# Patient Record
Sex: Female | Born: 1940 | Race: White | Hispanic: No | Marital: Married | Smoking: Former smoker
Health system: Southern US, Community
[De-identification: ages and names within clinical notes are randomized; demographics above are authoritative.]

## PROBLEM LIST (undated history)

## (undated) DIAGNOSIS — C801 Malignant (primary) neoplasm, unspecified: Secondary | ICD-10-CM

## (undated) DIAGNOSIS — M549 Dorsalgia, unspecified: Secondary | ICD-10-CM

## (undated) DIAGNOSIS — E785 Hyperlipidemia, unspecified: Secondary | ICD-10-CM

## (undated) DIAGNOSIS — M810 Age-related osteoporosis without current pathological fracture: Secondary | ICD-10-CM

## (undated) DIAGNOSIS — I482 Chronic atrial fibrillation, unspecified: Secondary | ICD-10-CM

## (undated) DIAGNOSIS — T7840XA Allergy, unspecified, initial encounter: Secondary | ICD-10-CM

## (undated) DIAGNOSIS — Z923 Personal history of irradiation: Secondary | ICD-10-CM

## (undated) DIAGNOSIS — I340 Nonrheumatic mitral (valve) insufficiency: Secondary | ICD-10-CM

## (undated) DIAGNOSIS — E559 Vitamin D deficiency, unspecified: Secondary | ICD-10-CM

## (undated) DIAGNOSIS — H903 Sensorineural hearing loss, bilateral: Secondary | ICD-10-CM

## (undated) DIAGNOSIS — I10 Essential (primary) hypertension: Secondary | ICD-10-CM

## (undated) DIAGNOSIS — I499 Cardiac arrhythmia, unspecified: Secondary | ICD-10-CM

## (undated) HISTORY — DX: Hyperlipidemia, unspecified: E78.5

## (undated) HISTORY — DX: Age-related osteoporosis without current pathological fracture: M81.0

## (undated) HISTORY — PX: OTHER SURGICAL HISTORY: SHX169

## (undated) HISTORY — DX: Nonrheumatic mitral (valve) insufficiency: I34.0

## (undated) HISTORY — PX: CATARACT EXTRACTION: SUR2

## (undated) HISTORY — PX: TONSILLECTOMY: SUR1361

## (undated) HISTORY — DX: Essential (primary) hypertension: I10

## (undated) HISTORY — DX: Vitamin D deficiency, unspecified: E55.9

## (undated) HISTORY — DX: Allergy, unspecified, initial encounter: T78.40XA

## (undated) HISTORY — DX: Dorsalgia, unspecified: M54.9

## (undated) HISTORY — DX: Chronic atrial fibrillation, unspecified: I48.20

## (undated) HISTORY — DX: Sensorineural hearing loss, bilateral: H90.3

---

## 1974-07-02 HISTORY — PX: TUBAL LIGATION: SHX77

## 1996-07-02 DIAGNOSIS — I499 Cardiac arrhythmia, unspecified: Secondary | ICD-10-CM

## 1996-07-02 HISTORY — DX: Cardiac arrhythmia, unspecified: I49.9

## 2011-07-03 DIAGNOSIS — C801 Malignant (primary) neoplasm, unspecified: Secondary | ICD-10-CM

## 2011-07-03 HISTORY — DX: Malignant (primary) neoplasm, unspecified: C80.1

## 2011-07-03 HISTORY — PX: BREAST BIOPSY: SHX20

## 2011-07-03 HISTORY — PX: MASTECTOMY: SHX3

## 2011-10-01 HISTORY — PX: AUGMENTATION MAMMAPLASTY: SUR837

## 2013-05-15 HISTORY — PX: PROXIMAL INTERPHALANGEAL FUSION (PIP): SHX6043

## 2014-12-28 DIAGNOSIS — Z7901 Long term (current) use of anticoagulants: Secondary | ICD-10-CM | POA: Insufficient documentation

## 2014-12-28 DIAGNOSIS — J309 Allergic rhinitis, unspecified: Secondary | ICD-10-CM | POA: Insufficient documentation

## 2014-12-28 DIAGNOSIS — E559 Vitamin D deficiency, unspecified: Secondary | ICD-10-CM | POA: Insufficient documentation

## 2014-12-28 DIAGNOSIS — E785 Hyperlipidemia, unspecified: Secondary | ICD-10-CM | POA: Insufficient documentation

## 2014-12-28 DIAGNOSIS — M810 Age-related osteoporosis without current pathological fracture: Secondary | ICD-10-CM | POA: Insufficient documentation

## 2014-12-28 DIAGNOSIS — I10 Essential (primary) hypertension: Secondary | ICD-10-CM | POA: Insufficient documentation

## 2014-12-28 DIAGNOSIS — I482 Chronic atrial fibrillation, unspecified: Secondary | ICD-10-CM | POA: Insufficient documentation

## 2015-01-11 DIAGNOSIS — I341 Nonrheumatic mitral (valve) prolapse: Secondary | ICD-10-CM | POA: Insufficient documentation

## 2015-01-11 DIAGNOSIS — I10 Essential (primary) hypertension: Secondary | ICD-10-CM | POA: Insufficient documentation

## 2015-01-26 DIAGNOSIS — I071 Rheumatic tricuspid insufficiency: Secondary | ICD-10-CM | POA: Insufficient documentation

## 2015-07-03 DIAGNOSIS — Z923 Personal history of irradiation: Secondary | ICD-10-CM

## 2015-07-03 HISTORY — PX: BREAST LUMPECTOMY: SHX2

## 2015-07-03 HISTORY — DX: Personal history of irradiation: Z92.3

## 2015-07-13 ENCOUNTER — Other Ambulatory Visit: Payer: Self-pay | Admitting: Internal Medicine

## 2015-07-13 ENCOUNTER — Other Ambulatory Visit: Payer: Self-pay | Admitting: *Deleted

## 2015-07-13 ENCOUNTER — Inpatient Hospital Stay
Admission: RE | Admit: 2015-07-13 | Discharge: 2015-07-13 | Disposition: A | Discharge status: Home / Self Care | Payer: Self-pay | Source: Ambulatory Visit | Attending: *Deleted | Admitting: *Deleted

## 2015-07-13 DIAGNOSIS — Z1231 Encounter for screening mammogram for malignant neoplasm of breast: Secondary | ICD-10-CM

## 2015-07-13 DIAGNOSIS — Z9289 Personal history of other medical treatment: Secondary | ICD-10-CM

## 2015-08-03 ENCOUNTER — Other Ambulatory Visit: Payer: Self-pay | Admitting: Internal Medicine

## 2015-08-03 ENCOUNTER — Ambulatory Visit
Admission: RE | Admit: 2015-08-03 | Discharge: 2015-08-03 | Disposition: A | Discharge status: Home / Self Care | Payer: Medicare PPO | Source: Ambulatory Visit | Attending: Internal Medicine | Admitting: Internal Medicine

## 2015-08-03 DIAGNOSIS — Z1231 Encounter for screening mammogram for malignant neoplasm of breast: Secondary | ICD-10-CM | POA: Diagnosis present

## 2015-08-24 ENCOUNTER — Other Ambulatory Visit: Payer: Self-pay | Admitting: Internal Medicine

## 2015-08-24 DIAGNOSIS — R928 Other abnormal and inconclusive findings on diagnostic imaging of breast: Secondary | ICD-10-CM

## 2015-08-30 ENCOUNTER — Ambulatory Visit
Admission: RE | Admit: 2015-08-30 | Discharge: 2015-08-30 | Disposition: A | Discharge status: Home / Self Care | Payer: Medicare PPO | Source: Ambulatory Visit | Attending: Internal Medicine | Admitting: Internal Medicine

## 2015-08-30 DIAGNOSIS — R928 Other abnormal and inconclusive findings on diagnostic imaging of breast: Secondary | ICD-10-CM

## 2015-08-30 DIAGNOSIS — N63 Unspecified lump in breast: Secondary | ICD-10-CM | POA: Insufficient documentation

## 2015-08-30 DIAGNOSIS — R921 Mammographic calcification found on diagnostic imaging of breast: Secondary | ICD-10-CM | POA: Insufficient documentation

## 2015-08-31 HISTORY — PX: BREAST BIOPSY: SHX20

## 2015-09-01 ENCOUNTER — Telehealth: Payer: Self-pay

## 2015-09-01 ENCOUNTER — Other Ambulatory Visit: Payer: Self-pay | Admitting: Surgery

## 2015-09-01 DIAGNOSIS — N63 Unspecified lump in unspecified breast: Secondary | ICD-10-CM

## 2015-09-01 NOTE — Telephone Encounter (Signed)
Call made to [facility] at this time to find out if a Breast Biopsy will be ordered and if patient has been told of abnormal mammogram so that I may contact patient to schedule biopsy and appointment.  Awaiting a call back from Dr. Rhona Leavens nurse at this time.

## 2015-09-01 NOTE — Telephone Encounter (Addendum)
Spoke with Dr. Rhona Leavens nurse, Bridgette at this time. She states that she spoke with the patient yesterday and patient was informed that she would be coming to see surgeon.  Spoke with Dr. Dahlia Byes who will be seeing this patient next week. He would like to have a Biopsy done of this area prior to seeing patient in office. Orders placed.  Call made to [facility] to get Biopsy scheduled. Spoke with [employer]. She has scheduled biopsy to be done at 1330 tomorrow at [facility].  Called patient whom informs me that she is currently taking Warfarin which I was unaware of. Called Denise back, she is going to speak with e radiologist to see if this can be done or if clearance will have to be obtained from Dr. Nehemiah Massed to remove patient from Warfarin and then complete biopsy.

## 2015-09-02 ENCOUNTER — Ambulatory Visit
Admission: RE | Admit: 2015-09-02 | Discharge: 2015-09-02 | Disposition: A | Discharge status: Home / Self Care | Payer: Medicare PPO | Source: Ambulatory Visit | Attending: Surgery | Admitting: Surgery

## 2015-09-02 ENCOUNTER — Other Ambulatory Visit: Payer: Self-pay | Admitting: Surgery

## 2015-09-02 DIAGNOSIS — N63 Unspecified lump in unspecified breast: Secondary | ICD-10-CM

## 2015-09-02 DIAGNOSIS — D0591 Unspecified type of carcinoma in situ of right breast: Secondary | ICD-10-CM | POA: Insufficient documentation

## 2015-09-02 DIAGNOSIS — R928 Other abnormal and inconclusive findings on diagnostic imaging of breast: Secondary | ICD-10-CM | POA: Diagnosis present

## 2015-09-02 NOTE — Telephone Encounter (Signed)
Received a phone call back from [facility]) and she has spoken with Dr. Glennon Mac and Dr. Josephine Igo (Radiologists). They feel like patient is ok to have biopsy on Coumadin given size and location of mass. Patient scheduled to have biopsy at 1330 today as originally planned.  Called patient, she is fine with having this done on coumadin after she knows that we have gotten the ok from the radiologists. She will follow-up in the office with Dr. Dahlia Byes next week to discuss pathology and plan for surgery.

## 2015-09-05 ENCOUNTER — Telehealth: Payer: Self-pay

## 2015-09-05 LAB — SURGICAL PATHOLOGY

## 2015-09-05 NOTE — Telephone Encounter (Signed)
Received call from Dr. Reuel Derby from Pathology. 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 HER2/ERPR are currently ordered and pending.  Went over these results via phone with Dr. Azalee Course. We will follow-up with patient in office as scheduled on Wednesday at 2pm. Will await Marker results in the meantime.

## 2015-09-07 ENCOUNTER — Encounter: Payer: Self-pay | Admitting: Surgery

## 2015-09-07 ENCOUNTER — Ambulatory Visit (INDEPENDENT_AMBULATORY_CARE_PROVIDER_SITE_OTHER): Payer: Medicare PPO | Admitting: Surgery

## 2015-09-07 VITALS — BP 118/69 | HR 92 | Temp 97.9°F | Ht 63.0 in | Wt 178.0 lb

## 2015-09-07 DIAGNOSIS — D0511 Intraductal carcinoma in situ of right breast: Secondary | ICD-10-CM

## 2015-09-07 NOTE — Patient Instructions (Addendum)
Our next step is to speak with Dr. Nehemiah Massed regarding your anticoagulant and cardiac clearance.   We will be in touch after talking to him and then we will arrange your surgery on 09/27/15 with Dr. Azalee Course.  Please see the (blue)pre-care form that you have been given today.

## 2015-09-08 ENCOUNTER — Encounter: Payer: Self-pay | Admitting: Surgery

## 2015-09-08 DIAGNOSIS — D0511 Intraductal carcinoma in situ of right breast: Secondary | ICD-10-CM | POA: Insufficient documentation

## 2015-09-08 DIAGNOSIS — D0512 Intraductal carcinoma in situ of left breast: Secondary | ICD-10-CM | POA: Insufficient documentation

## 2015-09-08 NOTE — Progress Notes (Signed)
Subjective:     Patient ID: Sierra Green, female   DOB: Feb 11, 1941, 75 y.o.   MRN: [id number]  HPI  75 year old female who comes in today with a new finding of right breast DCIS and on routine mammogram screening. 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 Patient has not noticed any skin changes nipple dimpling breast masses or any breast pain. Patient does not have any family history of breast cancer or other kinds of cancers. Patient has not had any other biopsies other than the one in 2013. Patient did have 2 children started at the age of 82. Patient did breast-feed. Patient was on hormone replacement therapy for one year however stopped. She did not use any birth control due to history of a blood clot in her right eye.  Past Medical History  Diagnosis Date  . Allergy     Seasonal  . Back pain   . Chronic atrial fibrillation (HCC)     s/p 3 ablations, multiple cardioversions  . Hyperlipidemia   . Hypertension   . Osteoporosis   . Mitral valve regurgitation   . Sensorineural hearing loss (SNHL) of both ears     worst on right  . Vitamin D deficiency    Past Surgical History  Procedure Laterality Date  . Mastectomy Left 07/2011    DCIS- [place]  . Augmentation mammaplasty Bilateral 10/2011  . Tonsillectomy  as child  . Tubal ligation Bilateral 1976    Laparoscopic  . Proximal interphalangeal fusion (pip) Right 05/15/2013    2nd, 3rd, and 5th digits  . Cataract extraction Bilateral Right- 05/2014; Left-06/2014   Family History  Problem Relation Age of Onset  . Breast cancer Neg Hx    Social History   Social History  . Marital Status: Unknown    Spouse Name: N/A  . Number of Children: N/A  . Years of Education: N/A   Social History Main Topics  . Smoking status: Former Smoker    Quit date: 07/02/1966  . Smokeless tobacco:  Never Used  . Alcohol Use: Yes     Comment: Glass of wine each night  . Drug Use: No  . Sexual Activity: Not Asked   Other Topics Concern  . None   Social History Narrative    Current outpatient prescriptions:  .  Cholecalciferol (VITAMIN D3) 5000 units TABS, Take 1 tablet by mouth daily. For 5 days per week, Disp: , Rfl:  .  diltiazem (TAZTIA XT) 120 MG 24 hr capsule, Take 1 tablet by mouth 2 (two) times daily., Disp: , Rfl:  .  fexofenadine (ALLEGRA) 180 MG tablet, Take 1 tablet by mouth daily as needed., Disp: , Rfl:  .  Red Yeast Rice Extract 600 MG CAPS, Take 1 tablet by mouth daily., Disp: , Rfl:  .  vitamin C (ASCORBIC ACID) 500 MG tablet, Take 1 tablet by mouth every other day., Disp: , Rfl:  .  warfarin (COUMADIN) 5 MG tablet, Take by mouth daily. 7.5mg  on Monday and Friday; 5mg  all other days, Disp: , Rfl:  Allergies  Allergen Reactions  . Oxycodone Anaphylaxis  . Zoledronic Acid Other (See Comments)    Iritis  . Other Other (See Comments)    Flu like symptoms  . Alendronate Other (See Comments)    Flu like symptoms  . Honey Bee Venom Swelling    Marked  local swelling       Review of Systems  Constitutional: Negative for fever, activity change, appetite change and unexpected weight change.  HENT: Negative for congestion and sore throat.   Respiratory: Negative for apnea and wheezing.   Cardiovascular: Negative for chest pain, palpitations and leg swelling.  Gastrointestinal: Negative for nausea, abdominal pain, diarrhea and abdominal distention.  Genitourinary: Negative for hematuria and difficulty urinating.  Musculoskeletal: Negative for back pain and joint swelling.  Skin: Negative for color change, pallor, rash and wound.  Neurological: Negative for dizziness and weakness.  Hematological: Negative for adenopathy. Does not bruise/bleed easily.  Psychiatric/Behavioral: Negative for agitation. The patient is not nervous/anxious.   All other systems reviewed  and are negative.      Filed Vitals:   09/07/15 1407  BP: 118/69  Pulse: 92  Temp: 97.9 F (36.6 C)    Objective:   Physical Exam  Constitutional: She is oriented to person, place, and time. She appears well-developed and well-nourished.  HENT:  Head: Normocephalic and atraumatic.  Right Ear: External ear normal.  Nose: Nose normal.  Mouth/Throat: Oropharynx is clear and moist. No oropharyngeal exudate.  Eyes: Conjunctivae and EOM are normal. Pupils are equal, round, and reactive to light. No scleral icterus.  Neck: Normal range of motion. Neck supple. No tracheal deviation present.  Cardiovascular: Normal rate and normal heart sounds.   No murmur heard. Irregular rhythm  Pulmonary/Chest: Effort normal and breath sounds normal. No respiratory distress. She has no wheezes. She has no rales.  Abdominal: Soft. Bowel sounds are normal. She exhibits no distension. There is no tenderness.  Musculoskeletal: Normal range of motion. She exhibits no edema or tenderness.  Neurological: She is alert and oriented to person, place, and time. No cranial nerve deficit.  Skin: Skin is warm and dry. No rash noted. No erythema. No pallor.  Psychiatric: She has a normal mood and affect. Her behavior is normal. Judgment and thought content normal.  Vitals reviewed. Breast Exam: Right breast with well-healed surgical scars, some bruising from biopsy site, no masses palpable no skin retraction no nipple discharge, no cervical or axillary lymphadenopathy.  Left breast with well-healed surgical scars some capsular contraction causing slight elevation of this rest compared to the other. No skin changes nipple dimpling or skin retraction. No masses palpable. No cervical or axillary lymphadenopathy.     Assessment:     75 year old female with right breast DCIS    Plan:     I discussed the available options with the patient and husband. I explained that DCIS is no diagnosis of cancer however could  potentially turn into that. I also discussed that in 30% of DCIS cases there could be a invasive cancer that just didn't show in the initial biopsy. The risk of recurrence is similar between mastectomy and lumpectomy with potentially radiation. I also discussed that given the small size of the DCIS would recommend a needle localization lumpectomy with likely radiation to follow. Explained to the patient that after her surgical treatment she may need to take an estrogen blocking medication but that would be determined by the tumor markers that were still being run.   I discussed risks of bleeding, infection, damage to surrounding tissues, having positive margins, needing further resection, damage to nerves causing arm numbness or difficulty raising arm, causing lymphoedema in the arm; as well as anesthesia risks of MI, stroke, prolonged ventilation, pulmonary embolism, thrombosis and even death.I also discussed with the patient and family that if  this did come back as positive for a cancer that we would need to do a second operation to get a sample of the lymph nodes. Patient was given the opportunity to ask questions and have them answered. They would like to proceed with Right breast needle localized lumpectomy.

## 2015-09-09 ENCOUNTER — Other Ambulatory Visit: Payer: Self-pay | Admitting: Surgery

## 2015-09-09 ENCOUNTER — Other Ambulatory Visit: Payer: Self-pay

## 2015-09-09 ENCOUNTER — Telehealth: Payer: Self-pay

## 2015-09-09 DIAGNOSIS — D0511 Intraductal carcinoma in situ of right breast: Secondary | ICD-10-CM

## 2015-09-09 NOTE — Telephone Encounter (Signed)
Spoke with Dr. Azalee Course at this time regarding this patient. She states that she has spoken with Dr. Reuel Derby (pathologist) and now feels like a Sentinel Node Biopsy needs to be done during scheduled lumpectomy.  Call made to patient. Explained situation above. Patient is completely ok with Dr. Sentinel Node Biopsy at the time of surgery.  Orders for consent changed. Order for Needle Localization has been placed.

## 2015-09-09 NOTE — Telephone Encounter (Signed)
error 

## 2015-09-12 ENCOUNTER — Other Ambulatory Visit: Payer: Self-pay | Admitting: Surgery

## 2015-09-12 ENCOUNTER — Telehealth: Payer: Self-pay | Admitting: Surgery

## 2015-09-12 ENCOUNTER — Encounter: Payer: Self-pay | Admitting: Surgery

## 2015-09-12 DIAGNOSIS — D0511 Intraductal carcinoma in situ of right breast: Secondary | ICD-10-CM

## 2015-09-12 NOTE — Telephone Encounter (Signed)
Clearance to be taken off of Coumadin is currently in Process with Dr. Alveria Apley office.

## 2015-09-12 NOTE — Telephone Encounter (Signed)
Pt advised of pre op date/time and sx date. Sx: 09/27/15 with Dr Carole Binning breast lumpectomy. Pre op: 09/19/15 @ 1:00pm.  Patient made aware to arrive at Radiology at the [facility] at 8:00am the day of surgery for there Sentinel Node Injection. Radiology [facility] take [facility] Needle Loc and she will then be sent to Same Day Surgery.   Patient is on warfarin (COUMADIN) 5 MG tablet . She will need clearance from Dr Nehemiah Massed to be taken off this. Please contact patient with this information.

## 2015-09-15 ENCOUNTER — Telehealth: Payer: Self-pay | Admitting: Surgery

## 2015-09-15 NOTE — Telephone Encounter (Signed)
Patient was called at this time and understands everything below regarding her Coumadin. Will call with any further questions or concerns.

## 2015-09-15 NOTE — Telephone Encounter (Signed)
Clearance has been obtained by Dr. Nehemiah Massed. Notes in the assessment and plan in Care Everywhere from today's office visit indicate that we may proceed with surgery and patient will be taken off of Coumadin 4 days prior to surgery.  Surgery is scheduled for 09/27/15. Patient will need to hold Coumadin from 3/24-3/27.

## 2015-09-15 NOTE — Telephone Encounter (Signed)
Clearance has been received per Dr. Alveria Apley nurse. However, patient needed an appointment to be cleared. Patient is being seen today at 11:15am and this will be addressed at appointment and sent back via fax.

## 2015-09-15 NOTE — Telephone Encounter (Signed)
I scanned to clinical a Health Help form for EXBI of the Breast that was submitted on 3/15. It should be in her chart within 24 hours.

## 2015-09-19 ENCOUNTER — Encounter
Admission: RE | Admit: 2015-09-19 | Discharge: 2015-09-19 | Disposition: A | Discharge status: Home / Self Care | Payer: Medicare PPO | Source: Ambulatory Visit | Attending: Surgery | Admitting: Surgery

## 2015-09-19 ENCOUNTER — Other Ambulatory Visit: Payer: Self-pay

## 2015-09-19 DIAGNOSIS — Z01812 Encounter for preprocedural laboratory examination: Secondary | ICD-10-CM | POA: Diagnosis not present

## 2015-09-19 DIAGNOSIS — D0511 Intraductal carcinoma in situ of right breast: Secondary | ICD-10-CM

## 2015-09-19 HISTORY — DX: Cardiac arrhythmia, unspecified: I49.9

## 2015-09-19 HISTORY — DX: Malignant (primary) neoplasm, unspecified: C80.1

## 2015-09-19 LAB — CBC WITH DIFFERENTIAL/PLATELET
BASOS PCT: 1 %
Basophils Absolute: 0 10*3/uL (ref 0–0.1)
EOS ABS: 0.2 10*3/uL (ref 0–0.7)
EOS PCT: 3 %
HCT: 39.7 % (ref 35.0–47.0)
Hemoglobin: 13.4 g/dL (ref 12.0–16.0)
LYMPHS ABS: 1.6 10*3/uL (ref 1.0–3.6)
Lymphocytes Relative: 22 %
MCH: 30.9 pg (ref 26.0–34.0)
MCHC: 33.7 g/dL (ref 32.0–36.0)
MCV: 91.8 fL (ref 80.0–100.0)
MONOS PCT: 10 %
Monocytes Absolute: 0.7 10*3/uL (ref 0.2–0.9)
NEUTROS PCT: 66 %
Neutro Abs: 4.9 10*3/uL (ref 1.4–6.5)
PLATELETS: 213 10*3/uL (ref 150–440)
RBC: 4.33 MIL/uL (ref 3.80–5.20)
RDW: 14 % (ref 11.5–14.5)
WBC: 7.5 10*3/uL (ref 3.6–11.0)

## 2015-09-19 LAB — COMPREHENSIVE METABOLIC PANEL
ALBUMIN: 4.4 g/dL (ref 3.5–5.0)
ALT: 16 U/L (ref 14–54)
ANION GAP: 6 (ref 5–15)
AST: 25 U/L (ref 15–41)
Alkaline Phosphatase: 67 U/L (ref 38–126)
BUN: 18 mg/dL (ref 6–20)
CHLORIDE: 104 mmol/L (ref 101–111)
CO2: 26 mmol/L (ref 22–32)
Calcium: 9.3 mg/dL (ref 8.9–10.3)
Creatinine, Ser: 0.63 mg/dL (ref 0.44–1.00)
GFR calc non Af Amer: >60 mL/min (ref >60)
GLUCOSE: 98 mg/dL (ref 65–99)
POTASSIUM: 4.4 mmol/L (ref 3.5–5.1)
SODIUM: 136 mmol/L (ref 135–145)
Total Bilirubin: 0.9 mg/dL (ref 0.3–1.2)
Total Protein: 7.3 g/dL (ref 6.5–8.1)

## 2015-09-19 LAB — APTT: aPTT: 33 seconds (ref 24–36)

## 2015-09-19 LAB — PROTIME-INR
INR: 2.48
Prothrombin Time: 26.5 seconds — ABNORMAL HIGH (ref 11.4–15.0)

## 2015-09-19 NOTE — Telephone Encounter (Signed)
error 

## 2015-09-19 NOTE — Pre-Procedure Instructions (Addendum)
Flossie Dibble, MD - 09/15/2015 11:45 AM EDT  Plan  -Proceed to surgery and/or invasive procedure without restriction to pre or post operative and/or procedural care. The patient is at lowest risk possible for cardiovascular complications with surgical intervention and/or invasive procedure. Currently has no evidence active and/or significant angina and/or congestive heart failure. The patient may discontinue Coumadin 4 days prior to procedure and restart at a safe period thereafter -diltiazem (Cardizem) for heart rate control of atrial fibrillation and/or maintenance of normal sinus rhythm watching closely for any other significant side effects of medications and/or other rhythm disturbances. The patient does understand current treatment goals  -Hypertension medication management listed above has been reviewed and discussed with the patient. We will continue current medical regimen at this time for reduction of risk of cardiovascular disease. The patient will report any new or future change of blood pressure for need in potential treatment changes. -Continue to monitor mitral valve prolapse very closely for any new onset of symptoms including palpitations and irregular heartbeat atrial fibrillation and preventricular contractions and shortness of breath. The patient is currently not required to use antibiotics with teeth cleanings or other intervention.  Orders Placed This Encounter  Procedures  . ECG 12-lead   Return in about 6 months (around 03/17/2016).  Flossie Dibble, MD

## 2015-09-19 NOTE — Pre-Procedure Instructions (Signed)
Echocardiogram stress test7/27/2016  [facility]  Echocardiogram stress test7/27/2016  [facility]  Result Narrative                                                                                   Version 2                       [place] [place] [facility]                       [id number]           [place], [place],  [id number]               Acct #: [id number]                                                                  Date: 01/26/2015 01:06 PM                    ECHOCARDIOGRAM REPORT                         Adult Female Age: 75 yrs           STUDY:Stress Echo                      TAPE:           MD1:            ECHO:Yes   DOPPLER:Yes                FILE:           Height: 71 in           COLOR:Yes  CONTRAST:No      MACHINE:Accuson            Weight: 137 lb       RV BIOPSY:No         3D:No   SOUND QLTY:Moderate          MEDIUM:None                                             BSA: 1.6 m2 ___________________________________________________________________________________________                REASON:Assess, LV function           Indication:I48.2 (ICD-10-CM) - Chronic a-fib ___________________________________________________________________________________________  STRESS ECHOCARDIOGRAPHY  Protocol:Treadmill ([place]ruce)             Drugs:None Target Heart Rate:124 bpm        Maximum Predicted Heart Rate: 146 bpm  +-------------------+-------------------------+-------------------------+------------+        Stage            Duration (mm:ss)         Heart Rate (bpm)         BP      +-------------------+-------------------------+-------------------------+------------+         rest                 18:18                      90              128/76    +-------------------+-------------------------+-------------------------+------------+        stages                  7:42                     148                /       +-------------------+-------------------------+-------------------------+------------+         post                  6:57                      86              124/75    +-------------------+-------------------------+-------------------------+------------+    Stress Duration:7:42 mm:ss *   Max Stress H.R.:148 bpm *        Target Heart Rate Achieved: Yes Maximum workload of 10.10 METS was achieved during exercise  ___________________________________________________________________________________________  WALL SEGMENT CHANGES                        Rest          Stress Anterior Septum Basal:Normal        Hyperkinetic                   [id number]        Hyperkinetic                Apical:Normal        Hyperkinetic    Anterior Wall Basal:Normal        Hyperkinetic                   [id number]        Hyperkinetic                Apical:Normal        Hyperkinetic     Lateral Wall Basal:Normal        Hyperkinetic                   [id number]        Hyperkinetic                Apical:Normal        Hyperkinetic   Posterior Wall Basal:Normal        Hyperkinetic                   [id number]        Hyperkinetic    Inferior Wall Basal:Normal  Hyperkinetic                   [id number]        Hyperkinetic                Apical:Normal        Hyperkinetic  Inferior Septum Basal:Normal        Hyperkinetic                   [id number]        Hyperkinetic             Resting EF:>55% (Est.)   Stress EF: >55% (Est.)  ___________________________________________________________________________________________  ADDITIONAL FINDINGS    Chest Discomfort:None         Arrhythmia:None Termination Reason:Fatigue    Adverse Effects:None      Complications:None  ___________________________________________________________________________________________ STRESS ECG RESULTS                                               ECG  Results:Normal   ___________________________________________________________________________________________ ECHOCARDIOGRAPHIC DESCRIPTIONS  LEFT VENTRICLE         Size:Normal  Contraction:Normal    LV Masses:No Masses          [id number] LVH Dias.FxClass:Normal  RIGHT VENTRICLE         Size:Normal               Free Wall:Normal  Contraction:Normal               RV Masses:No mass  PERICARDIUM        Fluid:No effusion   _______________________________________________________________________________________ DOPPLER ECHO and OTHER SPECIAL PROCEDURES    Aortic:MILD AR                    No AS           187.0 cm/sec peak vel      14.0 mmHg peak grad     Mitral:MODERATE MR                No MS           MV Inflow E Vel=nm*        MV Annulus E'Vel=nm*           E/E'Ratio=nm*  Tricuspid:MILD TR                    No TS           238.3 cm/sec peak TR vel   32.8 mmHg peak RV pressure  Pulmonary:MILD PR                    No PS           89.2 cm/sec peak vel       3.2 mmHg peak grad    ___________________________________________________________________________________________ ECHOCARDIOGRAPHIC MEASUREMENTS 2D DIMENSIONS AORTA             Values      Normal Range      MAIN PA          Values      Normal Range           Annulus:  nm*       [2.1 - 2.5]                PA Main:  nm*       [  1.5 - 2.1]         Aorta Sin:  nm*       [2.7 - 3.3]       RIGHT VENTRICLE       ST Junction:  nm*       [2.3 - 2.9]                RV Base:  nm*       [ < 4.2]         Asc.Aorta:  nm*       [2.3 - 3.1]                 RV Mid:  nm*       [ < 3.5]  LEFT VENTRICLE                                        RV Length:  nm*       [ < 8.6]             LVIDd:  3.9 cm    [3.9 - 5.3]       INFERIOR VENA CAVA             LVIDs:  2.6 cm                              Max. IVC:  nm*       [ <= 2.1]                FS:  33.3 %    [> 25]                    Min. IVC:  nm*               SWT:  1.2 cm    [0.5 - 0.9]                    ------------------               PWT:  0.91 cm   [0.5 - 0.9]                   nm* - not measured  LEFT ATRIUM           LA Diam:  4.9 cm    [2.7 - 3.8]       LA A4C Area:  nm*       [ < 20]         LA Volume:  nm*       [22 - 52]  ___________________________________________________________________________________________  INTERPRETATION Normal Stress Echocardiogram   ___________________________________________________________________________________________ Electronically signed by: MD Serafina Royals on 02/17/2015 08:36 AM             Performed By: Maurilio Lovely, [place]       Ordering Physician: Nehemiah Massed, MD Darnell Level

## 2015-09-19 NOTE — Pre-Procedure Instructions (Signed)
Noted Anesthesia consult regarding A-fib, cardiac consult obtained low risk from Dr. Nehemiah Massed.  Contacted Dr. Andree Elk regarding anesthesia consult, ok to proceed with out anesthesia needing to see pt as long as we have cardiac clearance.

## 2015-09-19 NOTE — Patient Instructions (Signed)
  Your procedure is scheduled on: Tuesday September 27, 2015. Report to Registration desk at 8:00am   Remember: Instructions that are not followed completely may result in serious medical risk, up to and including death, or upon the discretion of your surgeon and anesthesiologist your surgery may need to be rescheduled.    _x___ 1. Do not eat food or drink liquids after midnight. No gum chewing or hard candies.     _x___ 2. No Alcohol for 24 hours before or after surgery.   ____ 3. Bring all medications with you on the day of surgery if instructed.    __x__ 4. Notify your doctor if there is any change in your medical condition     (cold, fever, infections).     Do not wear jewelry, make-up, hairpins, clips or nail polish.  Do not wear lotions, powders, or perfumes. You may wear deodorant.  Do not shave 48 hours prior to surgery. Men may shave face and neck.  Do not bring valuables to the hospital.    [facility] is not responsible for any belongings or valuables.               Contacts, dentures or bridgework may not be worn into surgery.  Leave your suitcase in the car. After surgery it may be brought to your room.  For patients admitted to the hospital, discharge time is determined by your treatment team.   Patients discharged the day of surgery will not be allowed to drive home.    Please read over the following fact sheets that you were given:   [facility] Preparing for Surgery  ____ Take these medicines the morning of surgery with A SIP OF WATER:    1. diltiazem (TAZTIA XT)    ____ Fleet Enema (as directed)   _x___ Use CHG Soap as directed  ____ Use inhalers on the day of surgery  ____ Stop metformin 2 days prior to surgery    ____ Take 1/2 of usual insulin dose the night before surgery and none on the morning of surgery.   __x__ Stop  Coumadin on Friday September 23, 2015 per Dr. Alveria Apley instructions.  __x__ Stop Anti-inflammatories on does not apply.  Tylenol is ok  to take for pain.   __x__ Stop supplements vitamins and red yeast rice until after surgery.    ____ Bring C-Pap to the hospital.

## 2015-09-22 ENCOUNTER — Encounter: Payer: Self-pay | Admitting: *Deleted

## 2015-09-22 NOTE — Progress Notes (Signed)
  Oncology Nurse Navigator Documentation  Navigator Location: [facility] (09/22/15 1400) Navigator Encounter Type: Introductory phone call (09/22/15 1400)   Abnormal Finding Date: 08/30/15 (09/22/15 1400) Confirmed Diagnosis Date: 09/05/15 (09/22/15 1400) Surgery Date: 09/27/15 (09/22/15 1400) Treatment Initiated Date: 09/27/15 (09/22/15 1400)     Barriers/Navigation Needs: Education (09/22/15 1400) Education: Coping with Diagnosis/ Prognosis;Understanding Cancer/ Treatment Options (09/22/15 1400)              Acuity: Level 2 (09/22/15 1400)         Time Spent with Patient: 30 (09/22/15 1400)   Talked to patient today and introduced to navigation services.  Will take educational literature to patient the day of her surgery per her request.  She is scheduled for surgery on 08/30/15.  She is to call if she has any questions or needs.

## 2015-09-27 ENCOUNTER — Encounter
Admission: RE | Disposition: A | Discharge status: Home / Self Care | Payer: Self-pay | Source: Ambulatory Visit | Attending: Surgery

## 2015-09-27 ENCOUNTER — Ambulatory Visit: Payer: Medicare PPO | Admitting: Certified Registered Nurse Anesthetist

## 2015-09-27 ENCOUNTER — Ambulatory Visit
Admission: RE | Admit: 2015-09-27 | Discharge: 2015-09-27 | Disposition: A | Discharge status: Home / Self Care | Payer: Medicare PPO | Source: Ambulatory Visit | Attending: Surgery | Admitting: Surgery

## 2015-09-27 ENCOUNTER — Encounter: Payer: Self-pay | Admitting: *Deleted

## 2015-09-27 DIAGNOSIS — D0581 Other specified type of carcinoma in situ of right breast: Secondary | ICD-10-CM | POA: Diagnosis not present

## 2015-09-27 DIAGNOSIS — N6091 Unspecified benign mammary dysplasia of right breast: Secondary | ICD-10-CM | POA: Insufficient documentation

## 2015-09-27 DIAGNOSIS — D0511 Intraductal carcinoma in situ of right breast: Secondary | ICD-10-CM | POA: Diagnosis not present

## 2015-09-27 DIAGNOSIS — H905 Unspecified sensorineural hearing loss: Secondary | ICD-10-CM | POA: Diagnosis not present

## 2015-09-27 DIAGNOSIS — I482 Chronic atrial fibrillation: Secondary | ICD-10-CM | POA: Diagnosis not present

## 2015-09-27 DIAGNOSIS — I10 Essential (primary) hypertension: Secondary | ICD-10-CM | POA: Diagnosis not present

## 2015-09-27 DIAGNOSIS — Z91030 Bee allergy status: Secondary | ICD-10-CM | POA: Diagnosis not present

## 2015-09-27 DIAGNOSIS — Z888 Allergy status to other drugs, medicaments and biological substances status: Secondary | ICD-10-CM | POA: Insufficient documentation

## 2015-09-27 DIAGNOSIS — Z9842 Cataract extraction status, left eye: Secondary | ICD-10-CM | POA: Insufficient documentation

## 2015-09-27 DIAGNOSIS — I340 Nonrheumatic mitral (valve) insufficiency: Secondary | ICD-10-CM | POA: Diagnosis not present

## 2015-09-27 DIAGNOSIS — Z79899 Other long term (current) drug therapy: Secondary | ICD-10-CM | POA: Insufficient documentation

## 2015-09-27 DIAGNOSIS — E559 Vitamin D deficiency, unspecified: Secondary | ICD-10-CM | POA: Diagnosis not present

## 2015-09-27 DIAGNOSIS — Z885 Allergy status to narcotic agent status: Secondary | ICD-10-CM | POA: Diagnosis not present

## 2015-09-27 DIAGNOSIS — E785 Hyperlipidemia, unspecified: Secondary | ICD-10-CM | POA: Diagnosis not present

## 2015-09-27 DIAGNOSIS — Z87891 Personal history of nicotine dependence: Secondary | ICD-10-CM | POA: Insufficient documentation

## 2015-09-27 DIAGNOSIS — Z9841 Cataract extraction status, right eye: Secondary | ICD-10-CM | POA: Insufficient documentation

## 2015-09-27 DIAGNOSIS — Z7901 Long term (current) use of anticoagulants: Secondary | ICD-10-CM | POA: Diagnosis not present

## 2015-09-27 DIAGNOSIS — N63 Unspecified lump in unspecified breast: Secondary | ICD-10-CM

## 2015-09-27 DIAGNOSIS — M810 Age-related osteoporosis without current pathological fracture: Secondary | ICD-10-CM | POA: Insufficient documentation

## 2015-09-27 DIAGNOSIS — Z981 Arthrodesis status: Secondary | ICD-10-CM | POA: Insufficient documentation

## 2015-09-27 DIAGNOSIS — M549 Dorsalgia, unspecified: Secondary | ICD-10-CM | POA: Insufficient documentation

## 2015-09-27 HISTORY — PX: BREAST LUMPECTOMY WITH NEEDLE LOCALIZATION: SHX5759

## 2015-09-27 HISTORY — PX: SENTINEL NODE BIOPSY: SHX6608

## 2015-09-27 LAB — PROTIME-INR
INR: 1.17
PROTHROMBIN TIME: 15.1 s — AB (ref 11.4–15.0)

## 2015-09-27 SURGERY — BREAST LUMPECTOMY WITH NEEDLE LOCALIZATION
Anesthesia: General | Laterality: Right | Wound class: Clean

## 2015-09-27 MED ORDER — FENTANYL CITRATE (PF) 100 MCG/2ML IJ SOLN: 25.0000 ug | INTRAMUSCULAR | Status: DC | PRN

## 2015-09-27 MED ORDER — CHLORHEXIDINE GLUCONATE 4 % EX LIQD: 1.0000 "application " | Freq: Once | CUTANEOUS | Status: DC

## 2015-09-27 MED ORDER — DEXTROSE 5 % IV SOLN
2.0000 g | INTRAVENOUS | Status: AC
  Administered 2015-09-27: 2 g via INTRAVENOUS
  Filled 2015-09-27: qty 20

## 2015-09-27 MED ORDER — FAMOTIDINE 20 MG PO TABS
20.0000 mg | ORAL_TABLET | Freq: Once | ORAL | Status: AC
  Administered 2015-09-27: 20 mg via ORAL

## 2015-09-27 MED ORDER — LACTATED RINGERS IV SOLN
INTRAVENOUS | Status: DC
  Administered 2015-09-27: 14:00:00 via INTRAVENOUS

## 2015-09-27 MED ORDER — BUPIVACAINE-EPINEPHRINE (PF) 0.5% -1:200000 IJ SOLN
INTRAMUSCULAR | Status: DC | PRN
  Administered 2015-09-27: 20 mL via PERINEURAL

## 2015-09-27 MED ORDER — DEXAMETHASONE SODIUM PHOSPHATE 10 MG/ML IJ SOLN
INTRAMUSCULAR | Status: DC | PRN
  Administered 2015-09-27: 10 mg via INTRAVENOUS

## 2015-09-27 MED ORDER — EPHEDRINE SULFATE 50 MG/ML IJ SOLN
INTRAMUSCULAR | Status: DC | PRN
  Administered 2015-09-27 (×2): 5 mg via INTRAVENOUS

## 2015-09-27 MED ORDER — ACETAMINOPHEN 10 MG/ML IV SOLN
INTRAVENOUS | Status: DC | PRN
  Administered 2015-09-27: 1000 mg via INTRAVENOUS

## 2015-09-27 MED ORDER — METHYLENE BLUE 0.5 % INJ SOLN
INTRAVENOUS | Status: AC
Start: 2015-09-27 — End: 2015-09-27
  Filled 2015-09-27: qty 10

## 2015-09-27 MED ORDER — PROPOFOL 10 MG/ML IV BOLUS
INTRAVENOUS | Status: DC | PRN
  Administered 2015-09-27: 150 mg via INTRAVENOUS

## 2015-09-27 MED ORDER — ACETAMINOPHEN 10 MG/ML IV SOLN
INTRAVENOUS | Status: AC
  Filled 2015-09-27: qty 100

## 2015-09-27 MED ORDER — METHYLENE BLUE 0.5 % INJ SOLN
INTRAVENOUS | Status: DC | PRN
  Administered 2015-09-27: 5 mL via INTRADERMAL

## 2015-09-27 MED ORDER — BUPIVACAINE-EPINEPHRINE (PF) 0.5% -1:200000 IJ SOLN
INTRAMUSCULAR | Status: AC
Start: 2015-09-27 — End: 2015-09-27
  Filled 2015-09-27: qty 30

## 2015-09-27 MED ORDER — PHENYLEPHRINE HCL 10 MG/ML IJ SOLN
INTRAMUSCULAR | Status: DC | PRN
  Administered 2015-09-27 (×11): 100 ug via INTRAVENOUS

## 2015-09-27 MED ORDER — FENTANYL CITRATE (PF) 100 MCG/2ML IJ SOLN
INTRAMUSCULAR | Status: DC | PRN
Start: 2015-09-27 — End: 2015-09-27
  Administered 2015-09-27 (×4): 50 ug via INTRAVENOUS

## 2015-09-27 MED ORDER — TRAMADOL HCL 50 MG PO TABS: 50.0000 mg | ORAL_TABLET | Freq: Four times a day (QID) | ORAL | Status: DC | PRN

## 2015-09-27 MED ORDER — ONDANSETRON HCL 4 MG/2ML IJ SOLN
INTRAMUSCULAR | Status: DC | PRN
  Administered 2015-09-27: 4 mg via INTRAVENOUS

## 2015-09-27 MED ORDER — SODIUM CHLORIDE 0.9 % IJ SOLN
INTRAMUSCULAR | Status: AC
  Filled 2015-09-27: qty 50

## 2015-09-27 MED ORDER — MIDAZOLAM HCL 2 MG/2ML IJ SOLN
INTRAMUSCULAR | Status: DC | PRN
  Administered 2015-09-27 (×2): 1 mg via INTRAVENOUS

## 2015-09-27 MED ORDER — FAMOTIDINE 20 MG PO TABS
ORAL_TABLET | ORAL | Status: AC
  Administered 2015-09-27: 20 mg via ORAL
  Filled 2015-09-27: qty 1

## 2015-09-27 MED ORDER — TECHNETIUM TC 99M SULFUR COLLOID
1.0250 | Freq: Once | INTRAVENOUS | Status: AC | PRN
  Administered 2015-09-27: 1.025 via INTRAVENOUS

## 2015-09-27 MED ORDER — ONDANSETRON HCL 4 MG/2ML IJ SOLN: 4.0000 mg | Freq: Once | INTRAMUSCULAR | Status: DC | PRN

## 2015-09-27 MED ORDER — LACTATED RINGERS IV SOLN
INTRAVENOUS | Status: DC
  Administered 2015-09-27: 10:00:00 via INTRAVENOUS

## 2015-09-27 MED ORDER — LIDOCAINE HCL (CARDIAC) 20 MG/ML IV SOLN
INTRAVENOUS | Status: DC | PRN
  Administered 2015-09-27: 80 mg via INTRAVENOUS

## 2015-09-27 SURGICAL SUPPLY — 39 items
BLADE SURG 15 STRL LF DISP TIS (BLADE) ×1 IMPLANT
BLADE SURG 15 STRL SS (BLADE) ×2
CANISTER SUCT 1200ML W/VALVE (MISCELLANEOUS) ×3 IMPLANT
CHLORAPREP W/TINT 26ML (MISCELLANEOUS) ×3 IMPLANT
CNTNR SPEC 2.5X3XGRAD LEK (MISCELLANEOUS) ×1
CONT SPEC 4OZ STER OR WHT (MISCELLANEOUS) ×2
CONTAINER SPEC 2.5X3XGRAD LEK (MISCELLANEOUS) ×1 IMPLANT
DEVICE DUBIN SPECIMEN MAMMOGRA (MISCELLANEOUS) ×3 IMPLANT
DRAPE LAPAROTOMY TRNSV 106X77 (MISCELLANEOUS) ×3 IMPLANT
DRAPE SHEET LG 3/4 BI-LAMINATE (DRAPES) IMPLANT
ELECT CAUTERY BLADE 6.4 (BLADE) ×3 IMPLANT
ELECT REM PT RETURN 9FT ADLT (ELECTROSURGICAL) ×3
ELECTRODE REM PT RTRN 9FT ADLT (ELECTROSURGICAL) ×1 IMPLANT
GAUZE FLUFF 18X24 1PLY STRL (GAUZE/BANDAGES/DRESSINGS) ×3 IMPLANT
GLOVE BIO SURGEON STRL SZ 6.5 (GLOVE) ×2 IMPLANT
GLOVE BIO SURGEONS STRL SZ 6.5 (GLOVE) ×1
GLOVE EXAM NITRILE PF MED BLUE (GLOVE) ×3 IMPLANT
GLOVE PI ORTHOPRO 6.5 (GLOVE) ×2
GLOVE PI ORTHOPRO STRL 6.5 (GLOVE) ×1 IMPLANT
GOWN STRL REUS W/ TWL LRG LVL3 (GOWN DISPOSABLE) ×2 IMPLANT
GOWN STRL REUS W/TWL LRG LVL3 (GOWN DISPOSABLE) ×4
LIQUID BAND (GAUZE/BANDAGES/DRESSINGS) ×3 IMPLANT
MARGIN MAP 10MM (MISCELLANEOUS) IMPLANT
NDL SAFETY ECLIPSE 18X1.5 (NEEDLE) ×1 IMPLANT
NEEDLE HYPO 18GX1.5 SHARP (NEEDLE) ×2
NEEDLE HYPO 25X1 1.5 SAFETY (NEEDLE) ×6 IMPLANT
PACK BASIN MINOR ARMC (MISCELLANEOUS) ×3 IMPLANT
SLEVE PROBE SENORX GAMMA FIND (MISCELLANEOUS) ×3 IMPLANT
STOCKINETTE IMPERVIOUS 9X36 MD (GAUZE/BANDAGES/DRESSINGS) IMPLANT
SURGI-BRA LG (MISCELLANEOUS) ×3 IMPLANT
SUT ETHILON 3-0 FS-10 30 BLK (SUTURE)
SUT MNCRL 3-0 UNDYED SH (SUTURE) ×1 IMPLANT
SUT MNCRL 4-0 (SUTURE) ×2
SUT MNCRL 4-0 27XMFL (SUTURE) ×1
SUT MONOCRYL 3-0 UNDYED (SUTURE) ×2
SUT SILK 2 0 SH (SUTURE) ×3 IMPLANT
SUTURE EHLN 3-0 FS-10 30 BLK (SUTURE) IMPLANT
SUTURE MNCRL 4-0 27XMF (SUTURE) ×1 IMPLANT
SYRINGE 10CC LL (SYRINGE) ×6 IMPLANT

## 2015-09-27 NOTE — Anesthesia Postprocedure Evaluation (Signed)
Anesthesia Post Note  Patient: Sierra Green  Procedure(s) Performed: Procedure(s) (LRB): BREAST LUMPECTOMY WITH NEEDLE LOCALIZATION (Right) SENTINEL NODE BIOPSY (Right)  Patient location during evaluation: PACU Anesthesia Type: General Level of consciousness: awake and alert Pain management: pain level controlled Vital Signs Assessment: post-procedure vital signs reviewed and stable Respiratory status: spontaneous breathing and respiratory function stable Cardiovascular status: stable Anesthetic complications: no    Last Vitals:  Filed Vitals:   09/27/15 0933 09/27/15 1356  BP: 147/80 86/48  Pulse: 93 75  Temp: 36.7 C 36.3 C  Resp: 16 18    Last Pain:  Filed Vitals:   09/27/15 1401  PainSc: Asleep                 [name] K

## 2015-09-27 NOTE — Op Note (Signed)
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  Pre-operative Diagnosis: right breast DCIS  Post-operative Diagnosis: same  Surgeon: Hubbard Robinson   Assistants: none  Anesthesia: General endotracheal anesthesia  ASA Class: 2  Procedure Details  The patient was seen in the Holding Room. The risks, benefits, complications, treatment options, and expected outcomes were discussed with the patient. The possibilities of reaction to medication, pulmonary aspiration, bleeding, infection, the need for additional procedures, failure to diagnose a condition, and creating a complication requiring transfusion or operation were discussed with the patient. The patient concurred with the proposed plan, giving informed consent.  The site of surgery properly noted/marked. The patient was taken to the operating room, identified as Sierra Green and the procedure verified as Right Breast Lumpectomy and Sentinal Node Biopsy. A Time Out was held and the above information confirmed.  After induction of anesthesia, the right arm, breast, and chest were prepped and draped in standard fashion.  Using a hand-held gamma probe, axillary sentinel nodes were identified transcutaneously.  An oblique incision was created below the axillary hairline.  Dissection was carried through the clavipectoral fascia.  One axillary sentinel nodes were removed that was 54 on gamma probe and blue from dye and submitted to pathology.  The axillary incision was closed with a 4-0 Vicryl subcuticular closure in layers.    The lumpectomy was performed by creating an oblique incision over the upper outer quadrant of the breastaround the previously placed localization guidewire.  Dissection was carried down to the pectoral fascia.  Orientation sutures were placed .  Specimen radiography confirmed inclusion  of the mammographic lesion.  Hemostasis was achieved with cautery. The specimen was also sent to pathology and evaluated there, Dr. Reuel Derby called to confirm that the area of DCIS was in the specimen but had a close margin to the superior and superficial edge.  An additional superior lateral superficial edge of tissue was sent to pathology and was evaluated to show no further DCIS in this component. Additional margins from the medial lateral and inferior were also sent to pathology in formalin for evaluation. The wound was irrigated and closed with a 4-0 Vicryl subcuticular closure in layers.     Sterile dressings glue was applied. Gauze fluff dressing and surgical bra were applied to the patient as well.  At the end of the operation, all sponge, instrument, and needle counts were correct.  Findings: Right breast mass with needle localization, needle and clip in specimen per radiology, close margin on the superior superficial according to pathology additional margin taken there that was negative.  Estimated Blood Loss:  less than 50 mL         Drains: none         Total IV Fluids: 1052ml         Specimens: Right breast lumpectomy, superior/superficial margin, lateral, medial and inferior margins, and sentinel right lymph node         Implants: none         Complications:  None; patient tolerated the procedure well.         Disposition: PACU - hemodynamically stable.         Condition: stable

## 2015-09-27 NOTE — Progress Notes (Signed)
Dressing to right breast clean and dry on discharge

## 2015-09-27 NOTE — Anesthesia Preprocedure Evaluation (Addendum)
Anesthesia Evaluation  Patient identified by MRN, date of birth, ID band Patient awake    Reviewed: Allergy & Precautions, NPO status , Patient's Chart, lab work & pertinent test results  History of Anesthesia Complications Negative for: history of anesthetic complications  Airway Mallampati: II       Dental  (+) Partial Lower, Upper Dentures   Pulmonary neg pulmonary ROS, former smoker,           Cardiovascular hypertension, Pt. on medications + dysrhythmias Atrial Fibrillation      Neuro/Psych negative neurological ROS     GI/Hepatic negative GI ROS, Neg liver ROS,   Endo/Other  negative endocrine ROS  Renal/GU negative Renal ROS     Musculoskeletal   Abdominal   Peds  Hematology negative hematology ROS (+)   Anesthesia Other Findings   Reproductive/Obstetrics                            Anesthesia Physical Anesthesia Plan  ASA: II  Anesthesia Plan: General   Post-op Pain Management:    Induction: Intravenous  Airway Management Planned: LMA  Additional Equipment:   Intra-op Plan:   Post-operative Plan:   Informed Consent: I have reviewed the patients History and Physical, chart, labs and discussed the procedure including the risks, benefits and alternatives for the proposed anesthesia with the patient or authorized representative who has indicated his/her understanding and acceptance.     Plan Discussed with:   Anesthesia Plan Comments:         Anesthesia Quick Evaluation

## 2015-09-27 NOTE — Progress Notes (Signed)
Dr Ronelle Nigh aware of b/p 78/54 with orders for 500 ml fluid bolus started.

## 2015-09-27 NOTE — H&P (View-Only) (Signed)
Subjective:     Patient ID: Sierra Green, female   DOB: [birth year], 75 y.o.   MRN: [id number]  HPI  75 year old female who comes in today with a new finding of right breast DCIS and on routine mammogram screening. 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 Patient has not noticed any skin changes nipple dimpling breast masses or any breast pain. Patient does not have any family history of breast cancer or other kinds of cancers. Patient has not had any other biopsies other than the one in 2013. Patient did have 2 children started at the age of 75. Patient did breast-feed. Patient was on hormone replacement therapy for one year however stopped. She did not use any birth control due to history of a blood clot in her right eye.  Past Medical History  Diagnosis Date  . Allergy     Seasonal  . Back pain   . Chronic atrial fibrillation (HCC)     s/p 3 ablations, multiple cardioversions  . Hyperlipidemia   . Hypertension   . Osteoporosis   . Mitral valve regurgitation   . Sensorineural hearing loss (SNHL) of both ears     worst on right  . Vitamin D deficiency    Past Surgical History  Procedure Laterality Date  . Mastectomy Left 07/2011    DCIS- [place], [place]  . Augmentation mammaplasty Bilateral 10/2011  . Tonsillectomy  as child  . Tubal ligation Bilateral 1976    Laparoscopic  . Proximal interphalangeal fusion (pip) Right 05/15/2013    2nd, 3rd, and 5th digits  . Cataract extraction Bilateral Right- 05/2014; Left-06/2014   Family History  Problem Relation Age of Onset  . Breast cancer Neg Hx    Social History   Social History  . Marital Status: Unknown    Spouse Name: N/A  . Number of Children: N/A  . Years of Education: N/A   Social History Main Topics  . Smoking status: Former Smoker    Quit date: 07/02/1966  . Smokeless tobacco:  Never Used  . Alcohol Use: Yes     Comment: Glass of wine each night  . Drug Use: No  . Sexual Activity: Not Asked   Other Topics Concern  . None   Social History Narrative    Current outpatient prescriptions:  .  Cholecalciferol (VITAMIN D3) 5000 units TABS, Take 1 tablet by mouth daily. For 5 days per week, Disp: , Rfl:  .  diltiazem (TAZTIA XT) 120 MG 24 hr capsule, Take 1 tablet by mouth 2 (two) times daily., Disp: , Rfl:  .  fexofenadine (ALLEGRA) 180 MG tablet, Take 1 tablet by mouth daily as needed., Disp: , Rfl:  .  Red Yeast Rice Extract 600 MG CAPS, Take 1 tablet by mouth daily., Disp: , Rfl:  .  vitamin C (ASCORBIC ACID) 500 MG tablet, Take 1 tablet by mouth every other day., Disp: , Rfl:  .  warfarin (COUMADIN) 5 MG tablet, Take by mouth daily. 7.5mg  on Monday and Friday; 5mg  all other days, Disp: , Rfl:  Allergies  Allergen Reactions  . Oxycodone Anaphylaxis  . Zoledronic Acid Other (See Comments)    Iritis  . Other Other (See Comments)    Flu like symptoms  . Alendronate Other (See Comments)    Flu like symptoms  . Honey Bee Venom Swelling    Marked  local swelling       Review of Systems  Constitutional: Negative for fever, activity change, appetite change and unexpected weight change.  HENT: Negative for congestion and sore throat.   Respiratory: Negative for apnea and wheezing.   Cardiovascular: Negative for chest pain, palpitations and leg swelling.  Gastrointestinal: Negative for nausea, abdominal pain, diarrhea and abdominal distention.  Genitourinary: Negative for hematuria and difficulty urinating.  Musculoskeletal: Negative for back pain and joint swelling.  Skin: Negative for color change, pallor, rash and wound.  Neurological: Negative for dizziness and weakness.  Hematological: Negative for adenopathy. Does not bruise/bleed easily.  Psychiatric/Behavioral: Negative for agitation. The patient is not nervous/anxious.   All other systems reviewed  and are negative.      Filed Vitals:   09/07/15 1407  BP: 118/69  Pulse: 92  Temp: 97.9 F (36.6 C)    Objective:   Physical Exam  Constitutional: She is oriented to person, place, and time. She appears well-developed and well-nourished.  HENT:  Head: Normocephalic and atraumatic.  Right Ear: External ear normal.  Nose: Nose normal.  Mouth/Throat: Oropharynx is clear and moist. No oropharyngeal exudate.  Eyes: Conjunctivae and EOM are normal. Pupils are equal, round, and reactive to light. No scleral icterus.  Neck: Normal range of motion. Neck supple. No tracheal deviation present.  Cardiovascular: Normal rate and normal heart sounds.   No murmur heard. Irregular rhythm  Pulmonary/Chest: Effort normal and breath sounds normal. No respiratory distress. She has no wheezes. She has no rales.  Abdominal: Soft. Bowel sounds are normal. She exhibits no distension. There is no tenderness.  Musculoskeletal: Normal range of motion. She exhibits no edema or tenderness.  Neurological: She is alert and oriented to person, place, and time. No cranial nerve deficit.  Skin: Skin is warm and dry. No rash noted. No erythema. No pallor.  Psychiatric: She has a normal mood and affect. Her behavior is normal. Judgment and thought content normal.  Vitals reviewed. Breast Exam: Right breast with well-healed surgical scars, some bruising from biopsy site, no masses palpable no skin retraction no nipple discharge, no cervical or axillary lymphadenopathy.  Left breast with well-healed surgical scars some capsular contraction causing slight elevation of this rest compared to the other. No skin changes nipple dimpling or skin retraction. No masses palpable. No cervical or axillary lymphadenopathy.     Assessment:     75 year old female with right breast DCIS    Plan:     I discussed the available options with the patient and husband. I explained that DCIS is no diagnosis of cancer however could  potentially turn into that. I also discussed that in 30% of DCIS cases there could be a invasive cancer that just didn't show in the initial biopsy. The risk of recurrence is similar between mastectomy and lumpectomy with potentially radiation. I also discussed that given the small size of the DCIS would recommend a needle localization lumpectomy with likely radiation to follow. Explained to the patient that after her surgical treatment she may need to take an estrogen blocking medication but that would be determined by the tumor markers that were still being run.   I discussed risks of bleeding, infection, damage to surrounding tissues, having positive margins, needing further resection, damage to nerves causing arm numbness or difficulty raising arm, causing lymphoedema in the arm; as well as anesthesia risks of MI, stroke, prolonged ventilation, pulmonary embolism, thrombosis and even death.I also discussed with the patient and family that if  this did come back as positive for a cancer that we would need to do a second operation to get a sample of the lymph nodes. Patient was given the opportunity to ask questions and have them answered. They would like to proceed with Right breast needle localized lumpectomy.

## 2015-09-27 NOTE — Discharge Instructions (Signed)
AMBULATORY SURGERY  °DISCHARGE INSTRUCTIONS ° ° °1) The drugs that you were given will stay in your system until tomorrow so for the next 24 hours you should not: ° °A) Drive an automobile °B) Make any legal decisions °C) Drink any alcoholic beverage ° ° °2) You may resume regular meals tomorrow.  Today it is better to start with liquids and gradually work up to solid foods. ° °You may eat anything you prefer, but it is better to start with liquids, then soup and crackers, and gradually work up to solid foods. ° ° °3) Please notify your doctor immediately if you have any unusual bleeding, trouble breathing, redness and pain at the surgery site, drainage, fever, or pain not relieved by medication. ° ° ° °4) Additional Instructions: ° ° ° ° ° ° ° °Please contact your physician with any problems or Same Day Surgery at [phone], Monday through Friday 6 am to 4 pm, or [place] at [place] Main number at [phone].AMBULATORY SURGERY  °DISCHARGE INSTRUCTIONS ° ° °5) The drugs that you were given will stay in your system until tomorrow so for the next 24 hours you should not: ° °D) Drive an automobile °E) Make any legal decisions °F) Drink any alcoholic beverage ° ° °6) You may resume regular meals tomorrow.  Today it is better to start with liquids and gradually work up to solid foods. ° °You may eat anything you prefer, but it is better to start with liquids, then soup and crackers, and gradually work up to solid foods. ° ° °7) Please notify your doctor immediately if you have any unusual bleeding, trouble breathing, redness and pain at the surgery site, drainage, fever, or pain not relieved by medication. ° ° ° °8) Additional Instructions: ° ° ° ° ° ° ° °Please contact your physician with any problems or Same Day Surgery at [phone], Monday through Friday 6 am to 4 pm, or [place] at [place] Main number at [phone].AMBULATORY SURGERY  °DISCHARGE INSTRUCTIONS ° ° °9) The drugs that you were given  will stay in your system until tomorrow so for the next 24 hours you should not: ° °G) Drive an automobile °H) Make any legal decisions °I) Drink any alcoholic beverage ° ° °10) You may resume regular meals tomorrow.  Today it is better to start with liquids and gradually work up to solid foods. ° °You may eat anything you prefer, but it is better to start with liquids, then soup and crackers, and gradually work up to solid foods. ° ° °11) Please notify your doctor immediately if you have any unusual bleeding, trouble breathing, redness and pain at the surgery site, drainage, fever, or pain not relieved by medication. ° ° ° °12) Additional Instructions: ° ° ° ° ° ° ° °Please contact your physician with any problems or Same Day Surgery at [phone], Monday through Friday 6 am to 4 pm, or [place] at  Main number at [phone]. °

## 2015-09-27 NOTE — Anesthesia Procedure Notes (Signed)
Procedure Name: LMA Insertion Date/Time: 09/27/2015 10:12 AM Performed by: Johnna Acosta Pre-anesthesia Checklist: Patient identified, Emergency Drugs available, Suction available, Patient being monitored and Timeout performed Patient Re-evaluated:Patient Re-evaluated prior to inductionOxygen Delivery Method: Circle system utilized Preoxygenation: Pre-oxygenation with 100% oxygen Intubation Type: IV induction LMA: LMA inserted LMA Size: 3.5 Tube type: Oral Number of attempts: 1 Placement Confirmation: breath sounds checked- equal and bilateral and positive ETCO2 Tube secured with: Tape Dental Injury: Teeth and Oropharynx as per pre-operative assessment

## 2015-09-27 NOTE — Transfer of Care (Signed)
Immediate Anesthesia Transfer of Care Note  Patient: Sierra Green  Procedure(s) Performed: Procedure(s): BREAST LUMPECTOMY WITH NEEDLE LOCALIZATION (Right) SENTINEL NODE BIOPSY (Right)  Patient Location: PACU  Anesthesia Type:General  Level of Consciousness: sedated  Airway & Oxygen Therapy: Patient Spontanous Breathing and Patient connected to face mask oxygen  Post-op Assessment: Report given to RN and Post -op Vital signs reviewed and stable  Post vital signs: Reviewed and stable  Last Vitals:  Filed Vitals:   09/27/15 0933  BP: 147/80  Pulse: 93  Temp: 36.7 C  Resp: 16    Complications: No apparent anesthesia complications

## 2015-09-27 NOTE — Interval H&P Note (Signed)
History and Physical Interval Note:  09/27/2015 2:25 PM  Sierra Green  has presented today for surgery, with the diagnosis of Right breast DCIS  The various methods of treatment have been discussed with the patient and family. After consideration of risks, benefits and other options for treatment, the patient has consented to  Procedure(s): BREAST LUMPECTOMY WITH NEEDLE LOCALIZATION (Right) SENTINEL NODE BIOPSY (Right) as a surgical intervention .  The patient's history has been reviewed, patient examined, no change in status, stable for surgery.  I have reviewed the patient's chart and labs.  Questions were answered to the patient's satisfaction.     [name] L Caira Poche

## 2015-10-04 ENCOUNTER — Encounter: Payer: Self-pay | Admitting: *Deleted

## 2015-10-04 NOTE — Progress Notes (Signed)
  Oncology Nurse Navigator Documentation  Navigator Location: [facility] (10/04/15 1100) Navigator Encounter Type: Telephone (10/04/15 1100) Telephone: Outgoing Call (10/04/15 1100)             Barriers/Navigation Needs: No Questions;Education (10/04/15 1100) Education: Newly Diagnosed Cancer Education (10/04/15 1100) Interventions: Education Method (10/04/15 1100)     Education Method: Written (10/04/15 1100)      Acuity: Level 2 (10/04/15 1100)         Time Spent with Patient: 30 (10/04/15 1100)   Called patient today to follow-up after her surgery.  States she has had "rough time with anethesia".  Denied nausea and vomiting, but states she has been in a "fog".  She is going to discuss this with Dr. Azalee Course tomorrow.  Left patient breast cancer educational literature, "My Breast Cancer Treatment Handbook" by Josephine Igo, RN, with Amber at Dr. Geoffry Paradise office to give to patient tomorrow.  She is to call if she has any questions or needs.

## 2015-10-05 ENCOUNTER — Encounter: Payer: Self-pay | Admitting: Surgery

## 2015-10-05 ENCOUNTER — Ambulatory Visit (INDEPENDENT_AMBULATORY_CARE_PROVIDER_SITE_OTHER): Payer: Medicare PPO | Admitting: Surgery

## 2015-10-05 VITALS — BP 114/71 | HR 82 | Temp 98.3°F | Wt 140.0 lb

## 2015-10-05 DIAGNOSIS — C50911 Malignant neoplasm of unspecified site of right female breast: Secondary | ICD-10-CM

## 2015-10-05 DIAGNOSIS — D0511 Intraductal carcinoma in situ of right breast: Secondary | ICD-10-CM

## 2015-10-05 DIAGNOSIS — D0581 Other specified type of carcinoma in situ of right breast: Secondary | ICD-10-CM

## 2015-10-05 NOTE — Progress Notes (Deleted)
Subjective:     Patient ID: Sierra Green, female   DOB: Feb 07, 1941, 75 y.o.   MRN: [id number]  HPI  75 year old female with right breast DCIS. Patient had a right breast needle localization lumpectomy and sentinel lymph node biopsy performed on   Review of Systems     Objective:   Physical Exam     Assessment:     ***    Plan:     ***

## 2015-10-05 NOTE — Patient Instructions (Signed)
You will need to see Dr. Grayland Ormond and Dr. Donella Stade at the [facility]. Dr. Donella Stade is the Radiation Oncologist and your appointment with him is in the [facility] on 10/11/15 at [place].  Dr. Grayland Ormond is the Oncologist and your appointment with him is 10/07/15 at 0830 in the [facility] office.  We will follow-up in case conference with all involved in your care and we will let you know what needs to be done after this.

## 2015-10-05 NOTE — Progress Notes (Signed)
75 year old female with right breast DCIS. Patient had a right breast needle localization lumpectomy and sentinel lymph node biopsy performed on 3/28.  His states that she has been doing well since the surgery. Patient states that she's only had minimal pain and taken 1 dose of Tylenol for this. Patient states that her incision sites are clean and doing well without any drainage. Patient states that she did not do very well with anesthesia and felt like she was in a fog for 5 days afterwards. Patient states that otherwise she had some significant fatigue but is feeling better at this time. Patient also states that she had some depression and some labile mood right after surgery as well.  Filed Vitals:   10/05/15 1426  BP: 114/71  Pulse: 82  Temp: 98.3 F (36.8 C)   PE:  Gen: NAD Right breast: incisions c/d/i, minimal bruising, no erythema or drainage  A/P: Healing well after needle localization lumpectomy. I discussed with the patient her pathology results which include the DCIS with high-grade necrosis with good margins in that area as well as one lymph node that was negative for any malignancy. I did discuss with her that there appeared to be a second focus in a medial margin that was taken that had an intermediate grade DCIS and on the pathology report comments of a focal margin where there is a focus of DCIS however does not give a size of the area but does state that there is focal margin involvement. I am going to discuss this with our breast pathologist. I have also discussed this with Dr. Donella Stade our radiation oncologist and will have her follow-up with him to determine if radiation is needed due to the high-grade of the DCIS as well as if radiation can prevent [place] from needing to do a second operation to remove move the small focus. Will also get her onto the tumor board for next Monday to see if we can discuss her multidisciplinary rounds.

## 2015-10-06 LAB — SURGICAL PATHOLOGY

## 2015-10-07 ENCOUNTER — Inpatient Hospital Stay: Payer: Medicare PPO | Attending: Oncology | Admitting: Oncology

## 2015-10-07 VITALS — BP 100/67 | HR 116 | Temp 96.5°F | Resp 16 | Ht 64.57 in | Wt 139.6 lb

## 2015-10-07 DIAGNOSIS — I10 Essential (primary) hypertension: Secondary | ICD-10-CM | POA: Diagnosis not present

## 2015-10-07 DIAGNOSIS — Z79899 Other long term (current) drug therapy: Secondary | ICD-10-CM

## 2015-10-07 DIAGNOSIS — Z79810 Long term (current) use of selective estrogen receptor modulators (SERMs): Secondary | ICD-10-CM

## 2015-10-07 DIAGNOSIS — H905 Unspecified sensorineural hearing loss: Secondary | ICD-10-CM | POA: Insufficient documentation

## 2015-10-07 DIAGNOSIS — I482 Chronic atrial fibrillation: Secondary | ICD-10-CM

## 2015-10-07 DIAGNOSIS — Z170 Estrogen receptor positive status [ER+]: Secondary | ICD-10-CM | POA: Diagnosis not present

## 2015-10-07 DIAGNOSIS — Z86000 Personal history of in-situ neoplasm of breast: Secondary | ICD-10-CM | POA: Diagnosis not present

## 2015-10-07 DIAGNOSIS — E785 Hyperlipidemia, unspecified: Secondary | ICD-10-CM

## 2015-10-07 DIAGNOSIS — Z87891 Personal history of nicotine dependence: Secondary | ICD-10-CM

## 2015-10-07 DIAGNOSIS — D0511 Intraductal carcinoma in situ of right breast: Secondary | ICD-10-CM | POA: Diagnosis not present

## 2015-10-07 DIAGNOSIS — E559 Vitamin D deficiency, unspecified: Secondary | ICD-10-CM

## 2015-10-07 DIAGNOSIS — Z7901 Long term (current) use of anticoagulants: Secondary | ICD-10-CM

## 2015-10-07 DIAGNOSIS — Z9012 Acquired absence of left breast and nipple: Secondary | ICD-10-CM

## 2015-10-07 MED ORDER — TAMOXIFEN CITRATE 20 MG PO TABS: 20.0000 mg | ORAL_TABLET | Freq: Every day | ORAL | Status: DC

## 2015-10-07 NOTE — Progress Notes (Signed)
Patient has a new diagnosis of DCIS and had a lumpectomy last week.  She also had DCIS 4 years ago and had a mastectomy while living in [place].

## 2015-10-11 ENCOUNTER — Encounter: Payer: Self-pay | Admitting: Radiation Oncology

## 2015-10-11 ENCOUNTER — Ambulatory Visit
Admission: RE | Admit: 2015-10-11 | Discharge: 2015-10-11 | Disposition: A | Discharge status: Home / Self Care | Payer: Medicare PPO | Source: Ambulatory Visit | Attending: Radiation Oncology | Admitting: Radiation Oncology

## 2015-10-11 VITALS — BP 128/88 | HR 64 | Temp 98.0°F | Resp 21 | Wt 140.2 lb

## 2015-10-11 DIAGNOSIS — Z171 Estrogen receptor negative status [ER-]: Secondary | ICD-10-CM | POA: Insufficient documentation

## 2015-10-11 DIAGNOSIS — M818 Other osteoporosis without current pathological fracture: Secondary | ICD-10-CM | POA: Insufficient documentation

## 2015-10-11 DIAGNOSIS — E559 Vitamin D deficiency, unspecified: Secondary | ICD-10-CM | POA: Insufficient documentation

## 2015-10-11 DIAGNOSIS — Z87891 Personal history of nicotine dependence: Secondary | ICD-10-CM | POA: Insufficient documentation

## 2015-10-11 DIAGNOSIS — I10 Essential (primary) hypertension: Secondary | ICD-10-CM | POA: Insufficient documentation

## 2015-10-11 DIAGNOSIS — I482 Chronic atrial fibrillation: Secondary | ICD-10-CM | POA: Insufficient documentation

## 2015-10-11 DIAGNOSIS — D0511 Intraductal carcinoma in situ of right breast: Secondary | ICD-10-CM

## 2015-10-11 DIAGNOSIS — E785 Hyperlipidemia, unspecified: Secondary | ICD-10-CM | POA: Insufficient documentation

## 2015-10-11 DIAGNOSIS — M549 Dorsalgia, unspecified: Secondary | ICD-10-CM | POA: Insufficient documentation

## 2015-10-11 DIAGNOSIS — Z510 Encounter for antineoplastic radiation therapy: Secondary | ICD-10-CM | POA: Insufficient documentation

## 2015-10-11 DIAGNOSIS — Z79899 Other long term (current) drug therapy: Secondary | ICD-10-CM | POA: Insufficient documentation

## 2015-10-11 DIAGNOSIS — I340 Nonrheumatic mitral (valve) insufficiency: Secondary | ICD-10-CM | POA: Insufficient documentation

## 2015-10-11 NOTE — Consult Note (Signed)
Except an outstanding is perfect of Radiation Oncology NEW PATIENT EVALUATION  Name: Sierra Green  MRN: [id number]  Date:   10/11/2015     DOB: 10/22/1940   This 75 y.o. 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 Breast Cancer    Initial consult    DIAGNOSIS: The encounter diagnosis was DCIS (ductal carcinoma in situ) of breast, right.   PREVIOUS INVESTIGATIONS:  Mammograms and ultrasound reviewed Pathology report reviewed Clinical notes reviewed Case presented at breast cancer conference  HPI: Patient is a pleasant 75 year old female now 4 years out from a left modified radical mastectomy with bilateral breast reconstruction for multifocal ductal carcinoma in situ. She was found on routine screening mammogram to have a mass in the right breast. Targeted ultrasound confirmed a 1.8 x 1.7 mass at 1233 cm from the nipple highly suggestive of malignancy. Needle localization confirmed ductal carcinoma in situ. Patient has bilateral breast implants. She underwent a wide local excision showing high-grade ductal carcinoma in situ with comedonecrosis. There seen to be a separate focal mass of DCIS which was also removed with focal margin involvement. ER/PR status was negative. We have presented her case at our weekly tumor rest conference and recommendation for either further surgery for positive margin or radiation therapy with scar boost was proposed. Based on the ER/PR negative nature of her disease tamoxifen would be of questionable value. Patient has some social issues including her caring for her sister-in-law and [place] and financial concerns which were all discussed at great length today. Patient also had a sentinel lymph node  which was negative.  PLANNED TREATMENT REGIMEN: Hypofractionated course of whole breast radiation with scar boost  PAST MEDICAL HISTORY:  has a past medical history of Allergy; Back pain; Chronic atrial fibrillation ([place]); Hyperlipidemia; Hypertension; Osteoporosis; Mitral valve regurgitation; Sensorineural hearing loss (SNHL) of both ears; Vitamin D deficiency; Dysrhythmia (1998); and Cancer ([place]) (2013).    PAST SURGICAL HISTORY:  Past Surgical History  Procedure Laterality Date  . Mastectomy Left 07/2011    DCIS- [place], [place]  . Augmentation mammaplasty Bilateral 10/2011  . Tonsillectomy  as child  . Tubal ligation Bilateral 1976    Laparoscopic  . Proximal interphalangeal fusion (pip) Right 05/15/2013    2nd, 3rd, and 5th digits  . Cataract extraction Bilateral Right- 05/2014; Left-06/2014  . Heart ablation      1 at [facility] and 2 at [facility]  . Breast lumpectomy with needle localization Right 09/27/2015    Procedure: BREAST LUMPECTOMY WITH NEEDLE LOCALIZATION;  Surgeon: Hubbard Robinson, MD;  Location: [facility] ORS;  Service: General;  Laterality: Right;  . Sentinel node biopsy Right 09/27/2015    Procedure: SENTINEL NODE BIOPSY;  Surgeon: Hubbard Robinson, MD;  Location: [facility] ORS;  Service: General;  Laterality: Right;    FAMILY HISTORY: family history is negative for Breast cancer.  SOCIAL HISTORY:  reports that she quit smoking about 49 years ago. She has never used smokeless tobacco. She reports that she drinks about 2.4 - 3.0 oz of alcohol per week. She reports that she does not use illicit drugs.  ALLERGIES: Oxycodone; Zoledronic acid; Other; Alendronate; and Honey bee venom  MEDICATIONS:  Current Outpatient Prescriptions  Medication Sig Dispense Refill  .  Cholecalciferol (VITAMIN D3) 5000 units TABS Take 1 tablet by mouth daily. For 5 days per week    . diltiazem (TAZTIA XT) 120 MG 24 hr capsule Take 1 tablet by mouth 2 (two) times daily.    .  fexofenadine (ALLEGRA) 180 MG tablet Take 1 tablet by mouth daily as needed.    . Red Yeast Rice Extract 600 MG CAPS Take 1 tablet by mouth daily.    . tamoxifen (NOLVADEX) 20 MG tablet Take 1 tablet (20 mg total) by mouth daily. 90 tablet 3  . vitamin C (ASCORBIC ACID) 500 MG tablet Take 1 tablet by mouth every other day.    . warfarin (COUMADIN) 5 MG tablet Take by mouth daily. 7.5mg  on Monday and Friday; 5mg  all other days     No current facility-administered medications for this encounter.    ECOG PERFORMANCE STATUS:  0 - Asymptomatic  REVIEW OF SYSTEMS:  Patient denies any weight loss, fatigue, weakness, fever, chills or night sweats. Patient denies any loss of vision, blurred vision. Patient denies any ringing  of the ears or hearing loss. No irregular heartbeat. Patient denies heart murmur or history of fainting. Patient denies any chest pain or pain radiating to her upper extremities. Patient denies any shortness of breath, difficulty breathing at night, cough or hemoptysis. Patient denies any swelling in the lower legs. Patient denies any nausea vomiting, vomiting of blood, or coffee ground material in the vomitus. Patient denies any stomach pain. Patient states has had normal bowel movements no significant constipation or diarrhea. Patient denies any dysuria, hematuria or significant nocturia. Patient denies any problems walking, swelling in the joints or loss of balance. Patient denies any skin changes, loss of hair or loss of weight. Patient denies any excessive worrying or anxiety or significant depression. Patient denies any problems with insomnia. Patient denies excessive thirst, polyuria, polydipsia. Patient denies any swollen glands, patient denies easy bruising or easy bleeding. Patient denies any recent infections, allergies or URI. Patient "s visual fields have not changed significantly in recent time.    PHYSICAL EXAM: BP 128/88 mmHg  Pulse 64  Temp(Src) 98 F (36.7 C)  Resp  21  Wt 140 lb 3.4 oz (63.6 kg) Patient is status post bilateral breast reconstruction with implants present. She's wide local excision scar of the right breast which is healing well. No dominant mass or nodularity is noted in either breast in 2 positions examined. No axillary or supraclavicular adenopathy is appreciated. Well-developed well-nourished patient in NAD. HEENT reveals PERLA, EOMI, discs not visualized.  Oral cavity is clear. No oral mucosal lesions are identified. Neck is clear without evidence of cervical or supraclavicular adenopathy. Lungs are clear to A&P. Cardiac examination is essentially unremarkable with regular rate and rhythm without murmur rub or thrill. Abdomen is benign with no organomegaly or masses noted. Motor sensory and DTR levels are equal and symmetric in the upper and lower extremities. Cranial nerves II through XII are grossly intact. Proprioception is intact. No peripheral adenopathy or edema is identified. No motor or sensory levels are noted. Crude visual fields are within normal range.  LABORATORY DATA: Pathology reports reviewed    RADIOLOGY RESULTS: Mammograms and ultrasound reviewed specimen mammogram reviewed   IMPRESSION: High-grade ductal carcinoma in situ with positive margin in woman with bilateral breast implants ER/PR negative in 75 year old female  PLAN: At this time I had a long discussion with the patient about our recommendations and recommendations of the breast cancer conference. I am concerned  her tumor is ER/PR negative with positive margin which makes her high risk for recurrent disease in that breast. Would recommend hypofractionated radiation therapy over 4 weeks plus a boost of 1600 cGy using electron beam to her scar bed. Risks and benefits of treatment including contraction around her implants, fatigue, alteration of blood counts skin reaction inclusion of superficial lung all were discussed in detail with the patient. She seems to comprehend  my treatment plan well. I first set up and ordered CT simulation for early next week. Her husband will join [place] next week for again discussion with all questions answered prior to signing consent.  I would like to take this opportunity for allowing me to participate in the care of your patient.Armstead Peaks., MD

## 2015-10-11 NOTE — Progress Notes (Signed)
Both written and verbal information given to the patient.

## 2015-10-16 NOTE — Progress Notes (Signed)
[place]  Telephone:(336) [phone] Fax:([phone]) [phone]  ID: Sierra Green OB: 1941/03/04  MR#: [id number]  AOZ#:[id number]  Patient Care Team: Ezequiel Kayser, MD as PCP - General (Internal Medicine)  CHIEF COMPLAINT:  Chief Complaint  Patient presents with  . New Evaluation    reast cancer    INTERVAL HISTORY: Patient is a 75 year old female who recently underwent a right breast lumpectomy was found to have DCIS without invasive component. Patient reports she had a full mastectomy 4 years ago while living in [place] and was also diagnosed with DCIS at that time, but she did not take adjuvant hormonal treatment. She currently feels well and is asymptomatic. She has no neurologic complaints. She denies any recent fevers or illnesses. She has good appetite and denies weight loss. She denies any pain. She has no chest pain or shortness of breath. She denies any nausea, vomiting, constipation, or diarrhea. She has no urinary complaints. Patient feels at her baseline and offers no specific complaints today.   REVIEW OF SYSTEMS:   Review of Systems  Constitutional: Negative.  Negative for fever, weight loss and malaise/fatigue.  Respiratory: Negative.  Negative for shortness of breath.   Cardiovascular: Negative.  Negative for chest pain.  Gastrointestinal: Negative.   Genitourinary: Negative.   Musculoskeletal: Negative.   Neurological: Negative.  Negative for weakness.  Psychiatric/Behavioral: Negative.     As per HPI. Otherwise, a complete review of systems is negatve.  PAST MEDICAL HISTORY: Past Medical History  Diagnosis Date  . Allergy     Seasonal  . Back pain   . Chronic atrial fibrillation ([facility])     s/p 3 ablations, multiple cardioversions  . Hyperlipidemia   . Hypertension   . Osteoporosis   . Mitral valve regurgitation   . Sensorineural hearing loss (SNHL) of both ears     worst on right  . Vitamin D deficiency   . Dysrhythmia 1998    A-fib   . Cancer [facility]) 2013    left breast    PAST SURGICAL HISTORY: Past Surgical History  Procedure Laterality Date  . Mastectomy Left 07/2011    DCIS- [place], [place]  . Augmentation mammaplasty Bilateral 10/2011  . Tonsillectomy  as child  . Tubal ligation Bilateral 1976    Laparoscopic  . Proximal interphalangeal fusion (pip) Right 05/15/2013    2nd, 3rd, and 5th digits  . Cataract extraction Bilateral Right- 05/2014; Left-06/2014  . Heart ablation      1 at [facility] and 2 at [facility]  . Breast lumpectomy with needle localization Right 09/27/2015    Procedure: BREAST LUMPECTOMY WITH NEEDLE LOCALIZATION;  Surgeon: Hubbard Robinson, MD;  Location: [facility] ORS;  Service: General;  Laterality: Right;  . Sentinel node biopsy Right 09/27/2015    Procedure: SENTINEL NODE BIOPSY;  Surgeon: Hubbard Robinson, MD;  Location: [facility] ORS;  Service: General;  Laterality: Right;    FAMILY HISTORY Family History  Problem Relation Age of Onset  . Breast cancer Neg Hx        ADVANCED DIRECTIVES:    HEALTH MAINTENANCE: Social History  Substance Use Topics  . Smoking status: Former Smoker    Quit date: 07/02/1966  . Smokeless tobacco: Never Used  . Alcohol Use: 2.4 - 3.0 oz/week    4-5 Glasses of wine per week     Colonoscopy:  PAP:  Bone density:  Lipid panel:  Allergies  Allergen Reactions  . Oxycodone Anaphylaxis  . Zoledronic Acid Other (  See Comments)    Iritis  . Other Other (See Comments)    boniva Flu like symptoms  . Alendronate Other (See Comments)    Flu like symptoms  . Honey Bee Venom Swelling    Marked local swelling    Current Outpatient Prescriptions  Medication Sig Dispense Refill  . Cholecalciferol (VITAMIN D3) 5000 units TABS Take 1 tablet by mouth daily. For 5 days per week    . diltiazem (TAZTIA XT) 120 MG 24 hr capsule Take 1 tablet by mouth 2 (two) times daily.    . fexofenadine (ALLEGRA) 180 MG tablet Take 1 tablet by mouth daily as  needed.    . Red Yeast Rice Extract 600 MG CAPS Take 1 tablet by mouth daily.    . vitamin C (ASCORBIC ACID) 500 MG tablet Take 1 tablet by mouth every other day.    . warfarin (COUMADIN) 5 MG tablet Take by mouth daily. 7.63m on Monday and Friday; 526mall other days    . tamoxifen (NOLVADEX) 20 MG tablet Take 1 tablet (20 mg total) by mouth daily. 90 tablet 3   No current facility-administered medications for this visit.    OBJECTIVE: Filed Vitals:   10/07/15 0843  BP: 100/67  Pulse: 116  Temp: 96.5 F (35.8 C)  Resp: 16     Body mass index is 23.54 kg/(m^2).    ECOG FS:0 - Asymptomatic  General: Well-developed, well-nourished, no acute distress. Eyes: Pink conjunctiva, anicteric sclera. HEENT: Normocephalic, moist mucous membranes, clear oropharnyx. Lungs: Clear to auscultation bilaterally. Heart: Regular rate and rhythm. No rubs, murmurs, or gallops. Abdomen: Soft, nontender, nondistended. No organomegaly noted, normoactive bowel sounds. Musculoskeletal: No edema, cyanosis, or clubbing. Neuro: Alert, answering all questions appropriately. Cranial nerves grossly intact. Skin: No rashes or petechiae noted. Psych: Normal affect. Lymphatics: No cervical, calvicular, axillary or inguinal LAD.   LAB RESULTS:  Lab Results  Component Value Date   NA 136 09/19/2015   K 4.4 09/19/2015   CL 104 09/19/2015   CO2 26 09/19/2015   GLUCOSE 98 09/19/2015   BUN 18 09/19/2015   CREATININE 0.63 09/19/2015   CALCIUM 9.3 09/19/2015   PROT 7.3 09/19/2015   ALBUMIN 4.4 09/19/2015   AST 25 09/19/2015   ALT 16 09/19/2015   ALKPHOS 67 09/19/2015   BILITOT 0.9 09/19/2015   GFRNONAA >60 09/19/2015   GFRAA >60 09/19/2015    Lab Results  Component Value Date   WBC 7.5 09/19/2015   NEUTROABS 4.9 09/19/2015   HGB 13.4 09/19/2015   HCT 39.7 09/19/2015   MCV 91.8 09/19/2015   PLT 213 09/19/2015     STUDIES: Nm Sentinel Node Inj-no Rpt (breast)  09/27/2015  CLINICAL DATA: Ductal  Carcinoma of Rt Breast Sulfur colloid was injected intradermally by the nuclear medicine technologist for breast cancer sentinel node localization.   Mm Breast Surgical Specimen  09/27/2015  CLINICAL DATA:  Patient with a right breast DCIS status post lumpectomy today after needle localization. EXAM: SPECIMEN RADIOGRAPH OF THE RIGHT BREAST COMPARISON:  Previous exam(s). FINDINGS: Status post excision of the right breast. The wire tip and biopsy marker clip are present within the specimen. Findings discussed with the surgeon during the procedure. IMPRESSION: Specimen radiograph of the right breast. Electronically Signed   By: StFranki Cabot.D.   On: 09/27/2015 12:55   Mm Digital Diagnostic Unilat R  09/27/2015  CLINICAL DATA:  Patient with right breast DCIS for lumpectomy today after needle localization. EXAM: NEEDLE LOCALIZATION OF  THE RIGHT BREAST WITH ULTRASOUND GUIDANCE COMPARISON:  Previous exams. FINDINGS: Patient presents for needle localization prior to breast conservation surgery. I met with the patient and we discussed the procedure of needle localization including benefits and alternatives. We discussed the high likelihood of a successful procedure. We discussed the risks of the procedure, including infection, bleeding, tissue injury, and further surgery. Informed, written consent was given. The usual time-out protocol was performed immediately prior to the procedure. Using ultrasound guidance, sterile technique, 1% lidocaine and a 7 cm modified Kopans needle, the right breast mass at the 12:30 o'clock axis was localized using a lateral approach. The images were marked for Dr. Azalee Course. After ultrasound-guided placement of the localization wire, 2D CC and ML views of the right breast were obtained to confirm appropriate positioning of the localization wire relative to the biopsy clip. The wire is well positioned relative to the biopsy clip. IMPRESSION: Needle localization of the right breast mass at  the 12:30 o'clock axis. No apparent complications. Electronically Signed   By: Franki Cabot M.D.   On: 09/27/2015 12:58   [place] Rt Plc Breast Loc Dev   1st Lesion  Inc US Guide  09/27/2015  CLINICAL DATA:  Patient with right breast DCIS for lumpectomy today after needle localization. EXAM: NEEDLE LOCALIZATION OF THE RIGHT BREAST WITH ULTRASOUND GUIDANCE COMPARISON:  Previous exams. FINDINGS: Patient presents for needle localization prior to breast conservation surgery. I met with the patient and we discussed the procedure of needle localization including benefits and alternatives. We discussed the high likelihood of a successful procedure. We discussed the risks of the procedure, including infection, bleeding, tissue injury, and further surgery. Informed, written consent was given. The usual time-out protocol was performed immediately prior to the procedure. Using ultrasound guidance, sterile technique, 1% lidocaine and a 7 cm modified Kopans needle, the right breast mass at the 12:30 o'clock axis was localized using a lateral approach. The images were marked for Dr. Azalee Course. After ultrasound-guided placement of the localization wire, 2D CC and ML views of the right breast were obtained to confirm appropriate positioning of the localization wire relative to the biopsy clip. The wire is well positioned relative to the biopsy clip. IMPRESSION: Needle localization of the right breast mass at the 12:30 o'clock axis. No apparent complications. Electronically Signed   By: Franki Cabot M.D.   On: 09/27/2015 12:58    ASSESSMENT: Right breast DCIS.  PLAN:    1. DCIS: Patient was diagnosed with DCIS in her other breast 4 years ago, but did not take adjuvant hormonal therapy. After lengthy discussion with the patient, she has agreed to proceed with tamoxifen for total 5 years completing in March 2022. Although patient only had lumpectomy as well as focally positive margins, she was hesitant proceed with XRT. After  consultation with radiation oncology on October 11, 2015, she agreed to proceed with adjuvant XRT.  Return to clinic in 3 months with repeat laboratory work and further evaluation.  Patient expressed understanding and was in agreement with this plan. She also understands that She can call clinic at any time with any questions, concerns, or complaints.   Approximately 45 minutes was spent in discussion of which greater than 50% was consultation.  Ductal carcinoma in situ (DCIS) of right breast   Staging form: Breast, AJCC 7th Edition     Clinical stage from 10/16/2015: Stage 0 (Tis (DCIS), N0, M0) - Signed by Lloyd Huger, MD on 10/16/2015   Lloyd Huger, MD  10/16/2015 11:16 PM

## 2015-10-17 ENCOUNTER — Ambulatory Visit
Admission: RE | Admit: 2015-10-17 | Discharge: 2015-10-17 | Disposition: A | Discharge status: Home / Self Care | Payer: Medicare PPO | Source: Ambulatory Visit | Attending: Radiation Oncology | Admitting: Radiation Oncology

## 2015-10-17 DIAGNOSIS — Z87891 Personal history of nicotine dependence: Secondary | ICD-10-CM | POA: Diagnosis not present

## 2015-10-17 DIAGNOSIS — E785 Hyperlipidemia, unspecified: Secondary | ICD-10-CM | POA: Diagnosis not present

## 2015-10-17 DIAGNOSIS — M549 Dorsalgia, unspecified: Secondary | ICD-10-CM | POA: Diagnosis not present

## 2015-10-17 DIAGNOSIS — D0511 Intraductal carcinoma in situ of right breast: Secondary | ICD-10-CM | POA: Diagnosis not present

## 2015-10-17 DIAGNOSIS — E559 Vitamin D deficiency, unspecified: Secondary | ICD-10-CM | POA: Diagnosis not present

## 2015-10-17 DIAGNOSIS — I10 Essential (primary) hypertension: Secondary | ICD-10-CM | POA: Diagnosis not present

## 2015-10-17 DIAGNOSIS — I482 Chronic atrial fibrillation: Secondary | ICD-10-CM | POA: Diagnosis not present

## 2015-10-17 DIAGNOSIS — M818 Other osteoporosis without current pathological fracture: Secondary | ICD-10-CM | POA: Diagnosis not present

## 2015-10-17 DIAGNOSIS — Z79899 Other long term (current) drug therapy: Secondary | ICD-10-CM | POA: Diagnosis not present

## 2015-10-17 DIAGNOSIS — I340 Nonrheumatic mitral (valve) insufficiency: Secondary | ICD-10-CM | POA: Diagnosis not present

## 2015-10-17 DIAGNOSIS — Z171 Estrogen receptor negative status [ER-]: Secondary | ICD-10-CM | POA: Diagnosis not present

## 2015-10-17 DIAGNOSIS — Z510 Encounter for antineoplastic radiation therapy: Secondary | ICD-10-CM | POA: Diagnosis present

## 2015-10-18 DIAGNOSIS — Z510 Encounter for antineoplastic radiation therapy: Secondary | ICD-10-CM | POA: Diagnosis not present

## 2015-10-21 ENCOUNTER — Other Ambulatory Visit: Payer: Self-pay | Admitting: *Deleted

## 2015-10-21 DIAGNOSIS — D0511 Intraductal carcinoma in situ of right breast: Secondary | ICD-10-CM

## 2015-10-24 ENCOUNTER — Ambulatory Visit
Admission: RE | Admit: 2015-10-24 | Discharge: 2015-10-24 | Disposition: A | Discharge status: Home / Self Care | Payer: Medicare PPO | Source: Ambulatory Visit | Attending: Radiation Oncology | Admitting: Radiation Oncology

## 2015-10-24 DIAGNOSIS — Z510 Encounter for antineoplastic radiation therapy: Secondary | ICD-10-CM | POA: Diagnosis not present

## 2015-10-25 ENCOUNTER — Ambulatory Visit
Admission: RE | Admit: 2015-10-25 | Discharge: 2015-10-25 | Disposition: A | Discharge status: Home / Self Care | Payer: Medicare PPO | Source: Ambulatory Visit | Attending: Radiation Oncology | Admitting: Radiation Oncology

## 2015-10-25 DIAGNOSIS — Z510 Encounter for antineoplastic radiation therapy: Secondary | ICD-10-CM | POA: Diagnosis not present

## 2015-10-26 ENCOUNTER — Ambulatory Visit
Admission: RE | Admit: 2015-10-26 | Discharge: 2015-10-26 | Disposition: A | Discharge status: Home / Self Care | Payer: Medicare PPO | Source: Ambulatory Visit | Attending: Radiation Oncology | Admitting: Radiation Oncology

## 2015-10-26 DIAGNOSIS — Z510 Encounter for antineoplastic radiation therapy: Secondary | ICD-10-CM | POA: Diagnosis not present

## 2015-10-27 ENCOUNTER — Ambulatory Visit
Admission: RE | Admit: 2015-10-27 | Discharge: 2015-10-27 | Disposition: A | Discharge status: Home / Self Care | Payer: Medicare PPO | Source: Ambulatory Visit | Attending: Radiation Oncology | Admitting: Radiation Oncology

## 2015-10-27 DIAGNOSIS — Z510 Encounter for antineoplastic radiation therapy: Secondary | ICD-10-CM | POA: Diagnosis not present

## 2015-10-28 ENCOUNTER — Ambulatory Visit
Admission: RE | Admit: 2015-10-28 | Discharge: 2015-10-28 | Disposition: A | Discharge status: Home / Self Care | Payer: Medicare PPO | Source: Ambulatory Visit | Attending: Radiation Oncology | Admitting: Radiation Oncology

## 2015-10-28 DIAGNOSIS — Z510 Encounter for antineoplastic radiation therapy: Secondary | ICD-10-CM | POA: Diagnosis not present

## 2015-10-31 ENCOUNTER — Ambulatory Visit
Admission: RE | Admit: 2015-10-31 | Discharge: 2015-10-31 | Disposition: A | Discharge status: Home / Self Care | Payer: Medicare PPO | Source: Ambulatory Visit | Attending: Radiation Oncology | Admitting: Radiation Oncology

## 2015-10-31 DIAGNOSIS — Z510 Encounter for antineoplastic radiation therapy: Secondary | ICD-10-CM | POA: Diagnosis not present

## 2015-11-01 ENCOUNTER — Ambulatory Visit
Admission: RE | Admit: 2015-11-01 | Discharge: 2015-11-01 | Disposition: A | Discharge status: Home / Self Care | Payer: Medicare PPO | Source: Ambulatory Visit | Attending: Radiation Oncology | Admitting: Radiation Oncology

## 2015-11-01 DIAGNOSIS — Z510 Encounter for antineoplastic radiation therapy: Secondary | ICD-10-CM | POA: Diagnosis not present

## 2015-11-02 ENCOUNTER — Ambulatory Visit
Admission: RE | Admit: 2015-11-02 | Discharge: 2015-11-02 | Disposition: A | Discharge status: Home / Self Care | Payer: Medicare PPO | Source: Ambulatory Visit | Attending: Radiation Oncology | Admitting: Radiation Oncology

## 2015-11-02 DIAGNOSIS — Z510 Encounter for antineoplastic radiation therapy: Secondary | ICD-10-CM | POA: Diagnosis not present

## 2015-11-03 ENCOUNTER — Ambulatory Visit
Admission: RE | Admit: 2015-11-03 | Discharge: 2015-11-03 | Disposition: A | Discharge status: Home / Self Care | Payer: Medicare PPO | Source: Ambulatory Visit | Attending: Radiation Oncology | Admitting: Radiation Oncology

## 2015-11-03 DIAGNOSIS — Z510 Encounter for antineoplastic radiation therapy: Secondary | ICD-10-CM | POA: Diagnosis not present

## 2015-11-04 ENCOUNTER — Ambulatory Visit
Admission: RE | Admit: 2015-11-04 | Discharge: 2015-11-04 | Disposition: A | Discharge status: Home / Self Care | Payer: Medicare PPO | Source: Ambulatory Visit | Attending: Radiation Oncology | Admitting: Radiation Oncology

## 2015-11-04 DIAGNOSIS — Z510 Encounter for antineoplastic radiation therapy: Secondary | ICD-10-CM | POA: Diagnosis not present

## 2015-11-07 ENCOUNTER — Inpatient Hospital Stay: Payer: Medicare PPO | Attending: Oncology

## 2015-11-07 ENCOUNTER — Ambulatory Visit
Admission: RE | Admit: 2015-11-07 | Discharge: 2015-11-07 | Disposition: A | Discharge status: Home / Self Care | Payer: Medicare PPO | Source: Ambulatory Visit | Attending: Radiation Oncology | Admitting: Radiation Oncology

## 2015-11-07 DIAGNOSIS — Z79810 Long term (current) use of selective estrogen receptor modulators (SERMs): Secondary | ICD-10-CM | POA: Diagnosis not present

## 2015-11-07 DIAGNOSIS — Z86000 Personal history of in-situ neoplasm of breast: Secondary | ICD-10-CM | POA: Diagnosis not present

## 2015-11-07 DIAGNOSIS — D0511 Intraductal carcinoma in situ of right breast: Secondary | ICD-10-CM | POA: Diagnosis present

## 2015-11-07 DIAGNOSIS — Z170 Estrogen receptor positive status [ER+]: Secondary | ICD-10-CM | POA: Insufficient documentation

## 2015-11-07 DIAGNOSIS — Z9012 Acquired absence of left breast and nipple: Secondary | ICD-10-CM | POA: Insufficient documentation

## 2015-11-07 DIAGNOSIS — Z510 Encounter for antineoplastic radiation therapy: Secondary | ICD-10-CM | POA: Diagnosis not present

## 2015-11-07 LAB — CBC
HCT: 41.1 % (ref 35.0–47.0)
Hemoglobin: 14 g/dL (ref 12.0–16.0)
MCH: 31.2 pg (ref 26.0–34.0)
MCHC: 34.1 g/dL (ref 32.0–36.0)
MCV: 91.7 fL (ref 80.0–100.0)
PLATELETS: 191 10*3/uL (ref 150–440)
RBC: 4.48 MIL/uL (ref 3.80–5.20)
RDW: 13.8 % (ref 11.5–14.5)
WBC: 4.4 10*3/uL (ref 3.6–11.0)

## 2015-11-08 ENCOUNTER — Ambulatory Visit
Admission: RE | Admit: 2015-11-08 | Discharge: 2015-11-08 | Disposition: A | Discharge status: Home / Self Care | Payer: Medicare PPO | Source: Ambulatory Visit | Attending: Radiation Oncology | Admitting: Radiation Oncology

## 2015-11-08 DIAGNOSIS — Z510 Encounter for antineoplastic radiation therapy: Secondary | ICD-10-CM | POA: Diagnosis not present

## 2015-11-09 ENCOUNTER — Ambulatory Visit
Admission: RE | Admit: 2015-11-09 | Discharge: 2015-11-09 | Disposition: A | Discharge status: Home / Self Care | Payer: Medicare PPO | Source: Ambulatory Visit | Attending: Radiation Oncology | Admitting: Radiation Oncology

## 2015-11-09 DIAGNOSIS — Z510 Encounter for antineoplastic radiation therapy: Secondary | ICD-10-CM | POA: Diagnosis not present

## 2015-11-10 ENCOUNTER — Ambulatory Visit
Admission: RE | Admit: 2015-11-10 | Discharge: 2015-11-10 | Disposition: A | Discharge status: Home / Self Care | Payer: Medicare PPO | Source: Ambulatory Visit | Attending: Radiation Oncology | Admitting: Radiation Oncology

## 2015-11-10 DIAGNOSIS — Z510 Encounter for antineoplastic radiation therapy: Secondary | ICD-10-CM | POA: Diagnosis not present

## 2015-11-11 ENCOUNTER — Ambulatory Visit
Admission: RE | Admit: 2015-11-11 | Discharge: 2015-11-11 | Disposition: A | Discharge status: Home / Self Care | Payer: Medicare PPO | Source: Ambulatory Visit | Attending: Radiation Oncology | Admitting: Radiation Oncology

## 2015-11-11 DIAGNOSIS — Z510 Encounter for antineoplastic radiation therapy: Secondary | ICD-10-CM | POA: Diagnosis not present

## 2015-11-14 ENCOUNTER — Ambulatory Visit
Admission: RE | Admit: 2015-11-14 | Discharge: 2015-11-14 | Disposition: A | Discharge status: Home / Self Care | Payer: Medicare PPO | Source: Ambulatory Visit | Attending: Radiation Oncology | Admitting: Radiation Oncology

## 2015-11-14 DIAGNOSIS — Z510 Encounter for antineoplastic radiation therapy: Secondary | ICD-10-CM | POA: Diagnosis not present

## 2015-11-15 ENCOUNTER — Ambulatory Visit
Admission: RE | Admit: 2015-11-15 | Discharge: 2015-11-15 | Disposition: A | Discharge status: Home / Self Care | Payer: Medicare PPO | Source: Ambulatory Visit | Attending: Radiation Oncology | Admitting: Radiation Oncology

## 2015-11-15 DIAGNOSIS — Z510 Encounter for antineoplastic radiation therapy: Secondary | ICD-10-CM | POA: Diagnosis not present

## 2015-11-16 ENCOUNTER — Ambulatory Visit
Admission: RE | Admit: 2015-11-16 | Discharge: 2015-11-16 | Disposition: A | Discharge status: Home / Self Care | Payer: Medicare PPO | Source: Ambulatory Visit | Attending: Radiation Oncology | Admitting: Radiation Oncology

## 2015-11-16 DIAGNOSIS — Z510 Encounter for antineoplastic radiation therapy: Secondary | ICD-10-CM | POA: Diagnosis not present

## 2015-11-17 ENCOUNTER — Ambulatory Visit
Admission: RE | Admit: 2015-11-17 | Discharge: 2015-11-17 | Disposition: A | Discharge status: Home / Self Care | Payer: Medicare PPO | Source: Ambulatory Visit | Attending: Radiation Oncology | Admitting: Radiation Oncology

## 2015-11-17 DIAGNOSIS — Z510 Encounter for antineoplastic radiation therapy: Secondary | ICD-10-CM | POA: Diagnosis not present

## 2015-11-18 ENCOUNTER — Ambulatory Visit
Admission: RE | Admit: 2015-11-18 | Discharge: 2015-11-18 | Disposition: A | Discharge status: Home / Self Care | Payer: Medicare PPO | Source: Ambulatory Visit | Attending: Radiation Oncology | Admitting: Radiation Oncology

## 2015-11-18 DIAGNOSIS — Z510 Encounter for antineoplastic radiation therapy: Secondary | ICD-10-CM | POA: Diagnosis not present

## 2015-11-21 ENCOUNTER — Inpatient Hospital Stay: Payer: Medicare PPO

## 2015-11-21 ENCOUNTER — Ambulatory Visit
Admission: RE | Admit: 2015-11-21 | Discharge: 2015-11-21 | Disposition: A | Discharge status: Home / Self Care | Payer: Medicare PPO | Source: Ambulatory Visit | Attending: Radiation Oncology | Admitting: Radiation Oncology

## 2015-11-21 DIAGNOSIS — D0511 Intraductal carcinoma in situ of right breast: Secondary | ICD-10-CM

## 2015-11-21 DIAGNOSIS — Z510 Encounter for antineoplastic radiation therapy: Secondary | ICD-10-CM | POA: Diagnosis not present

## 2015-11-21 LAB — CBC
HCT: 40.4 % (ref 35.0–47.0)
HEMOGLOBIN: 13.8 g/dL (ref 12.0–16.0)
MCH: 31.4 pg (ref 26.0–34.0)
MCHC: 34.1 g/dL (ref 32.0–36.0)
MCV: 92.1 fL (ref 80.0–100.0)
Platelets: 199 10*3/uL (ref 150–440)
RBC: 4.38 MIL/uL (ref 3.80–5.20)
RDW: 14 % (ref 11.5–14.5)
WBC: 4.5 10*3/uL (ref 3.6–11.0)

## 2015-11-22 ENCOUNTER — Ambulatory Visit
Admission: RE | Admit: 2015-11-22 | Discharge: 2015-11-22 | Disposition: A | Discharge status: Home / Self Care | Payer: Medicare PPO | Source: Ambulatory Visit | Attending: Radiation Oncology | Admitting: Radiation Oncology

## 2015-11-22 DIAGNOSIS — Z510 Encounter for antineoplastic radiation therapy: Secondary | ICD-10-CM | POA: Diagnosis not present

## 2015-11-23 ENCOUNTER — Telehealth: Payer: Self-pay | Admitting: *Deleted

## 2015-11-23 ENCOUNTER — Ambulatory Visit
Admission: RE | Admit: 2015-11-23 | Discharge: 2015-11-23 | Disposition: A | Discharge status: Home / Self Care | Payer: Medicare PPO | Source: Ambulatory Visit | Attending: Radiation Oncology | Admitting: Radiation Oncology

## 2015-11-23 DIAGNOSIS — Z510 Encounter for antineoplastic radiation therapy: Secondary | ICD-10-CM | POA: Diagnosis not present

## 2015-11-23 NOTE — Telephone Encounter (Signed)
Has concerns that she is on warfarin for chronic Afib and she is to start taking Tamoxifen which can interact with warfarin levels. Asking if something else can be ordered which will not interact with warfarain

## 2015-11-23 NOTE — Telephone Encounter (Signed)
She is correct in that tamoxifen can interact with Coumadin raising her INR levels. Her options are to discuss switching off Coumadin with her cardiologist, continue both medications monitoring very closely, or switching to letrozole which also has a mild interaction with Coumadin.

## 2015-11-23 NOTE — Telephone Encounter (Signed)
States she will discuss with her PCP who monitors her INR's and let us know what she decides

## 2015-11-24 ENCOUNTER — Ambulatory Visit
Admission: RE | Admit: 2015-11-24 | Discharge: 2015-11-24 | Disposition: A | Discharge status: Home / Self Care | Payer: Medicare PPO | Source: Ambulatory Visit | Attending: Radiation Oncology | Admitting: Radiation Oncology

## 2015-11-24 DIAGNOSIS — Z510 Encounter for antineoplastic radiation therapy: Secondary | ICD-10-CM | POA: Diagnosis not present

## 2015-11-25 ENCOUNTER — Ambulatory Visit
Admission: RE | Admit: 2015-11-25 | Discharge: 2015-11-25 | Disposition: A | Discharge status: Home / Self Care | Payer: Medicare PPO | Source: Ambulatory Visit | Attending: Radiation Oncology | Admitting: Radiation Oncology

## 2015-11-25 DIAGNOSIS — Z510 Encounter for antineoplastic radiation therapy: Secondary | ICD-10-CM | POA: Diagnosis not present

## 2015-12-21 ENCOUNTER — Encounter: Payer: Self-pay | Admitting: Radiation Oncology

## 2015-12-21 ENCOUNTER — Ambulatory Visit
Admission: RE | Admit: 2015-12-21 | Discharge: 2015-12-21 | Disposition: A | Discharge status: Home / Self Care | Payer: Medicare PPO | Source: Ambulatory Visit | Attending: Radiation Oncology | Admitting: Radiation Oncology

## 2015-12-21 VITALS — BP 114/70 | HR 89 | Temp 95.7°F | Resp 20 | Wt 138.9 lb

## 2015-12-21 DIAGNOSIS — Z79810 Long term (current) use of selective estrogen receptor modulators (SERMs): Secondary | ICD-10-CM | POA: Diagnosis not present

## 2015-12-21 DIAGNOSIS — Z170 Estrogen receptor positive status [ER+]: Secondary | ICD-10-CM | POA: Diagnosis not present

## 2015-12-21 DIAGNOSIS — Z923 Personal history of irradiation: Secondary | ICD-10-CM | POA: Insufficient documentation

## 2015-12-21 DIAGNOSIS — D0511 Intraductal carcinoma in situ of right breast: Secondary | ICD-10-CM

## 2015-12-21 NOTE — Progress Notes (Signed)
Radiation Oncology Follow up Note  Name: Sierra Green   Date:   12/21/2015 MRN:  [id number] DOB: [birth year]    This 75 y.o. female presents to the clinic today for follow-up one month out from whole breast radiation to her right breast for ductal carcinoma in situ.  REFERRING PROVIDER: Ezequiel Kayser, MD  HPI: Patient is a 75 year old female now out 1 month having completed radiation therapy to her right breast for ductal carcinoma in situ ER positive/PR negative. She is seen today in routine follow-up and is doing well.. She specifically denies breast tenderness cough or bone pain. She's been started on tamoxifen although is currently on Coumadin and her INR is being monitored.   COMPLICATIONS OF TREATMENT: none  FOLLOW UP COMPLIANCE: keeps appointments   PHYSICAL EXAM:  BP 114/70 mmHg  Pulse 89  Temp(Src) 95.7 F (35.4 C)  Resp 20  Wt 138 lb 14.2 oz (63 kg) Patient is status post bilateral breast reconstruction. Right breast still some hyperpigmentation the skin. No dominant mass or nodularity is noted in either breast in 2 positions examined. No axillary or supraclavicular adenopathy is appreciated. Well-developed well-nourished patient in NAD. HEENT reveals PERLA, EOMI, discs not visualized.  Oral cavity is clear. No oral mucosal lesions are identified. Neck is clear without evidence of cervical or supraclavicular adenopathy. Lungs are clear to A&P. Cardiac examination is essentially unremarkable with regular rate and rhythm without murmur rub or thrill. Abdomen is benign with no organomegaly or masses noted. Motor sensory and DTR levels are equal and symmetric in the upper and lower extremities. Cranial nerves II through XII are grossly intact. Proprioception is intact. No peripheral adenopathy or edema is identified. No motor or sensory levels are noted. Crude visual fields are within normal range.  RADIOLOGY RESULTS: No current films for review  PLAN: Present time she is doing  well. I'm please were overall progress. She is cautious about Coumadin and her tamoxifen and that is being monitored by medical oncology. I have asked to see her back in 4-5 months for follow-up. She knows to call sooner with any concerns.  I would like to take this opportunity to thank you for allowing me to participate in the care of your patient.Armstead Peaks., MD

## 2015-12-29 DIAGNOSIS — Z853 Personal history of malignant neoplasm of breast: Secondary | ICD-10-CM | POA: Insufficient documentation

## 2015-12-29 DIAGNOSIS — C50911 Malignant neoplasm of unspecified site of right female breast: Secondary | ICD-10-CM | POA: Insufficient documentation

## 2016-01-06 ENCOUNTER — Inpatient Hospital Stay: Payer: Medicare PPO | Attending: Oncology | Admitting: Oncology

## 2016-01-06 VITALS — BP 107/68 | HR 90 | Temp 98.0°F | Resp 18 | Wt 139.2 lb

## 2016-01-06 DIAGNOSIS — Z79899 Other long term (current) drug therapy: Secondary | ICD-10-CM | POA: Insufficient documentation

## 2016-01-06 DIAGNOSIS — Z7901 Long term (current) use of anticoagulants: Secondary | ICD-10-CM | POA: Diagnosis not present

## 2016-01-06 DIAGNOSIS — Z170 Estrogen receptor positive status [ER+]: Secondary | ICD-10-CM

## 2016-01-06 DIAGNOSIS — Z87891 Personal history of nicotine dependence: Secondary | ICD-10-CM | POA: Diagnosis not present

## 2016-01-06 DIAGNOSIS — D0511 Intraductal carcinoma in situ of right breast: Secondary | ICD-10-CM

## 2016-01-06 DIAGNOSIS — E559 Vitamin D deficiency, unspecified: Secondary | ICD-10-CM

## 2016-01-06 DIAGNOSIS — I340 Nonrheumatic mitral (valve) insufficiency: Secondary | ICD-10-CM | POA: Insufficient documentation

## 2016-01-06 DIAGNOSIS — Z853 Personal history of malignant neoplasm of breast: Secondary | ICD-10-CM | POA: Diagnosis not present

## 2016-01-06 DIAGNOSIS — Z9012 Acquired absence of left breast and nipple: Secondary | ICD-10-CM | POA: Insufficient documentation

## 2016-01-06 DIAGNOSIS — M818 Other osteoporosis without current pathological fracture: Secondary | ICD-10-CM | POA: Diagnosis not present

## 2016-01-06 DIAGNOSIS — I10 Essential (primary) hypertension: Secondary | ICD-10-CM | POA: Insufficient documentation

## 2016-01-06 DIAGNOSIS — I482 Chronic atrial fibrillation: Secondary | ICD-10-CM | POA: Diagnosis not present

## 2016-01-06 DIAGNOSIS — Z79810 Long term (current) use of selective estrogen receptor modulators (SERMs): Secondary | ICD-10-CM | POA: Insufficient documentation

## 2016-01-06 DIAGNOSIS — E785 Hyperlipidemia, unspecified: Secondary | ICD-10-CM | POA: Diagnosis not present

## 2016-01-06 NOTE — Progress Notes (Signed)
Offers no complaints. Finished radiation end of may. Started tamoxifen about 3 weeks ago. States is doing well with no side effects from tamoxifen at this time.

## 2016-01-06 NOTE — Progress Notes (Signed)
[place]  Telephone:(336) [phone] Fax:(336) [phone]  ID: ANGELIAH TABOR OB: 02/21/1941  MR#: [id number]  [id number]  Patient Care Team: Ezequiel Kayser, MD as PCP - General (Internal Medicine)  CHIEF COMPLAINT: Right breast DCIS.  INTERVAL HISTORY: Patient returns to clinic today for further evaluation and to assess her toleration of tamoxifen. She has now completed her XRT and tolerated it well without significant side effects. She is tolerating tamoxifen without side effects as well. She currently feels well and is asymptomatic. She has no neurologic complaints. She denies any recent fevers or illnesses. She has good appetite and denies weight loss. She denies any pain. She has no chest pain or shortness of breath. She denies any nausea, vomiting, constipation, or diarrhea. She has no urinary complaints. Patient offers no specific complaints today.   REVIEW OF SYSTEMS:   Review of Systems  Constitutional: Negative.  Negative for fever, weight loss and malaise/fatigue.  Respiratory: Negative.  Negative for shortness of breath.   Cardiovascular: Negative.  Negative for chest pain.  Gastrointestinal: Negative.  Negative for abdominal pain.  Genitourinary: Negative.   Musculoskeletal: Negative.   Neurological: Negative.  Negative for sensory change and weakness.  Endo/Heme/Allergies: Does not bruise/bleed easily.  Psychiatric/Behavioral: Negative.     As per HPI. Otherwise, a complete review of systems is negatve.  PAST MEDICAL HISTORY: Past Medical History  Diagnosis Date  . Allergy     Seasonal  . Back pain   . Chronic atrial fibrillation (HCC)     s/p 3 ablations, multiple cardioversions  . Hyperlipidemia   . Hypertension   . Osteoporosis   . Mitral valve regurgitation   . Sensorineural hearing loss (SNHL) of both ears     worst on right  . Vitamin D deficiency   . Dysrhythmia 1998    A-fib  . Cancer [facility]) 2013    left breast    PAST  SURGICAL HISTORY: Past Surgical History  Procedure Laterality Date  . Mastectomy Left 07/2011    DCIS- [place], [place]  . Augmentation mammaplasty Bilateral 10/2011  . Tonsillectomy  as child  . Tubal ligation Bilateral 1976    Laparoscopic  . Proximal interphalangeal fusion (pip) Right 05/15/2013    2nd, 3rd, and 5th digits  . Cataract extraction Bilateral Right- 05/2014; Left-06/2014  . Heart ablation      1 at [facility] and 2 at [facility]  . Breast lumpectomy with needle localization Right 09/27/2015    Procedure: BREAST LUMPECTOMY WITH NEEDLE LOCALIZATION;  Surgeon: Hubbard Robinson, MD;  Location: [facility] ORS;  Service: General;  Laterality: Right;  . Sentinel node biopsy Right 09/27/2015    Procedure: SENTINEL NODE BIOPSY;  Surgeon: Hubbard Robinson, MD;  Location: [facility] ORS;  Service: General;  Laterality: Right;    FAMILY HISTORY Family History  Problem Relation Age of Onset  . Breast cancer Neg Hx        ADVANCED DIRECTIVES:    HEALTH MAINTENANCE: Social History  Substance Use Topics  . Smoking status: Former Smoker    Quit date: 07/02/1966  . Smokeless tobacco: Never Used  . Alcohol Use: 2.4 - 3.0 oz/week    4-5 Glasses of wine per week     Colonoscopy:  PAP:  Bone density:  Lipid panel:  Allergies  Allergen Reactions  . Oxycodone Anaphylaxis  . Zoledronic Acid Other (See Comments)    Iritis  . Other Other (See Comments)    boniva Flu like symptoms  .  Alendronate Other (See Comments)    Flu like symptoms  . Honey Bee Venom Swelling    Marked local swelling    Current Outpatient Prescriptions  Medication Sig Dispense Refill  . Cholecalciferol (VITAMIN D3) 5000 units TABS Take 1 tablet by mouth daily. For 5 days per week    . diltiazem (TAZTIA XT) 120 MG 24 hr capsule Take 1 tablet by mouth 2 (two) times daily.    . fexofenadine (ALLEGRA) 180 MG tablet Take 1 tablet by mouth daily as needed.    . Red Yeast Rice Extract 600 MG CAPS  Take 1 tablet by mouth daily.    . tamoxifen (NOLVADEX) 20 MG tablet Take 1 tablet (20 mg total) by mouth daily. 90 tablet 3  . vitamin C (ASCORBIC ACID) 500 MG tablet Take 1 tablet by mouth every other day.    . warfarin (COUMADIN) 5 MG tablet Take by mouth daily. 7.5mg  on Monday and Friday; 5mg  all other days     No current facility-administered medications for this visit.    OBJECTIVE: Filed Vitals:   01/06/16 0959  BP: 107/68  Pulse: 90  Temp: 98 F (36.7 C)  Resp: 18     Body mass index is 23.48 kg/(m^2).    ECOG FS:0 - Asymptomatic  General: Well-developed, well-nourished, no acute distress. Eyes: Pink conjunctiva, anicteric sclera. Breasts: Patient requested exam be deferred today. Lungs: Clear to auscultation bilaterally. Heart: Regular rate and rhythm. No rubs, murmurs, or gallops. Abdomen: Soft, nontender, nondistended. No organomegaly noted, normoactive bowel sounds. Musculoskeletal: No edema, cyanosis, or clubbing. Neuro: Alert, answering all questions appropriately. Cranial nerves grossly intact. Skin: No rashes or petechiae noted. Psych: Normal affect.   LAB RESULTS:  Lab Results  Component Value Date   NA 136 09/19/2015   K 4.4 09/19/2015   CL 104 09/19/2015   CO2 26 09/19/2015   GLUCOSE 98 09/19/2015   BUN 18 09/19/2015   CREATININE 0.63 09/19/2015   CALCIUM 9.3 09/19/2015   PROT 7.3 09/19/2015   ALBUMIN 4.4 09/19/2015   AST 25 09/19/2015   ALT 16 09/19/2015   ALKPHOS 67 09/19/2015   BILITOT 0.9 09/19/2015   GFRNONAA >60 09/19/2015   GFRAA >60 09/19/2015    Lab Results  Component Value Date   WBC 4.5 11/21/2015   NEUTROABS 4.9 09/19/2015   HGB 13.8 11/21/2015   HCT 40.4 11/21/2015   MCV 92.1 11/21/2015   PLT 199 11/21/2015     STUDIES: No results found.  ASSESSMENT: Right breast DCIS.  PLAN:    1. Right breast DCIS: Patient was diagnosed with DCIS in her left breast 4 years ago, but did not take adjuvant hormonal therapy. Patient  had a lumpectomy with focally positive margins, but proceeded with XRT which she completed approximately one month ago. Patient expressed concern of the interaction between tamoxifen and Coumadin, but ultimately has decided to discontinue both medications and will have her INR checked more frequently by her primary care physician. Continue tamoxifen for 5 years completing in June 2022. Patient will require a mammogram in approximately September 2017. This can be ordered at her next clinic visit.  No further intervention is needed at this time. Return to clinic in 3 months for further evaluation. 2. Anticoagulation: Continue Coumadin. Patient states that she cannot take Xarelto or Eliquis. She agreed to continue tamoxifen rather than switching to an aromatase inhibitor. Continue INR checks per primary care. 3. Risk of endometrial cancer: Although small while taking tamoxifen, it is  recommended that patient continue with annual or biannual exams at the discretion of her primary care physician.  Patient expressed understanding and was in agreement with this plan. She also understands that She can call clinic at any time with any questions, concerns, or complaints.   Approximately 30 minutes was spent in discussion of which greater than 50% was consultation.  Ductal carcinoma in situ (DCIS) of right breast   Staging form: Breast, AJCC 7th Edition     Clinical stage from 10/16/2015: Stage 0 (Tis (DCIS), N0, M0) - Signed by Lloyd Huger, MD on 10/16/2015   Lloyd Huger, MD   01/06/2016 10:15 AM

## 2016-04-20 ENCOUNTER — Ambulatory Visit: Payer: Medicare PPO | Admitting: Oncology

## 2016-04-25 NOTE — Progress Notes (Signed)
[place]  Telephone:(336) [phone] Fax:(336) [phone]  ID: Sierra Green OB: September 27, 1940  MR#: [id number]  [id number]  Patient Care Team: Ezequiel Kayser, MD as PCP - General (Internal Medicine)  CHIEF COMPLAINT: Right breast DCIS.  INTERVAL HISTORY: Patient returns to clinic today for routine evaluation.  She is tolerating tamoxifen well without significant side effects. She currently feels well and is asymptomatic. She has no neurologic complaints. She denies any recent fevers or illnesses. She has a good appetite and denies weight loss. She denies any pain. She has no chest pain or shortness of breath. She denies any nausea, vomiting, constipation, or diarrhea. She has no urinary complaints. Patient offers no specific complaints today.   REVIEW OF SYSTEMS:   Review of Systems  Constitutional: Negative.  Negative for fever, malaise/fatigue and weight loss.  Respiratory: Negative.  Negative for shortness of breath.   Cardiovascular: Negative.  Negative for chest pain.  Gastrointestinal: Negative.  Negative for abdominal pain.  Genitourinary: Negative.   Musculoskeletal: Negative.   Neurological: Negative.  Negative for sensory change and weakness.  Endo/Heme/Allergies: Does not bruise/bleed easily.  Psychiatric/Behavioral: Negative.    As per HPI. Otherwise, a complete review of systems is negative.   PAST MEDICAL HISTORY: Past Medical History:  Diagnosis Date  . Allergy    Seasonal  . Back pain   . Cancer [facility]) 2013   left breast  . Chronic atrial fibrillation ([place])    s/p 3 ablations, multiple cardioversions  . Dysrhythmia 1998   A-fib  . Hyperlipidemia   . Hypertension   . Mitral valve regurgitation   . Osteoporosis   . Sensorineural hearing loss (SNHL) of both ears    worst on right  . Vitamin D deficiency     PAST SURGICAL HISTORY: Past Surgical History:  Procedure Laterality Date  . AUGMENTATION MAMMAPLASTY Bilateral 10/2011  .  BREAST LUMPECTOMY WITH NEEDLE LOCALIZATION Right 09/27/2015   Procedure: BREAST LUMPECTOMY WITH NEEDLE LOCALIZATION;  Surgeon: Hubbard Robinson, MD;  Location: [facility] ORS;  Service: General;  Laterality: Right;  . CATARACT EXTRACTION Bilateral Right- 05/2014; Left-06/2014  . heart ablation     1 at [facility] and 2 at [facility]  . MASTECTOMY Left 07/2011   DCIS- [place], [place]  . PROXIMAL INTERPHALANGEAL FUSION (PIP) Right 05/15/2013   2nd, 3rd, and 5th digits  . SENTINEL NODE BIOPSY Right 09/27/2015   Procedure: SENTINEL NODE BIOPSY;  Surgeon: Hubbard Robinson, MD;  Location: [facility] ORS;  Service: General;  Laterality: Right;  . TONSILLECTOMY  as child  . TUBAL LIGATION Bilateral 1976   Laparoscopic    FAMILY HISTORY Family History  Problem Relation Age of Onset  . Breast cancer Neg Hx        ADVANCED DIRECTIVES:    HEALTH MAINTENANCE: Social History  Substance Use Topics  . Smoking status: Former Smoker    Quit date: 07/02/1966  . Smokeless tobacco: Never Used  . Alcohol use 2.4 - 3.0 oz/week    4 - 5 Glasses of wine per week     Colonoscopy:  PAP:  Bone density:  Lipid panel:  Allergies  Allergen Reactions  . Oxycodone Anaphylaxis  . Zoledronic Acid Other (See Comments)    Iritis  . Other Other (See Comments)    boniva Flu like symptoms  . Alendronate Other (See Comments)    Flu like symptoms  . Honey Bee Venom Swelling    Marked local swelling    Current Outpatient  Prescriptions  Medication Sig Dispense Refill  . Cholecalciferol (VITAMIN D3) 5000 units TABS Take 1 tablet by mouth daily. For 5 days per week    . diltiazem (TAZTIA XT) 120 MG 24 hr capsule Take 1 tablet by mouth 2 (two) times daily.    . fexofenadine (ALLEGRA) 180 MG tablet Take 1 tablet by mouth daily as needed.    . Red Yeast Rice Extract 600 MG CAPS Take 1 tablet by mouth daily.    . tamoxifen (NOLVADEX) 20 MG tablet Take 1 tablet (20 mg total) by mouth daily. 90 tablet 3  .  vitamin C (ASCORBIC ACID) 500 MG tablet Take 1 tablet by mouth every other day.    . warfarin (COUMADIN) 5 MG tablet Take by mouth daily. 7.5mg  on Monday and Friday; 5mg  all other days     No current facility-administered medications for this visit.     OBJECTIVE: Vitals:   04/27/16 0946  BP: 134/80  Pulse: (!) 105  Temp: 97.7 F (36.5 C)     Body mass index is 23.55 kg/m.    ECOG FS:0 - Asymptomatic  General: Well-developed, well-nourished, no acute distress. Eyes: Pink conjunctiva, anicteric sclera. Breasts: Patient requested exam be deferred today. Lungs: Clear to auscultation bilaterally. Heart: Regular rate and rhythm. No rubs, murmurs, or gallops. Abdomen: Soft, nontender, nondistended. No organomegaly noted, normoactive bowel sounds. Musculoskeletal: No edema, cyanosis, or clubbing. Neuro: Alert, answering all questions appropriately. Cranial nerves grossly intact. Skin: No rashes or petechiae noted. Psych: Normal affect.   LAB RESULTS:  Lab Results  Component Value Date   NA 136 09/19/2015   K 4.4 09/19/2015   CL 104 09/19/2015   CO2 26 09/19/2015   GLUCOSE 98 09/19/2015   BUN 18 09/19/2015   CREATININE 0.63 09/19/2015   CALCIUM 9.3 09/19/2015   PROT 7.3 09/19/2015   ALBUMIN 4.4 09/19/2015   AST 25 09/19/2015   ALT 16 09/19/2015   ALKPHOS 67 09/19/2015   BILITOT 0.9 09/19/2015   GFRNONAA >60 09/19/2015   GFRAA >60 09/19/2015    Lab Results  Component Value Date   WBC 4.5 11/21/2015   NEUTROABS 4.9 09/19/2015   HGB 13.8 11/21/2015   HCT 40.4 11/21/2015   MCV 92.1 11/21/2015   PLT 199 11/21/2015     STUDIES: No results found.  ASSESSMENT: Right breast DCIS.  PLAN:    1. 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 Patient had a lumpectomy with focally positive margins, but proceeded with XRT. Patient expressed concern of the interaction between  tamoxifen and Coumadin, but ultimately has decided to continue both medications and is having her INR checked more frequently by her primary care physician. Continue tamoxifen for 5 years completing in June 2022. Return to clinic in 6 months for further evaluation. 2. Anticoagulation: Continue Coumadin. Patient states that she cannot take Xarelto or Eliquis. She agreed to continue tamoxifen rather than switching to an aromatase inhibitor. Continue INR checks per primary care. 3. Risk of endometrial cancer: Although small while taking tamoxifen, it is recommended that patient continue with annual or biannual exams at the discretion of her primary care physician. Patient states she has not had an exam in over 3 years.  Patient expressed understanding and was in agreement with this plan. She also understands that She can call clinic at any time with any questions, concerns, or complaints.    Ductal carcinoma in  situ (DCIS) of right breast   Staging form: Breast, AJCC 7th Edition     Clinical stage from 10/16/2015: Stage 0 (Tis (DCIS), N0, M0) - Signed by Lloyd Huger, MD on 10/16/2015   Lloyd Huger, MD   04/27/2016 9:19 PM

## 2016-04-27 ENCOUNTER — Inpatient Hospital Stay: Payer: Medicare PPO | Attending: Oncology | Admitting: Oncology

## 2016-04-27 VITALS — BP 134/80 | HR 105 | Temp 97.7°F | Wt 139.6 lb

## 2016-04-27 DIAGNOSIS — I482 Chronic atrial fibrillation: Secondary | ICD-10-CM | POA: Diagnosis not present

## 2016-04-27 DIAGNOSIS — Z7901 Long term (current) use of anticoagulants: Secondary | ICD-10-CM | POA: Insufficient documentation

## 2016-04-27 DIAGNOSIS — Z79899 Other long term (current) drug therapy: Secondary | ICD-10-CM | POA: Insufficient documentation

## 2016-04-27 DIAGNOSIS — M549 Dorsalgia, unspecified: Secondary | ICD-10-CM | POA: Insufficient documentation

## 2016-04-27 DIAGNOSIS — E785 Hyperlipidemia, unspecified: Secondary | ICD-10-CM | POA: Insufficient documentation

## 2016-04-27 DIAGNOSIS — I340 Nonrheumatic mitral (valve) insufficiency: Secondary | ICD-10-CM

## 2016-04-27 DIAGNOSIS — I10 Essential (primary) hypertension: Secondary | ICD-10-CM | POA: Insufficient documentation

## 2016-04-27 DIAGNOSIS — Z87891 Personal history of nicotine dependence: Secondary | ICD-10-CM | POA: Diagnosis not present

## 2016-04-27 DIAGNOSIS — M818 Other osteoporosis without current pathological fracture: Secondary | ICD-10-CM

## 2016-04-27 DIAGNOSIS — Z170 Estrogen receptor positive status [ER+]: Secondary | ICD-10-CM | POA: Diagnosis not present

## 2016-04-27 DIAGNOSIS — D0511 Intraductal carcinoma in situ of right breast: Secondary | ICD-10-CM

## 2016-04-27 DIAGNOSIS — Z79810 Long term (current) use of selective estrogen receptor modulators (SERMs): Secondary | ICD-10-CM | POA: Insufficient documentation

## 2016-04-27 DIAGNOSIS — D559 Anemia due to enzyme disorder, unspecified: Secondary | ICD-10-CM | POA: Diagnosis not present

## 2016-04-27 NOTE — Progress Notes (Signed)
States has mild anxiety and swelling in ankles. Has breast exams performed by Dr. Baruch Gouty.

## 2016-05-16 ENCOUNTER — Encounter: Payer: Self-pay | Admitting: Radiation Oncology

## 2016-05-16 ENCOUNTER — Ambulatory Visit
Admission: RE | Admit: 2016-05-16 | Discharge: 2016-05-16 | Disposition: A | Discharge status: Home / Self Care | Payer: Medicare PPO | Source: Ambulatory Visit | Attending: Radiation Oncology | Admitting: Radiation Oncology

## 2016-05-16 VITALS — BP 119/86 | HR 89 | Temp 97.0°F | Resp 20 | Wt 137.1 lb

## 2016-05-16 DIAGNOSIS — Z79810 Long term (current) use of selective estrogen receptor modulators (SERMs): Secondary | ICD-10-CM | POA: Diagnosis not present

## 2016-05-16 DIAGNOSIS — Z923 Personal history of irradiation: Secondary | ICD-10-CM | POA: Diagnosis not present

## 2016-05-16 DIAGNOSIS — Z170 Estrogen receptor positive status [ER+]: Secondary | ICD-10-CM | POA: Diagnosis not present

## 2016-05-16 DIAGNOSIS — Z7901 Long term (current) use of anticoagulants: Secondary | ICD-10-CM | POA: Diagnosis not present

## 2016-05-16 DIAGNOSIS — D0511 Intraductal carcinoma in situ of right breast: Secondary | ICD-10-CM | POA: Insufficient documentation

## 2016-05-16 NOTE — Progress Notes (Signed)
Radiation Oncology Follow up Note  Name: Sierra Green   Date:   05/16/2016 MRN:  [id number] DOB: [birth year]    This 75 y.o. female presents to the clinic today for six-month follow-up status post whole breast radiation to her right breast for ductal carcinoma in situ.  REFERRING PROVIDER: Ezequiel Kayser, MD  HPI: Patient is a 75 year old female now out 6 months having completed whole breast radiation to her right breast for ductal carcinoma in situ ER/PR positive PR negative. She is seen today in routine follow-up and is doing well. She is currently on tamoxifen as well as Coumadin levels which have been decreased based on her monitoring of her INR. She specifically denies breast tenderness cough or bone pain. She's not yet had a follow-up mammogram.  COMPLICATIONS OF TREATMENT: none  FOLLOW UP COMPLIANCE: keeps appointments   PHYSICAL EXAM:  BP 119/86   Pulse 89   Temp 97 F (36.1 C)   Resp 20   Wt 137 lb 2 oz (62.2 kg)   BMI 23.13 kg/m  Lungs are clear to A&P cardiac examination essentially unremarkable with regular rate and rhythm. No dominant mass or nodularity is noted in either breast in 2 positions examined. Incision is well-healed. No axillary or supraclavicular adenopathy is appreciated. Cosmetic result is excellent. Well-developed well-nourished patient in NAD. HEENT reveals PERLA, EOMI, discs not visualized.  Oral cavity is clear. No oral mucosal lesions are identified. Neck is clear without evidence of cervical or supraclavicular adenopathy. Lungs are clear to A&P. Cardiac examination is essentially unremarkable with regular rate and rhythm without murmur rub or thrill. Abdomen is benign with no organomegaly or masses noted. Motor sensory and DTR levels are equal and symmetric in the upper and lower extremities. Cranial nerves II through XII are grossly intact. Proprioception is intact. No peripheral adenopathy or edema is identified. No motor or sensory levels are noted.  Crude visual fields are within normal range.  RADIOLOGY RESULTS: I've asked patient to have follow-up mammograms ordered in the next several months which I will review.  PLAN: Present time patient is unsure one her insurance will approve her follow-up mammograms although but I've expressed to her the need for these tests to be performed. She will be seeing medical oncology and probably have follow-up mammograms ordered through their department. Otherwise I'm please were overall progress. I've asked to see around 6 months for follow-up. She continues on tamoxifen without side effect. Patient knows to call with any concerns.  I would like to take this opportunity to thank you for allowing me to participate in the care of your patient.Armstead Peaks., MD

## 2016-11-28 ENCOUNTER — Other Ambulatory Visit: Payer: Self-pay | Admitting: *Deleted

## 2016-11-28 ENCOUNTER — Encounter: Payer: Self-pay | Admitting: Radiation Oncology

## 2016-11-28 ENCOUNTER — Ambulatory Visit
Admission: RE | Admit: 2016-11-28 | Discharge: 2016-11-28 | Disposition: A | Discharge status: Home / Self Care | Payer: Medicare HMO | Source: Ambulatory Visit | Attending: Radiation Oncology | Admitting: Radiation Oncology

## 2016-11-28 VITALS — BP 146/86 | HR 86 | Temp 97.4°F | Resp 20 | Wt 135.0 lb

## 2016-11-28 DIAGNOSIS — Z923 Personal history of irradiation: Secondary | ICD-10-CM | POA: Insufficient documentation

## 2016-11-28 DIAGNOSIS — Z79810 Long term (current) use of selective estrogen receptor modulators (SERMs): Secondary | ICD-10-CM | POA: Insufficient documentation

## 2016-11-28 DIAGNOSIS — D0511 Intraductal carcinoma in situ of right breast: Secondary | ICD-10-CM

## 2016-11-28 DIAGNOSIS — Z170 Estrogen receptor positive status [ER+]: Secondary | ICD-10-CM | POA: Diagnosis not present

## 2016-11-28 NOTE — Progress Notes (Signed)
Radiation Oncology Follow up Note  Name: Sierra Green   Date:   11/28/2016 MRN:  [id number] DOB: 12-15-1940    This 76 y.o. female presents to the clinic today for 1 year follow-up status post whole breast radiation to her right breast for ductal carcinoma in situ.  REFERRING PROVIDER: Ezequiel Kayser, MD  HPI: Patient is a 76 year old female now out 1 year having completed whole breast radiation to her right breast for positive PR negative ductal carcinoma in situ. She seen today in routine follow-up is doing well she is currently on tamoxifen and tolerating that well. She specifically denies breast tenderness cough or bone pain. She has not had diagnostic mammograms in a year.  COMPLICATIONS OF TREATMENT: none  FOLLOW UP COMPLIANCE: keeps appointments   PHYSICAL EXAM:  BP (!) 146/86   Pulse 86   Temp 97.4 F (36.3 C)   Resp 20   Wt 135 lb 0.5 oz (61.3 kg)   BMI 22.77 kg/m  Patient is had bilateral breast reconstruction. No evidence of mass or nodularity is noted in either breast. No axillary or supraclavicular adenopathy is appreciated. Well-developed well-nourished patient in NAD. HEENT reveals PERLA, EOMI, discs not visualized.  Oral cavity is clear. No oral mucosal lesions are identified. Neck is clear without evidence of cervical or supraclavicular adenopathy. Lungs are clear to A&P. Cardiac examination is essentially unremarkable with regular rate and rhythm without murmur rub or thrill. Abdomen is benign with no organomegaly or masses noted. Motor sensory and DTR levels are equal and symmetric in the upper and lower extremities. Cranial nerves II through XII are grossly intact. Proprioception is intact. No peripheral adenopathy or edema is identified. No motor or sensory levels are noted. Crude visual fields are within normal range.  RADIOLOGY RESULTS: Diagnostic mammograms of both breasts ordered  PLAN: Present time patient is doing well I've ordered diagnostic mammograms  of both breasts. I've asked to see her back in 1 year for follow-up. She continues to do it do well with no evidence of disease. Patient is to call with any concerns.  I would like to take this opportunity to thank you for allowing me to participate in the care of your patient.Armstead Peaks., MD

## 2016-12-12 ENCOUNTER — Other Ambulatory Visit: Payer: Self-pay | Admitting: *Deleted

## 2016-12-12 MED ORDER — TAMOXIFEN CITRATE 20 MG PO TABS: 20.0000 mg | ORAL_TABLET | Freq: Every day | ORAL | 0 refills | Status: DC

## 2016-12-12 NOTE — Telephone Encounter (Signed)
After reading ON from 04/27/16, she was to return in 6 months, but I do not see that seh was ever scheduled for a fu, I have Colette looking into this for me

## 2016-12-12 NOTE — Telephone Encounter (Signed)
Patient will come after her mammo in July, 1 refill given for #90

## 2016-12-12 NOTE — Telephone Encounter (Signed)
Patient last seen 04/27/16 and has no FU appt scheduled. Please advise

## 2016-12-14 ENCOUNTER — Ambulatory Visit: Payer: Medicare HMO | Admitting: Oncology

## 2017-01-10 ENCOUNTER — Ambulatory Visit
Admission: RE | Admit: 2017-01-10 | Discharge: 2017-01-10 | Disposition: A | Discharge status: Home / Self Care | Payer: Medicare HMO | Source: Ambulatory Visit | Attending: Radiation Oncology | Admitting: Radiation Oncology

## 2017-01-10 DIAGNOSIS — D0511 Intraductal carcinoma in situ of right breast: Secondary | ICD-10-CM | POA: Diagnosis not present

## 2017-01-10 HISTORY — DX: Personal history of irradiation: Z92.3

## 2017-01-11 IMAGING — MG MM DIGITAL DIAGNOSTIC UNILAT*R*
2 series · 2 of 2 positions shown · non-contrast
Comparison: Previous exam(s).

CLINICAL DATA: Evaluate clip placement following ultrasound-guided
right breast biopsy.

EXAM:
DIAGNOSTIC RIGHT MAMMOGRAM POST ULTRASOUND BIOPSY

[R CC]
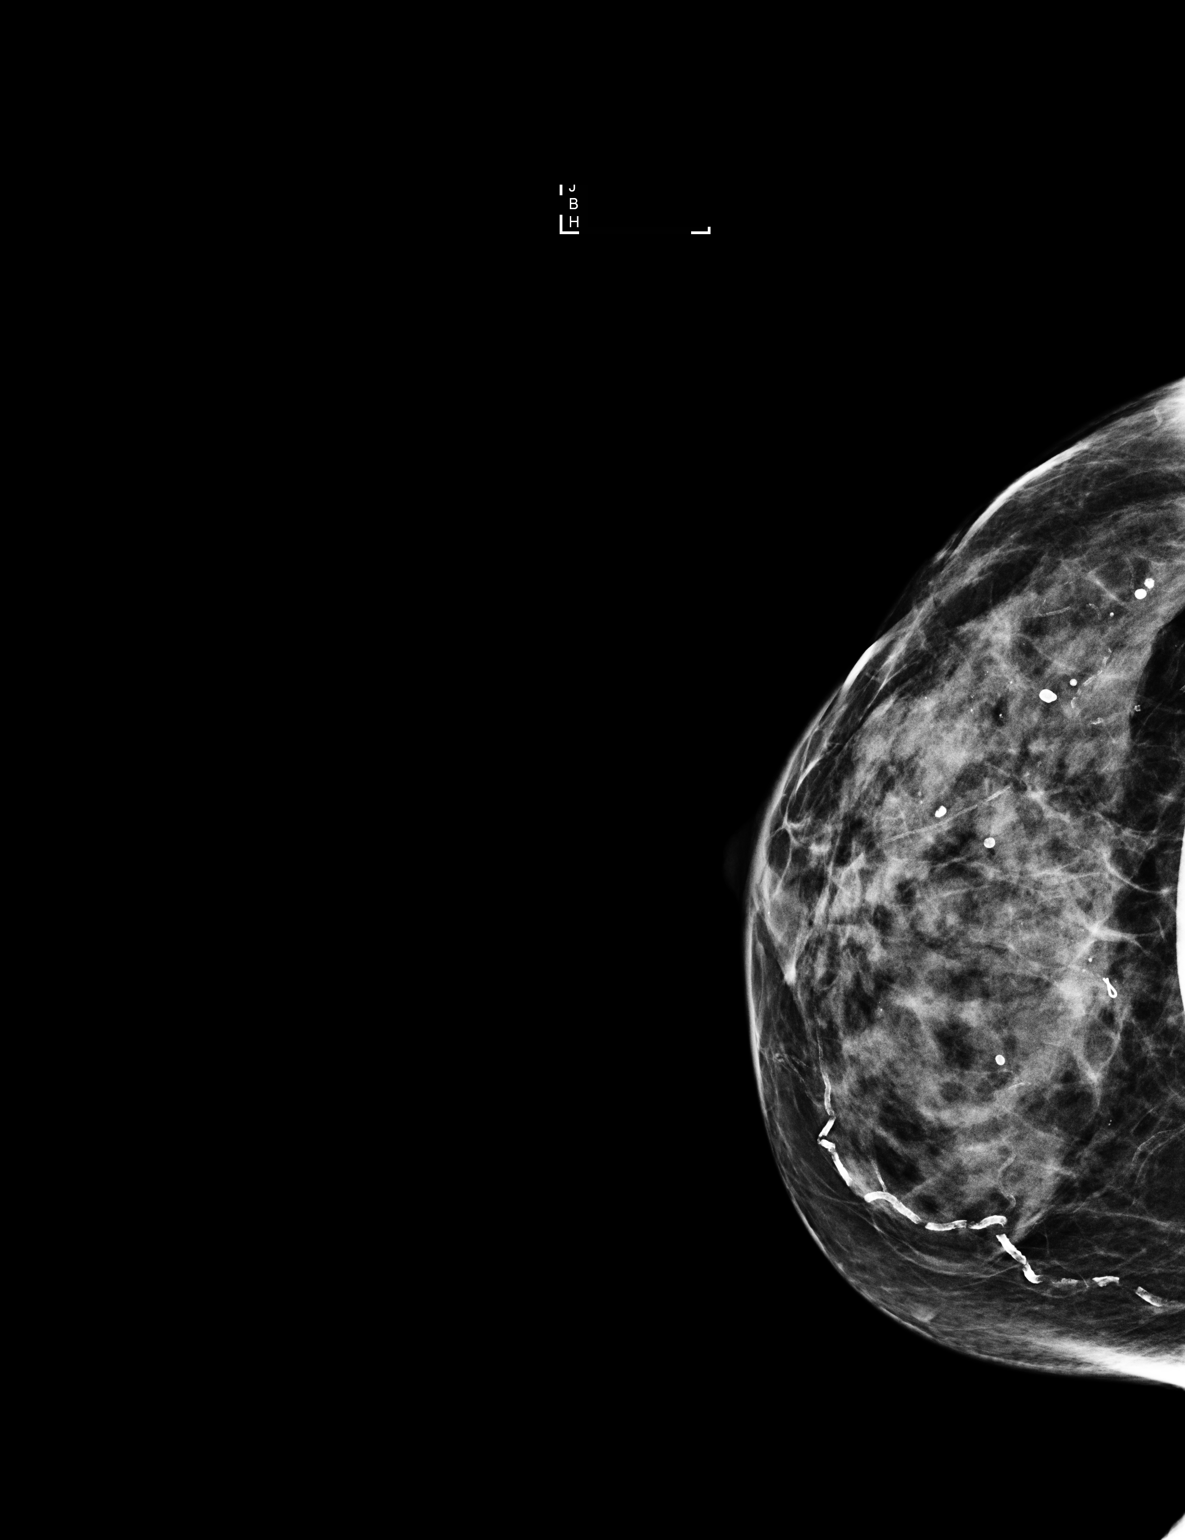

[R ML]
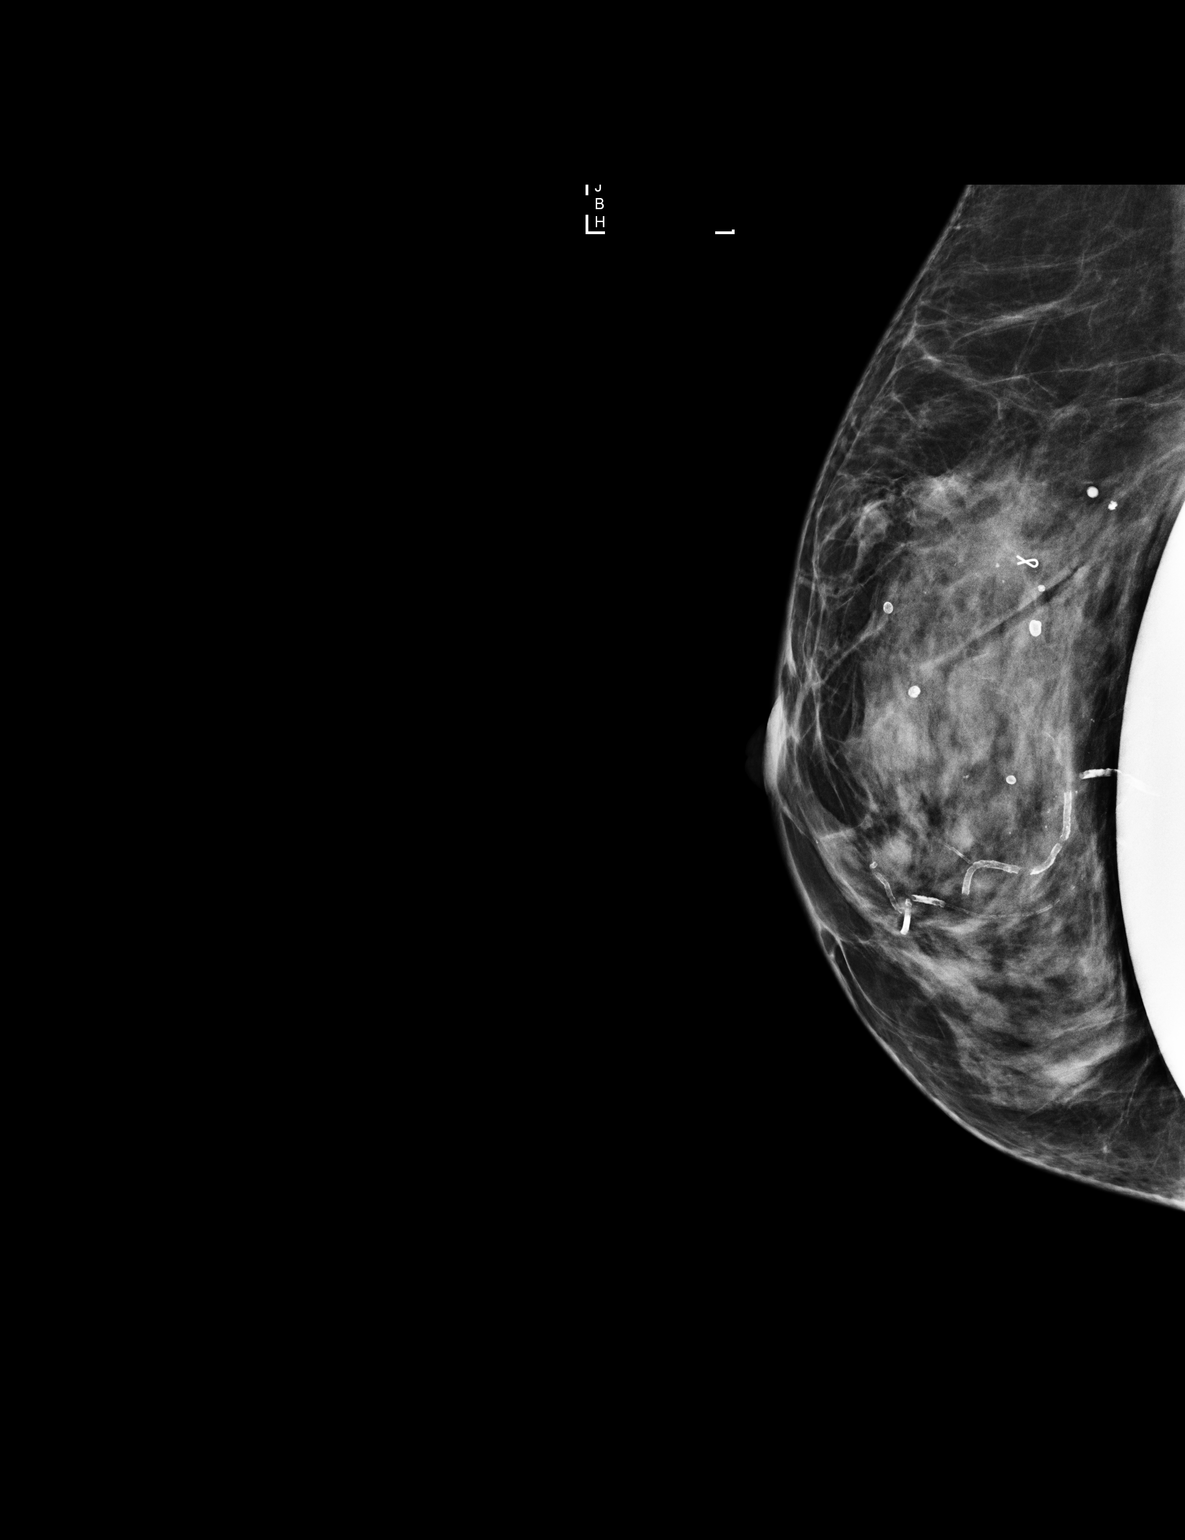

[2 of 2 positions shown; findings below may reference images not displayed]

FINDINGS: Mammographic images were obtained following ultrasound guided biopsy
of 1.8 cm mass at the [DATE] position of the right breast..

The ribbon shaped biopsy clip is in satisfactory position.
IMPRESSION: Satisfactory clip position following ultrasound-guided right breast
biopsy.

Final Assessment: Post Procedure Mammograms for Marker Placement

## 2017-01-22 ENCOUNTER — Other Ambulatory Visit: Payer: Medicare HMO

## 2017-01-24 NOTE — Progress Notes (Signed)
[place]  Telephone:(336) [phone] Fax:(336) [phone]  ID: Sierra Green OB: [birth year]  MR#: [id number]  QMV#:[id number]  Patient Care Team: Ezequiel Kayser, MD as PCP - General (Internal Medicine)  CHIEF COMPLAINT: Right breast DCIS.  INTERVAL HISTORY: Patient returns to clinic today for routine six-month evaluation.  She is tolerating tamoxifen well without significant side effects. She currently feels well and is asymptomatic. She has no neurologic complaints. She denies any recent fevers or illnesses. She has a good appetite and denies weight loss. She denies any pain. She has no chest pain or shortness of breath. She denies any nausea, vomiting, constipation, or diarrhea. She has no urinary complaints. Patient offers no specific complaints today.   REVIEW OF SYSTEMS:   Review of Systems  Constitutional: Negative.  Negative for fever, malaise/fatigue and weight loss.  Respiratory: Negative.  Negative for shortness of breath.   Cardiovascular: Negative.  Negative for chest pain.  Gastrointestinal: Negative.  Negative for abdominal pain.  Genitourinary: Negative.   Musculoskeletal: Negative.   Neurological: Negative.  Negative for sensory change and weakness.  Endo/Heme/Allergies: Does not bruise/bleed easily.  Psychiatric/Behavioral: Negative.    As per HPI. Otherwise, a complete review of systems is negative.   PAST MEDICAL HISTORY: Past Medical History:  Diagnosis Date  . Allergy    Seasonal  . Back pain   . Cancer [facility]) 2013   left breast  . Chronic atrial fibrillation ([place])    s/p 3 ablations, multiple cardioversions  . Dysrhythmia 1998   A-fib  . Hyperlipidemia   . Hypertension   . Mitral valve regurgitation   . Osteoporosis   . Personal history of radiation therapy   . Sensorineural hearing loss (SNHL) of both ears    worst on right  . Vitamin D deficiency     PAST SURGICAL HISTORY: Past Surgical History:  Procedure Laterality Date   . AUGMENTATION MAMMAPLASTY Bilateral 10/2011  . BREAST BIOPSY Right 08/2015   US guided biopsy - DCIS  . BREAST BIOPSY Left 2013   Stereotactic biopsy - [place]  . BREAST LUMPECTOMY Right 2017  . BREAST LUMPECTOMY WITH NEEDLE LOCALIZATION Right 09/27/2015   Procedure: BREAST LUMPECTOMY WITH NEEDLE LOCALIZATION;  Surgeon: Hubbard Robinson, MD;  Location: [facility] ORS;  Service: General;  Laterality: Right;  . CATARACT EXTRACTION Bilateral Right- 05/2014; Left-06/2014  . heart ablation     1 at [facility] and 2 at [facility]  . MASTECTOMY Left 07/2011   DCIS- [place], [place]  . PROXIMAL INTERPHALANGEAL FUSION (PIP) Right 05/15/2013   2nd, 3rd, and 5th digits  . SENTINEL NODE BIOPSY Right 09/27/2015   Procedure: SENTINEL NODE BIOPSY;  Surgeon: Hubbard Robinson, MD;  Location: [facility] ORS;  Service: General;  Laterality: Right;  . TONSILLECTOMY  as child  . TUBAL LIGATION Bilateral 1976   Laparoscopic    FAMILY HISTORY Family History  Problem Relation Age of Onset  . Breast cancer Neg Hx        ADVANCED DIRECTIVES:    HEALTH MAINTENANCE: Social History  Substance Use Topics  . Smoking status: Former Smoker    Quit date: 07/02/1966  . Smokeless tobacco: Never Used  . Alcohol use 2.4 - 3.0 oz/week    4 - 5 Glasses of wine per week     Colonoscopy:  PAP:  Bone density:  Lipid panel:  Allergies  Allergen Reactions  . Oxycodone Anaphylaxis  . Zoledronic Acid Other (See Comments)    Iritis  .  Ibandronic Acid Other (See Comments)    Flu like symptoms  . Other Other (See Comments)    boniva Flu like symptoms  . Alendronate Other (See Comments)    Flu like symptoms  . Honey Bee Venom Swelling    Marked local swelling    Current Outpatient Prescriptions  Medication Sig Dispense Refill  . Cholecalciferol (VITAMIN D3) 5000 units TABS Take 1 tablet by mouth daily. For 5 days per week    . diltiazem (TAZTIA XT) 120 MG 24 hr capsule Take 1 tablet by mouth 2 (two) times  daily.    . fexofenadine (ALLEGRA) 180 MG tablet Take 1 tablet by mouth daily as needed.    . Red Yeast Rice Extract 600 MG CAPS Take 1 tablet by mouth daily.    . tamoxifen (NOLVADEX) 20 MG tablet Take 1 tablet (20 mg total) by mouth daily. 90 tablet 0  . vitamin C (ASCORBIC ACID) 500 MG tablet Take 1 tablet by mouth every other day.    . warfarin (COUMADIN) 5 MG tablet Take by mouth daily. 7.5mg  on Monday and Friday; 5mg  all other days     No current facility-administered medications for this visit.     OBJECTIVE: There were no vitals filed for this visit.   There is no height or weight on file to calculate BMI.    ECOG FS:0 - Asymptomatic  General: Well-developed, well-nourished, no acute distress. Eyes: Pink conjunctiva, anicteric sclera. Breasts: Patient requested exam be deferred today. Lungs: Clear to auscultation bilaterally. Heart: Regular rate and rhythm. No rubs, murmurs, or gallops. Abdomen: Soft, nontender, nondistended. No organomegaly noted, normoactive bowel sounds. Musculoskeletal: No edema, cyanosis, or clubbing. Neuro: Alert, answering all questions appropriately. Cranial nerves grossly intact. Skin: No rashes or petechiae noted. Psych: Normal affect.   LAB RESULTS:  Lab Results  Component Value Date   NA 136 09/19/2015   K 4.4 09/19/2015   CL 104 09/19/2015   CO2 26 09/19/2015   GLUCOSE 98 09/19/2015   BUN 18 09/19/2015   CREATININE 0.63 09/19/2015   CALCIUM 9.3 09/19/2015   PROT 7.3 09/19/2015   ALBUMIN 4.4 09/19/2015   AST 25 09/19/2015   ALT 16 09/19/2015   ALKPHOS 67 09/19/2015   BILITOT 0.9 09/19/2015   GFRNONAA >60 09/19/2015   GFRAA >60 09/19/2015    Lab Results  Component Value Date   WBC 4.5 11/21/2015   NEUTROABS 4.9 09/19/2015   HGB 13.8 11/21/2015   HCT 40.4 11/21/2015   MCV 92.1 11/21/2015   PLT 199 11/21/2015     STUDIES: Mm Diag Breast W/implant Tomo Uni R  Result Date: 01/10/2017 CLINICAL DATA:  76 year old female status  post right lumpectomy in March 2017 for high grade ductal carcinoma in situ. Margins were focally positive. The patient received whole breast radiation with boost to her scar bed. She is currently asymptomatic. She has had a left mastectomy for breast cancer. EXAM: 2D DIGITAL DIAGNOSTIC RIGHT MAMMOGRAM WITH IMPLANTS, CAD AND ADJUNCT TOMO The patient has a retropectoral silicone implant. Standard and implant displaced views were performed. COMPARISON:  Previous exam(s). ACR Breast Density Category c: The breast tissue is heterogeneously dense, which may obscure small masses. FINDINGS: Posttreatment changes are seen of the right breast. No new or suspicious abnormality is identified within the right breast. Mammographic images were processed with CAD. IMPRESSION: No mammographic evidence of malignancy. RECOMMENDATION: Bilateral diagnostic mammogram in 1 year. I have discussed the findings and recommendations with the patient. Results were also  provided in writing at the conclusion of the visit. If applicable, a reminder letter will be sent to the patient regarding the next appointment. BI-RADS CATEGORY  2: Benign. Electronically Signed   By: Pamelia Hoit M.D.   On: 01/10/2017 14:34    ASSESSMENT: Right breast DCIS.  PLAN:    1. 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 Patient had a lumpectomy with focally positive margins, but proceeded with XRT. Patient expressed concern of the interaction between tamoxifen and Coumadin, but ultimately has decided to continue both medications and is having her INR checked more frequently by her primary care physician. Her most recent mammogram on January 10, 2017 was reported as BI-RADS 2, repeat in July 2019. Continue tamoxifen for 5 years completing in June 2022. Return to clinic in 6 months for further evaluation. 2. Anticoagulation: Continue Coumadin. Patient states that she cannot  take Xarelto or Eliquis. Continue INR checks per primary care. 3. Risk of endometrial cancer: Although small while taking tamoxifen, it is recommended that patient continue with annual or biannual exams at the discretion of her primary care physician. Patient works she has an appointment in the next several weeks.  Patient expressed understanding and was in agreement with this plan. She also understands that She can call clinic at any time with any questions, concerns, or complaints.    Ductal carcinoma in situ (DCIS) of right breast   Staging form: Breast, AJCC 7th Edition     Clinical stage from 10/16/2015: Stage 0 (Tis (DCIS), N0, M0) - Signed by Lloyd Huger, MD on 10/16/2015   Lloyd Huger, MD   01/24/2017 10:06 PM

## 2017-01-25 ENCOUNTER — Inpatient Hospital Stay: Payer: Medicare HMO | Attending: Oncology | Admitting: Oncology

## 2017-01-25 VITALS — BP 123/78 | HR 123 | Temp 97.1°F | Resp 20 | Wt 135.6 lb

## 2017-01-25 DIAGNOSIS — D0511 Intraductal carcinoma in situ of right breast: Secondary | ICD-10-CM

## 2017-01-25 DIAGNOSIS — M810 Age-related osteoporosis without current pathological fracture: Secondary | ICD-10-CM | POA: Diagnosis not present

## 2017-01-25 DIAGNOSIS — Z79899 Other long term (current) drug therapy: Secondary | ICD-10-CM | POA: Diagnosis not present

## 2017-01-25 DIAGNOSIS — Z923 Personal history of irradiation: Secondary | ICD-10-CM | POA: Diagnosis not present

## 2017-01-25 DIAGNOSIS — E785 Hyperlipidemia, unspecified: Secondary | ICD-10-CM | POA: Diagnosis not present

## 2017-01-25 DIAGNOSIS — E559 Vitamin D deficiency, unspecified: Secondary | ICD-10-CM | POA: Diagnosis not present

## 2017-01-25 DIAGNOSIS — Z7901 Long term (current) use of anticoagulants: Secondary | ICD-10-CM

## 2017-01-25 DIAGNOSIS — I482 Chronic atrial fibrillation: Secondary | ICD-10-CM

## 2017-01-25 DIAGNOSIS — Z9012 Acquired absence of left breast and nipple: Secondary | ICD-10-CM

## 2017-01-25 DIAGNOSIS — Z79810 Long term (current) use of selective estrogen receptor modulators (SERMs): Secondary | ICD-10-CM

## 2017-01-25 DIAGNOSIS — Z9882 Breast implant status: Secondary | ICD-10-CM | POA: Diagnosis not present

## 2017-01-25 DIAGNOSIS — I10 Essential (primary) hypertension: Secondary | ICD-10-CM

## 2017-01-25 DIAGNOSIS — H905 Unspecified sensorineural hearing loss: Secondary | ICD-10-CM | POA: Diagnosis not present

## 2017-01-25 DIAGNOSIS — Z87891 Personal history of nicotine dependence: Secondary | ICD-10-CM

## 2017-01-25 MED ORDER — TAMOXIFEN CITRATE 20 MG PO TABS
20.0000 mg | ORAL_TABLET | Freq: Every day | ORAL | 2 refills | Status: DC
Start: 2017-01-25 — End: 2017-11-24

## 2017-01-25 NOTE — Progress Notes (Signed)
Patient denies any concerns today, tolerating Tamoxifen well.   

## 2017-02-05 IMAGING — MG MM BREAST SURGICAL SPECIMEN
1 series · 1 of 1 positions shown · non-contrast
Comparison: Previous exam(s).

CLINICAL DATA: Patient with a right breast DCIS status post
lumpectomy today after needle localization.

EXAM:
SPECIMEN RADIOGRAPH OF THE RIGHT BREAST

[R SPECIMEN]
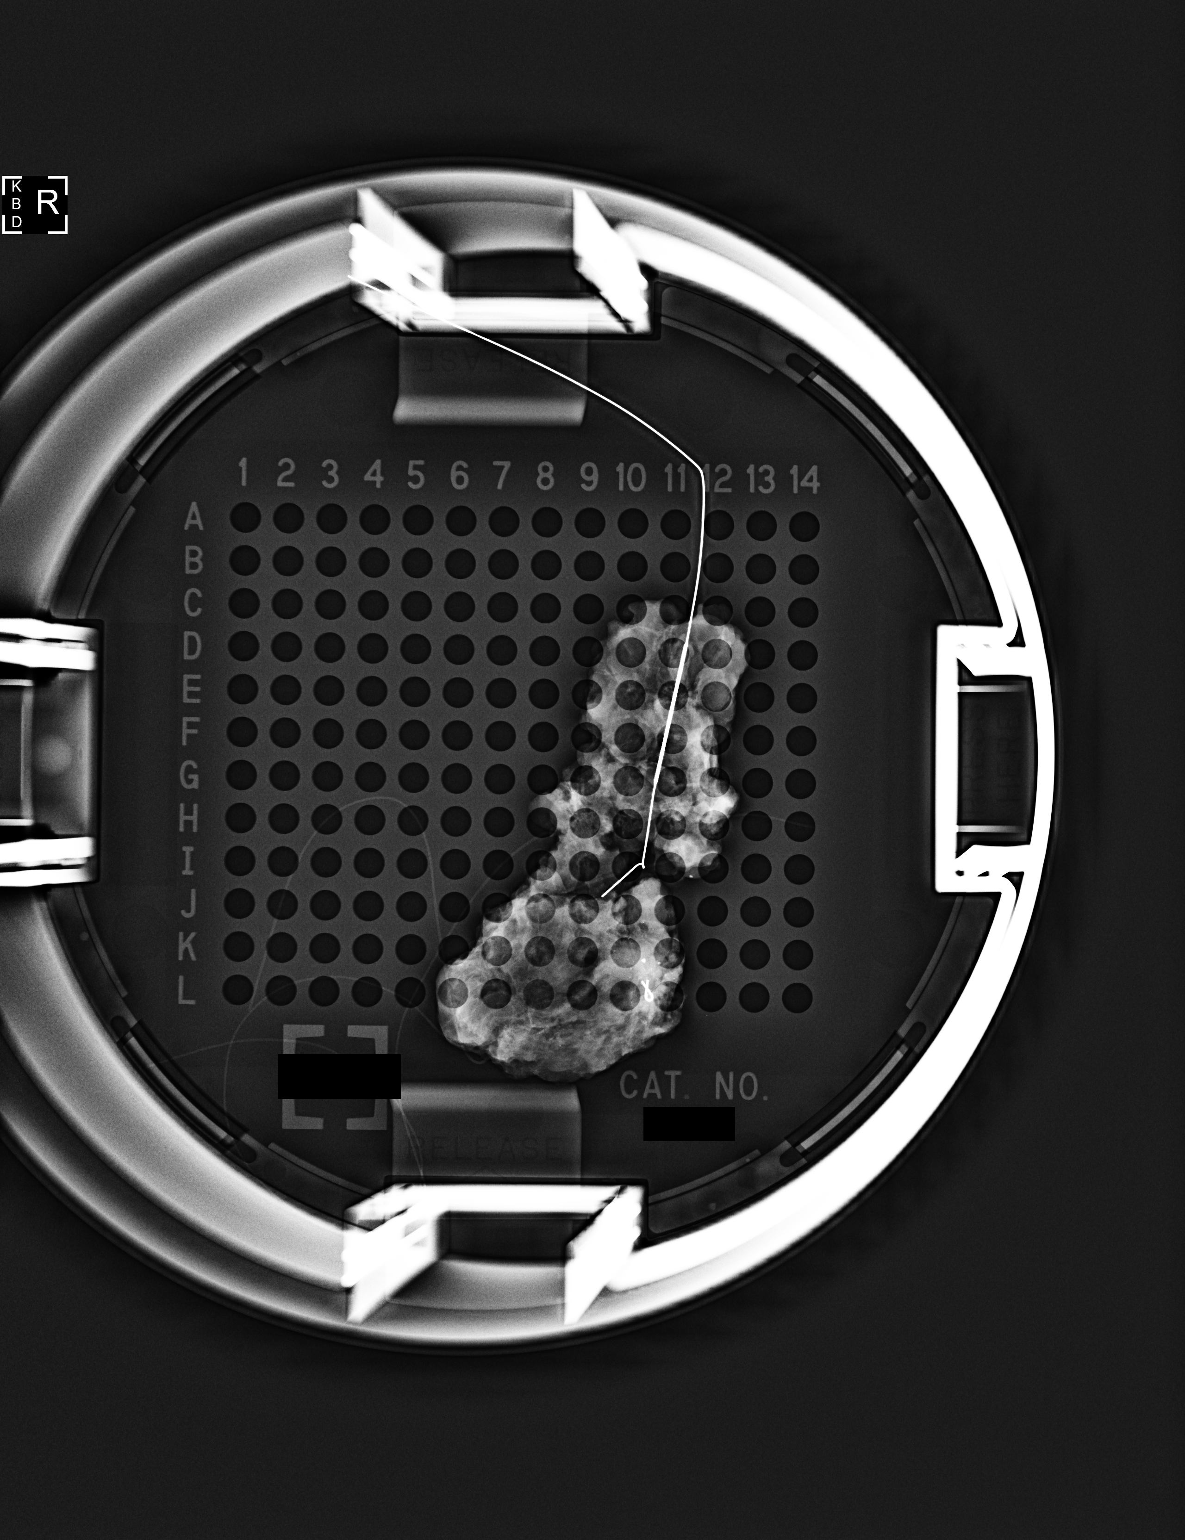

[1 of 1 positions shown; findings below may reference images not displayed]

FINDINGS: Status post excision of the right breast. The wire tip and biopsy
marker clip are present within the specimen. Findings discussed with
the surgeon during the procedure.
IMPRESSION: Specimen radiograph of the right breast.

## 2017-02-05 IMAGING — MG MM DIGITAL DIAGNOSTIC UNILAT*R*
2 series · 2 of 2 positions shown · non-contrast
Comparison: Previous exams.

CLINICAL DATA: Patient with right breast DCIS for lumpectomy today
after needle localization.

EXAM:
NEEDLE LOCALIZATION OF THE RIGHT BREAST WITH ULTRASOUND GUIDANCE

[R ML]
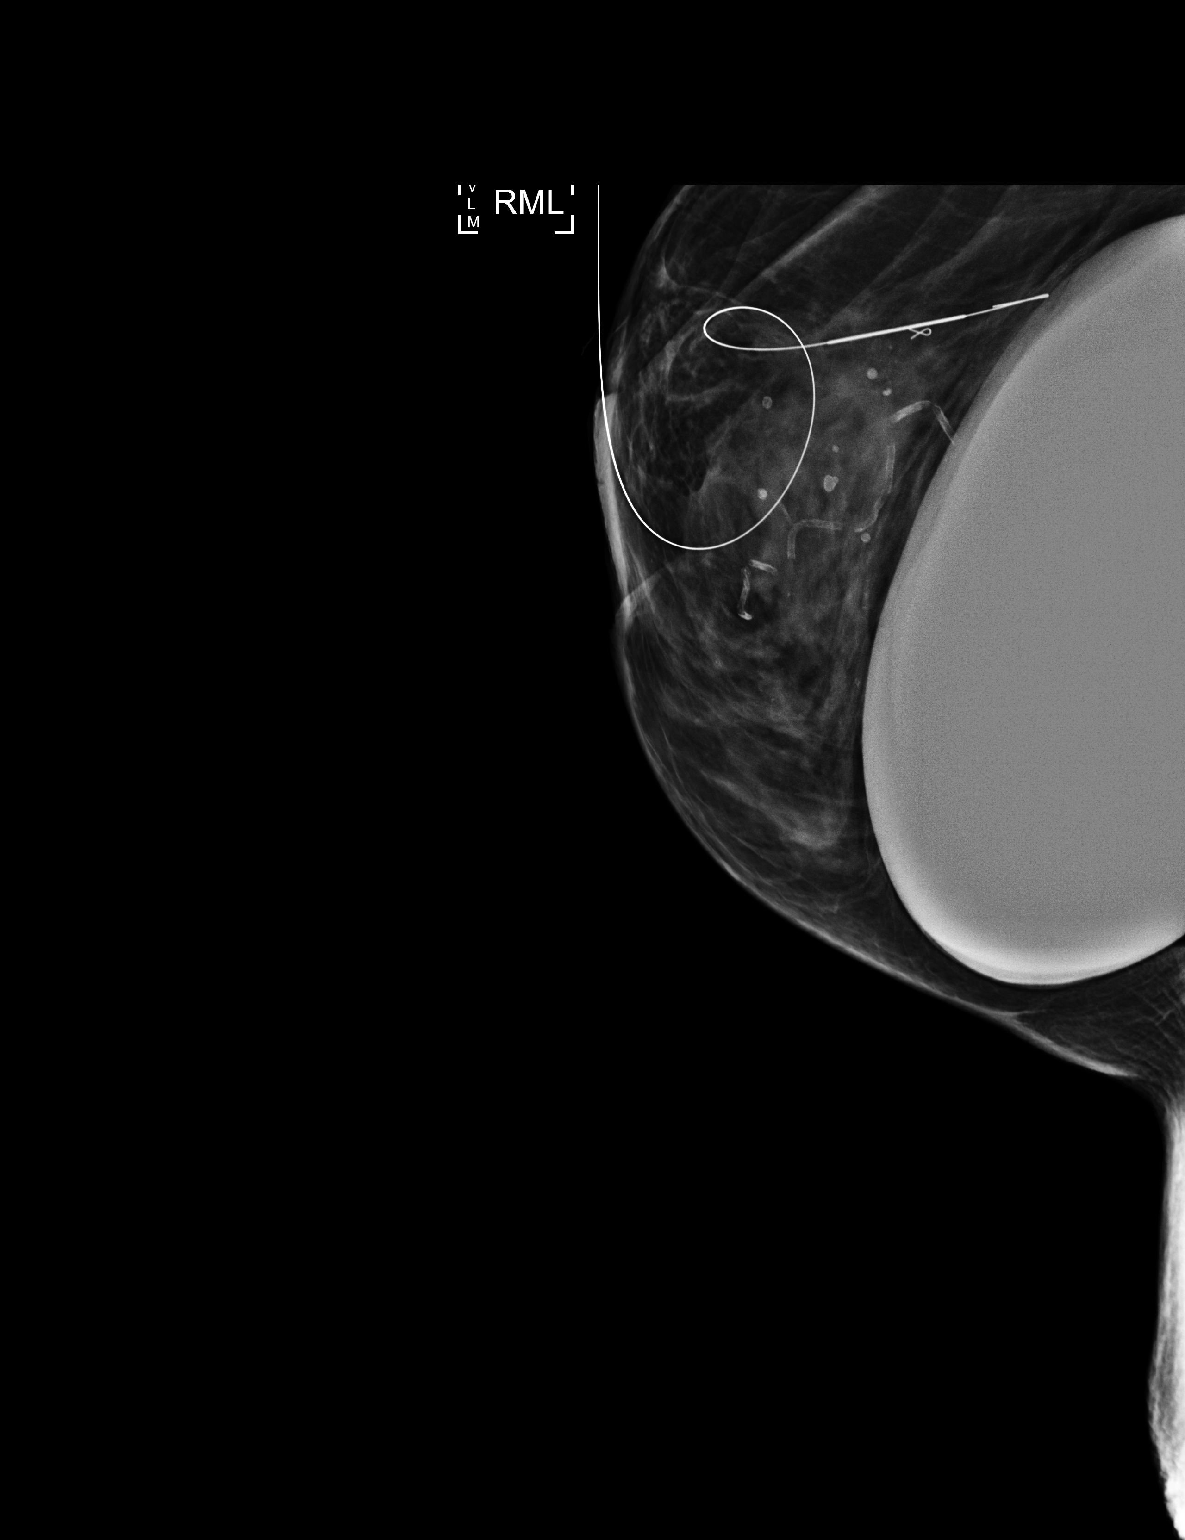

[R CC]
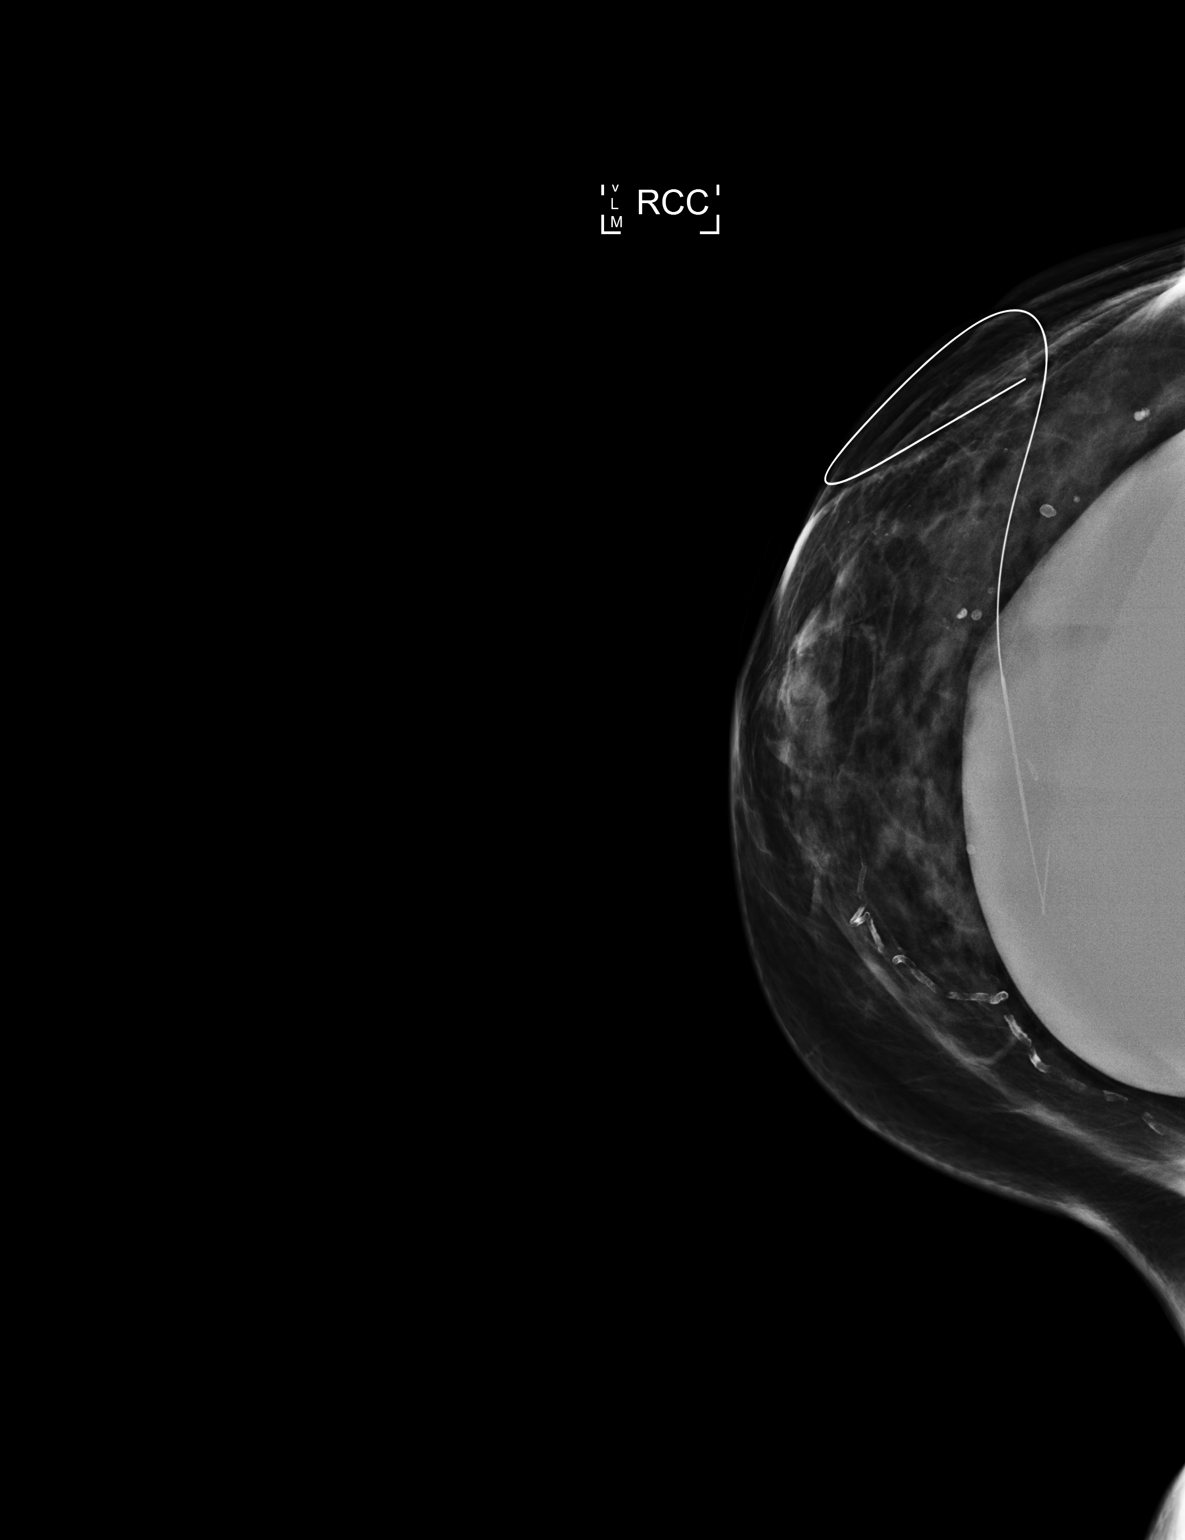

[2 of 2 positions shown; findings below may reference images not displayed]

FINDINGS: Patient presents for needle localization prior to breast
conservation surgery. I met with the patient and we discussed the
procedure of needle localization including benefits and
alternatives. We discussed the high likelihood of a successful
procedure. We discussed the risks of the procedure, including
infection, bleeding, tissue injury, and further surgery. Informed,
written consent was given. The usual time-out protocol was performed
immediately prior to the procedure.

Using ultrasound guidance, sterile technique, 1% lidocaine and a 7
cm modified Kopans needle, the right breast mass at the 12:30
o'clock axis was localized using a lateral approach. The images were
marked for Dr. Telles.

After ultrasound-guided placement of the localization wire, 2D CC
and ML views of the right breast were obtained to confirm
appropriate positioning of the localization wire relative to the
biopsy clip. The wire is well positioned relative to the biopsy
clip.
IMPRESSION: Needle localization of the right breast mass at the 12:30 o'clock
axis. No apparent complications.

## 2017-02-05 IMAGING — MG US NEEDLE LOCALIZATION*L*
1 series · 6 of 6 positions shown · non-contrast
Comparison: Previous exams.

CLINICAL DATA: Patient with right breast DCIS for lumpectomy today
after needle localization.

EXAM:
NEEDLE LOCALIZATION OF THE RIGHT BREAST WITH ULTRASOUND GUIDANCE

[Series 1: MG view · 0.07mm/px · 6 of 6 slices shown]
[im 1/6]
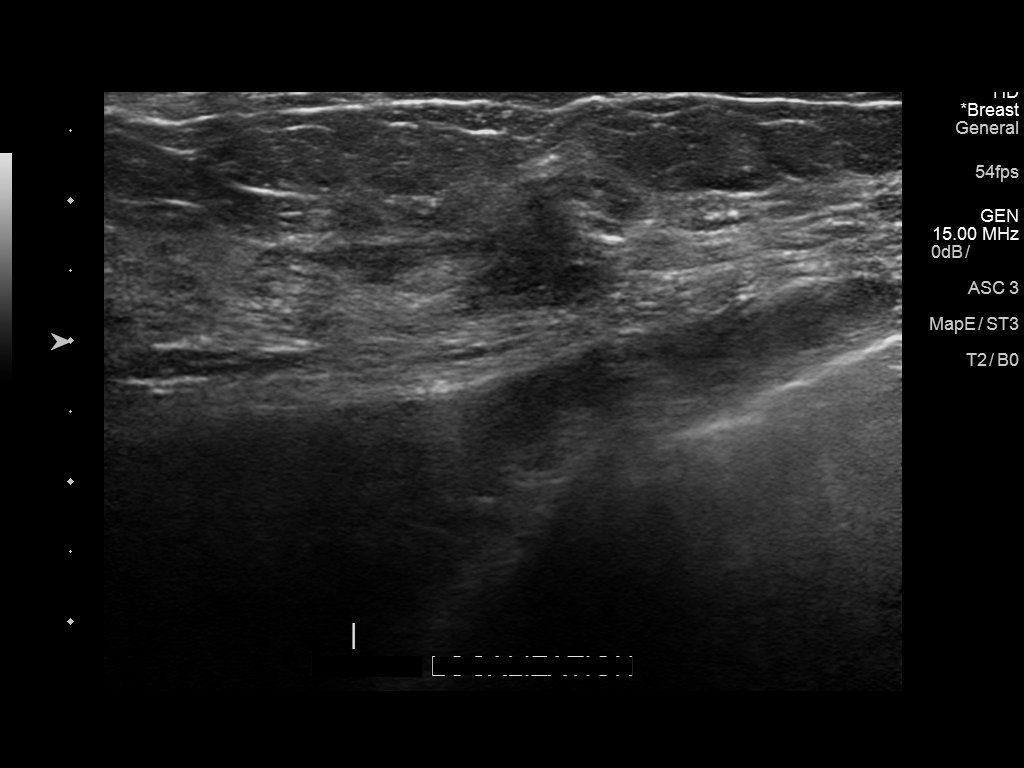
[im 2/6]
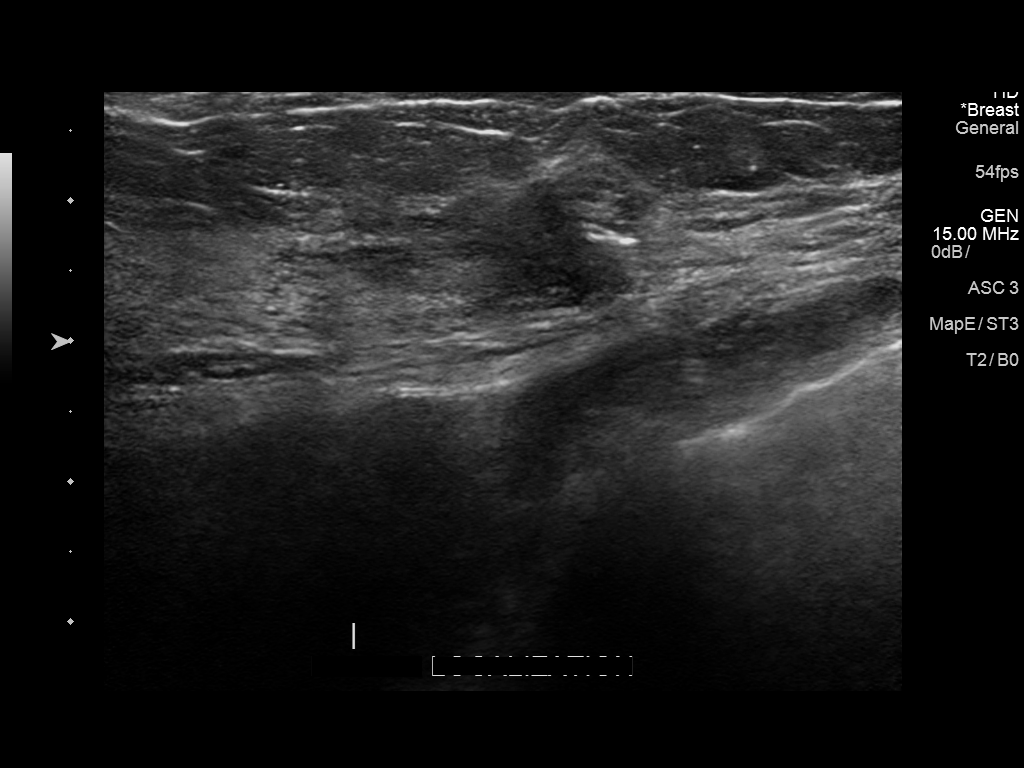
[im 3/6]
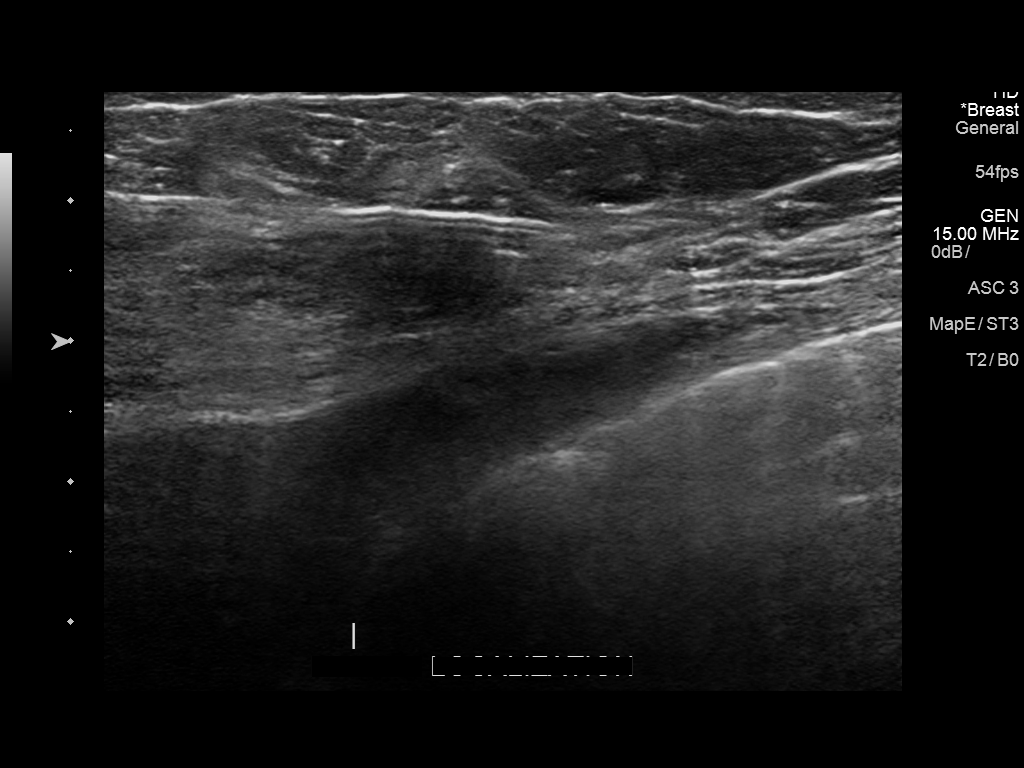
[im 4/6]
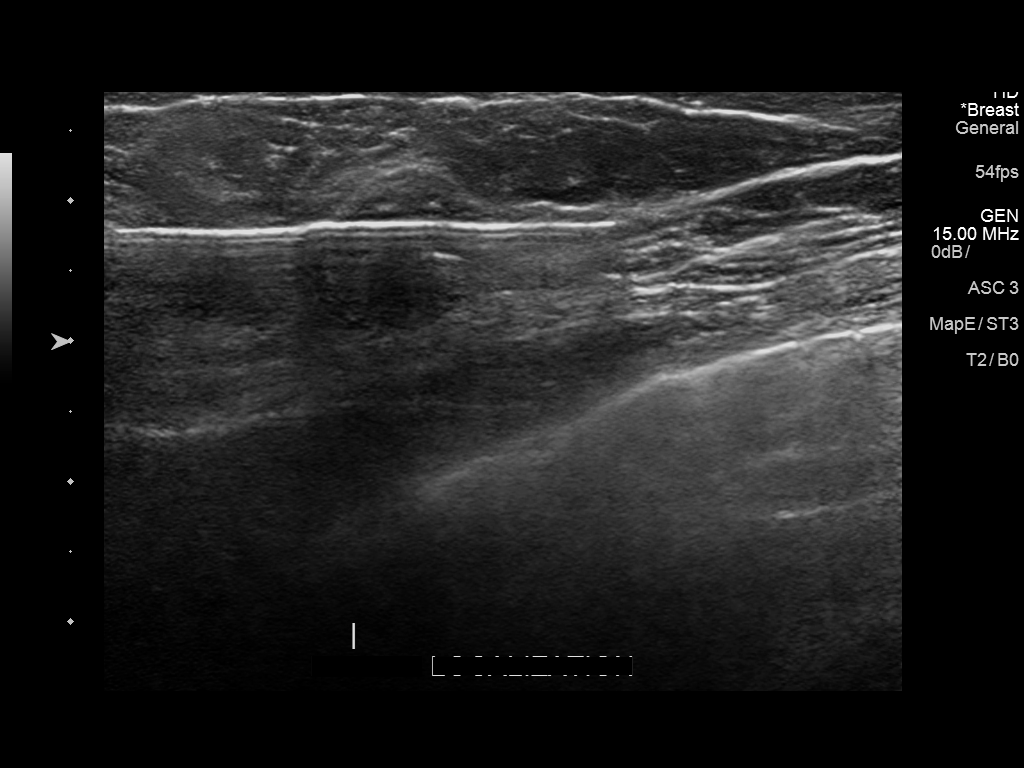
[im 5/6]
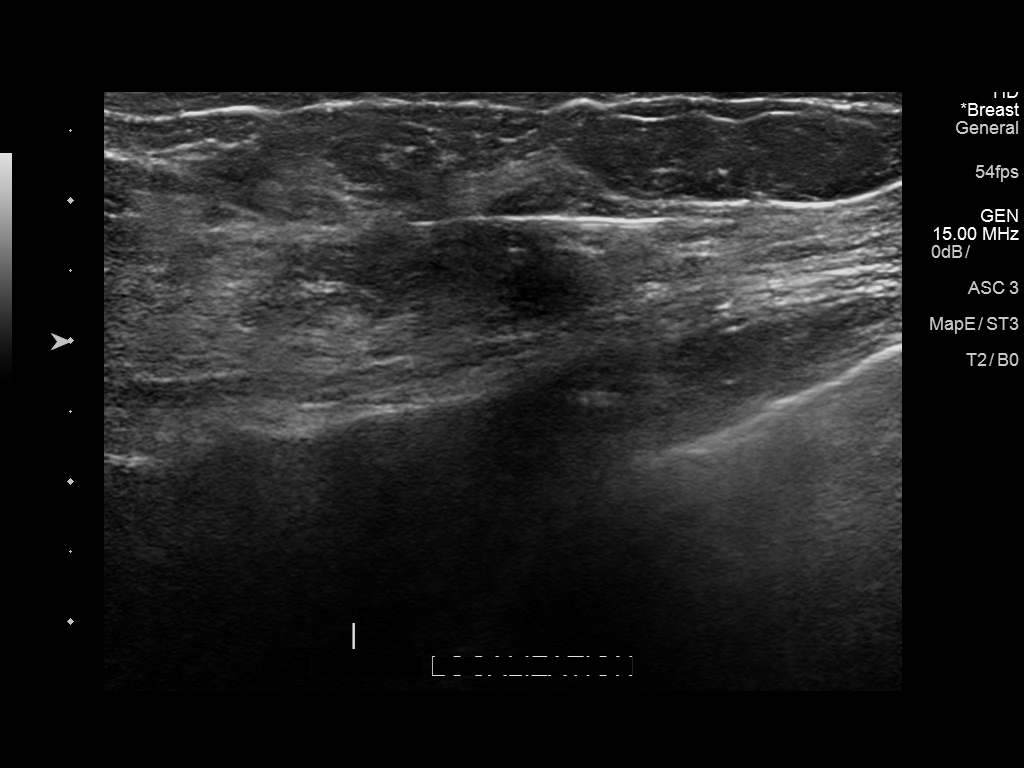
[im 6/6]
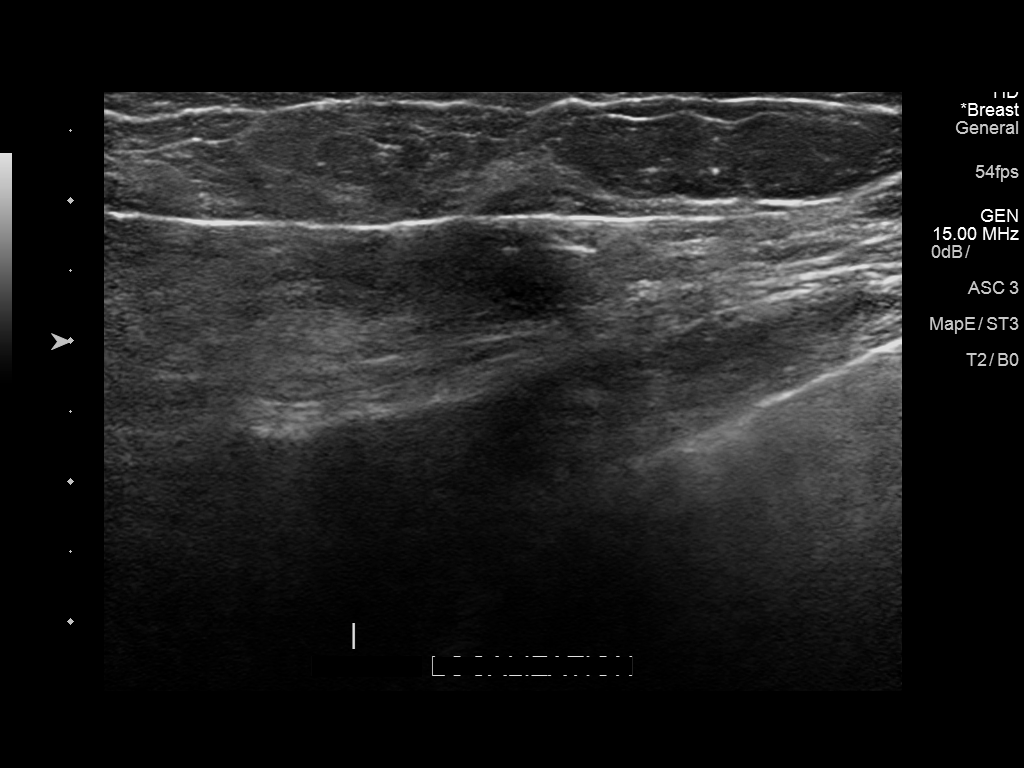

[6 of 6 positions shown; findings below may reference images not displayed]

FINDINGS: Patient presents for needle localization prior to breast
conservation surgery. I met with the patient and we discussed the
procedure of needle localization including benefits and
alternatives. We discussed the high likelihood of a successful
procedure. We discussed the risks of the procedure, including
infection, bleeding, tissue injury, and further surgery. Informed,
written consent was given. The usual time-out protocol was performed
immediately prior to the procedure.

Using ultrasound guidance, sterile technique, 1% lidocaine and a 7
cm modified Kopans needle, the right breast mass at the 12:30
o'clock axis was localized using a lateral approach. The images were
marked for Dr. Telles.

After ultrasound-guided placement of the localization wire, 2D CC
and ML views of the right breast were obtained to confirm
appropriate positioning of the localization wire relative to the
biopsy clip. The wire is well positioned relative to the biopsy
clip.
IMPRESSION: Needle localization of the right breast mass at the 12:30 o'clock
axis. No apparent complications.

## 2017-07-10 DIAGNOSIS — Z79899 Other long term (current) drug therapy: Secondary | ICD-10-CM | POA: Insufficient documentation

## 2017-07-25 ENCOUNTER — Encounter: Payer: Self-pay | Admitting: *Deleted

## 2017-07-26 ENCOUNTER — Encounter
Admission: RE | Disposition: A | Discharge status: Home / Self Care | Payer: Self-pay | Source: Ambulatory Visit | Attending: Gastroenterology

## 2017-07-26 ENCOUNTER — Ambulatory Visit
Admission: RE | Admit: 2017-07-26 | Discharge: 2017-07-26 | Disposition: A | Discharge status: Home / Self Care | Payer: Medicare HMO | Source: Ambulatory Visit | Attending: Gastroenterology | Admitting: Gastroenterology

## 2017-07-26 ENCOUNTER — Ambulatory Visit: Payer: Medicare HMO | Admitting: Anesthesiology

## 2017-07-26 DIAGNOSIS — I10 Essential (primary) hypertension: Secondary | ICD-10-CM | POA: Diagnosis not present

## 2017-07-26 DIAGNOSIS — Z79899 Other long term (current) drug therapy: Secondary | ICD-10-CM | POA: Insufficient documentation

## 2017-07-26 DIAGNOSIS — D123 Benign neoplasm of transverse colon: Secondary | ICD-10-CM | POA: Diagnosis not present

## 2017-07-26 DIAGNOSIS — I482 Chronic atrial fibrillation: Secondary | ICD-10-CM | POA: Diagnosis not present

## 2017-07-26 DIAGNOSIS — Z885 Allergy status to narcotic agent status: Secondary | ICD-10-CM | POA: Insufficient documentation

## 2017-07-26 DIAGNOSIS — K648 Other hemorrhoids: Secondary | ICD-10-CM | POA: Diagnosis not present

## 2017-07-26 DIAGNOSIS — D124 Benign neoplasm of descending colon: Secondary | ICD-10-CM | POA: Diagnosis not present

## 2017-07-26 DIAGNOSIS — Z1211 Encounter for screening for malignant neoplasm of colon: Secondary | ICD-10-CM | POA: Diagnosis present

## 2017-07-26 DIAGNOSIS — E785 Hyperlipidemia, unspecified: Secondary | ICD-10-CM | POA: Diagnosis not present

## 2017-07-26 DIAGNOSIS — Z7901 Long term (current) use of anticoagulants: Secondary | ICD-10-CM | POA: Diagnosis not present

## 2017-07-26 DIAGNOSIS — D125 Benign neoplasm of sigmoid colon: Secondary | ICD-10-CM | POA: Diagnosis not present

## 2017-07-26 DIAGNOSIS — E559 Vitamin D deficiency, unspecified: Secondary | ICD-10-CM | POA: Insufficient documentation

## 2017-07-26 DIAGNOSIS — K5730 Diverticulosis of large intestine without perforation or abscess without bleeding: Secondary | ICD-10-CM | POA: Diagnosis not present

## 2017-07-26 DIAGNOSIS — D120 Benign neoplasm of cecum: Secondary | ICD-10-CM | POA: Insufficient documentation

## 2017-07-26 DIAGNOSIS — H905 Unspecified sensorineural hearing loss: Secondary | ICD-10-CM | POA: Insufficient documentation

## 2017-07-26 HISTORY — PX: COLONOSCOPY WITH PROPOFOL: SHX5780

## 2017-07-26 LAB — PROTIME-INR
INR: 1.17
Prothrombin Time: 14.8 seconds (ref 11.4–15.2)

## 2017-07-26 SURGERY — COLONOSCOPY WITH PROPOFOL
Anesthesia: General

## 2017-07-26 MED ORDER — PROPOFOL 500 MG/50ML IV EMUL
INTRAVENOUS | Status: AC
  Filled 2017-07-26: qty 50

## 2017-07-26 MED ORDER — SODIUM CHLORIDE 0.9 % IV SOLN
INTRAVENOUS | Status: DC
  Administered 2017-07-26: 1000 mL via INTRAVENOUS

## 2017-07-26 MED ORDER — PROPOFOL 10 MG/ML IV BOLUS
INTRAVENOUS | Status: DC | PRN
  Administered 2017-07-26: 30 mg via INTRAVENOUS
  Administered 2017-07-26: 10 mg via INTRAVENOUS
  Administered 2017-07-26: 60 mg via INTRAVENOUS

## 2017-07-26 MED ORDER — SODIUM CHLORIDE 0.9 % IV SOLN: INTRAVENOUS | Status: DC

## 2017-07-26 MED ORDER — PROPOFOL 500 MG/50ML IV EMUL
INTRAVENOUS | Status: DC | PRN
  Administered 2017-07-26: 150 ug/kg/min via INTRAVENOUS

## 2017-07-26 NOTE — Anesthesia Postprocedure Evaluation (Signed)
Anesthesia Post Note  Patient: Sierra [name]  Procedure(s) Performed: COLONOSCOPY WITH PROPOFOL (N/A )  Patient location during evaluation: Endoscopy Anesthesia Type: General Level of consciousness: awake and alert Pain management: pain level controlled Vital Signs Assessment: post-procedure vital signs reviewed and stable Respiratory status: spontaneous breathing and respiratory function stable Cardiovascular status: stable Anesthetic complications: no     Last Vitals:  Vitals:   07/26/17 0840 07/26/17 0850  BP: 96/65 103/67  Pulse: 70 80  Resp: 19 (!) 22  Temp: (!) 36.1 C   SpO2: 100% 100%    Last Pain:  Vitals:   07/26/17 0840  TempSrc: Tympanic                 Lua Feng K

## 2017-07-26 NOTE — Anesthesia Post-op Follow-up Note (Signed)
Anesthesia QCDR form completed.        

## 2017-07-26 NOTE — Anesthesia Preprocedure Evaluation (Signed)
Anesthesia Evaluation  Patient identified by MRN, date of birth, ID band Patient awake    Reviewed: Allergy & Precautions, NPO status , Patient's Chart, lab work & pertinent test results  History of Anesthesia Complications Negative for: history of anesthetic complications  Airway Mallampati: II       Dental  (+) Partial Lower, Upper Dentures   Pulmonary neg sleep apnea, neg COPD, former smoker,           Cardiovascular hypertension, Pt. on medications (-) Past MI and (-) CHF + dysrhythmias Atrial Fibrillation + Valvular Problems/Murmurs MVP      Neuro/Psych neg Seizures    GI/Hepatic Neg liver ROS, neg GERD  ,  Endo/Other  neg diabetes  Renal/GU negative Renal ROS     Musculoskeletal   Abdominal   Peds  Hematology   Anesthesia Other Findings   Reproductive/Obstetrics                             Anesthesia Physical Anesthesia Plan  ASA: III  Anesthesia Plan: General   Post-op Pain Management:    Induction: Intravenous  PONV Risk Score and Plan: 3 and TIVA, Propofol infusion and Ondansetron  Airway Management Planned: Nasal Cannula  Additional Equipment:   Intra-op Plan:   Post-operative Plan:   Informed Consent: I have reviewed the patients History and Physical, chart, labs and discussed the procedure including the risks, benefits and alternatives for the proposed anesthesia with the patient or authorized representative who has indicated his/her understanding and acceptance.     Plan Discussed with:   Anesthesia Plan Comments:         Anesthesia Quick Evaluation

## 2017-07-26 NOTE — Transfer of Care (Signed)
Immediate Anesthesia Transfer of Care Note  Patient: Sierra [name]  Procedure(s) Performed: COLONOSCOPY WITH PROPOFOL (N/A )  Patient Location: PACU  Anesthesia Type:General  Level of Consciousness: sedated  Airway & Oxygen Therapy: Patient Spontanous Breathing and Patient connected to nasal cannula oxygen  Post-op Assessment: Report given to RN and Post -op Vital signs reviewed and stable  Post vital signs: Reviewed and stable  Last Vitals:  Vitals:   07/26/17 0647  BP: 123/73  Pulse: (!) 105  Resp: 16  Temp: (!) 36 C  SpO2: 100%    Last Pain:  Vitals:   07/26/17 0647  TempSrc: Tympanic         Complications: No apparent anesthesia complications

## 2017-07-26 NOTE — H&P (Signed)
Outpatient short stay form Pre-procedure 07/26/2017 7:38 AM Sierra Sails MD  Primary Physician: Dr. Genene Churn  Reason for visit:  Screening colonoscopy  History of present illness:  Patient is a 77 year old [name] presenting today as above. Her last colonoscopy was in 2008. That was normal. She tolerated her prep well. She takes no aspirin products however she does take warfarin. PTT was checked this morning at 1.17.    Current Facility-Administered Medications:  .  0.9 %  sodium chloride infusion, , Intravenous, Continuous, Sierra Sails, MD, Last Rate: 20 mL/hr at 07/26/17 0737, 1,000 mL at 07/26/17 0737 .  0.9 %  sodium chloride infusion, , Intravenous, Continuous, Sierra Sails, MD  Medications Prior to Admission  Medication Sig Dispense Refill Last Dose  . Cholecalciferol (VITAMIN D3) 5000 units TABS Take 1 tablet by mouth daily. For 5 days per week   Past Week at Unknown time  . diltiazem (TAZTIA XT) 120 MG 24 hr capsule Take 1 tablet by mouth 2 (two) times daily.   07/26/2017 at 0530  . fexofenadine (ALLEGRA) 180 MG tablet Take 1 tablet by mouth daily as needed.   Past Week at Unknown time  . Red Yeast Rice Extract 600 MG CAPS Take 1 tablet by mouth daily.   Past Week at Unknown time  . tamoxifen (NOLVADEX) 20 MG tablet Take 1 tablet (20 mg total) by mouth daily. 90 tablet 2 07/25/2017 at Unknown time  . vitamin C (ASCORBIC ACID) 500 MG tablet Take 1 tablet by mouth every other day.   Past Week at Unknown time  . warfarin (COUMADIN) 5 MG tablet Take by mouth daily. 2.5 mg on Monday and Friday, 5 mg all other days   Past Week at Unknown time     Allergies  Allergen Reactions  . Oxycodone Anaphylaxis  . Zoledronic Acid Other (See Comments)    Iritis  . Ibandronic Acid Other (See Comments)    Flu like symptoms  . Other Other (See Comments)    boniva Flu like symptoms  . Alendronate Other (See Comments)    Flu like symptoms  . Honey Bee Venom Swelling   Marked local swelling     Past Medical History:  Diagnosis Date  . Allergy    Seasonal  . Back pain   . Cancer [facility]) 2013   left breast  . Chronic atrial fibrillation ([place])    s/p 3 ablations, multiple cardioversions  . Dysrhythmia 1998   A-fib  . Hyperlipidemia   . Hypertension   . Mitral valve regurgitation   . Osteoporosis   . Personal history of radiation therapy   . Sensorineural hearing loss (SNHL) of both ears    worst on right  . Vitamin D deficiency     Review of systems:      Physical Exam    Heart and lungs: Regular rate and rhythm without rub or gallop, lungs are bilaterally clear.    HEENT: Normocephalic atraumatic eyes are anicteric    Other:     Pertinant exam for procedure: Soft nontender nondistended bowel sounds positive normoactive.    Planned proceedures: Colonoscopy and indicated procedures. I have discussed the risks benefits and complications of procedures to include not limited to bleeding, infection, perforation and the risk of sedation and the patient wishes to proceed.    Sierra Sails, MD Gastroenterology 07/26/2017  7:38 AM

## 2017-07-26 NOTE — Op Note (Signed)
[facility] Gastroenterology Patient Name: Sierra [name] Procedure Date: 07/26/2017 7:42 AM MRN: [id number] Account #: [id number] Date of Birth: [birth year] Admit Type: Outpatient Age: 77 Room: [facility] ENDO ROOM 1 Gender: Female Note Status: Finalized Procedure:            Colonoscopy Indications:          Screening for colorectal malignant neoplasm Providers:            [name], MD Referring MD:         Christena Flake. [name], MD (Referring MD) Medicines:            Monitored Anesthesia Care Complications:        No immediate complications. Procedure:            Pre-Anesthesia Assessment:                       - ASA Grade Assessment: III - A patient with severe                        systemic disease.                       After obtaining informed consent, the colonoscope was                        passed under direct vision. Throughout the procedure,                        the patient's blood pressure, pulse, and oxygen                        saturations were monitored continuously. The                        Colonoscope was introduced through the anus and                        advanced to the the cecum, identified by appendiceal                        orifice and ileocecal valve. The colonoscopy was                        unusually difficult due to a tortuous colon. Successful                        completion of the procedure was aided by changing the                        patient to a supine position and changing the patient                        to a prone position. The quality of the bowel                        preparation was good. Findings:      A 3 mm polyp was found in the distal sigmoid colon. The polyp was       sessile. The polyp was removed with a cold snare. Resection and       retrieval  were complete.      A 5 mm polyp was found in the transverse colon. The polyp was sessile.       The polyp was removed with a cold snare. Resection and  retrieval were       complete.      A 3 mm polyp was found in the hepatic flexure. The polyp was sessile.       The polyp was removed with a cold snare. Resection and retrieval were       complete.      A 4 mm polyp was found in the cecum. The polyp was sessile. The polyp       was removed with a cold snare. Resection and retrieval were complete.      A 4 mm polyp was found in the ileocecal valve. The polyp was sessile.       The polyp was removed with a piecemeal technique using a cold biopsy       forceps. Resection and retrieval were complete.      A 8 mm polyp was found in the hepatic flexure. The polyp was sessile.       The polyp was removed with a cold snare. Resection and retrieval were       complete.      A 4 mm polyp was found in the descending colon. The polyp was sessile.       The polyp was removed with a cold snare. Resection and retrieval were       complete.      A few medium-mouthed diverticula were found in the sigmoid colon,       descending colon and transverse colon.      Non-bleeding internal hemorrhoids were found during anoscopy. The       hemorrhoids were small.      The digital rectal exam was normal. Impression:           - One 3 mm polyp in the distal sigmoid colon, removed                        with a cold snare. Resected and retrieved.                       - One 5 mm polyp in the transverse colon, removed with                        a cold snare. Resected and retrieved.                       - One 3 mm polyp at the hepatic flexure, removed with a                        cold snare. Resected and retrieved.                       - One 4 mm polyp in the cecum, removed with a cold                        snare. Resected and retrieved.                       - One 4 mm polyp at the ileocecal valve, removed  piecemeal using a cold biopsy forceps. Resected and                        retrieved.                       - One 8 mm polyp at the  hepatic flexure, removed with a                        cold snare. Resected and retrieved.                       - One 4 mm polyp in the descending colon, removed with                        a cold snare. Resected and retrieved.                       - Diverticulosis in the sigmoid colon, in the                        descending colon and in the transverse colon.                       - Non-bleeding internal hemorrhoids. Recommendation:       - Discharge patient to home.                       - Await pathology results.                       - May need repeat colonoscopy at a sooner interval                        depending on the histology of the IC valve polyp. [name], MD 07/26/2017 8:45:07 AM This report has been signed electronically. Number of Addenda: 0 Note Initiated On: 07/26/2017 7:42 AM Scope Withdrawal Time: 0 hours 19 minutes 23 seconds  Total Procedure Duration: 0 hours 49 minutes 41 seconds       [facility]

## 2017-07-29 ENCOUNTER — Encounter: Payer: Self-pay | Admitting: Gastroenterology

## 2017-07-29 LAB — SURGICAL PATHOLOGY

## 2017-08-02 ENCOUNTER — Ambulatory Visit: Payer: Medicare HMO | Admitting: Oncology

## 2017-08-18 NOTE — Progress Notes (Signed)
[name]  [phone] Fax:(336) [phone]  ID: Sierra Green OB: 10/10/1940  MR#: [id number]  YSA#:[id number]  Patient Care Team: Ezequiel Kayser, MD as PCP - General (Internal Medicine)  CHIEF COMPLAINT: Right breast DCIS.  INTERVAL HISTORY: Patient returns to clinic today for routine six-month evaluation.  She continues to tolerate tamoxifen well without significant side effects. She currently feels well and is asymptomatic. She has no neurologic complaints. She denies any recent fevers or illnesses. She has a good appetite and denies weight loss. She denies any pain. She has no chest pain or shortness of breath. She denies any nausea, vomiting, constipation, or diarrhea. She has no urinary complaints. Patient offers no specific complaints today.   REVIEW OF SYSTEMS:   Review of Systems  Constitutional: Negative.  Negative for fever, malaise/fatigue and weight loss.  Respiratory: Negative.  Negative for shortness of breath.   Cardiovascular: Negative.  Negative for chest pain.  Gastrointestinal: Negative.  Negative for abdominal pain.  Genitourinary: Negative.   Musculoskeletal: Negative.   Neurological: Negative.  Negative for sensory change and weakness.  Endo/Heme/Allergies: Does not bruise/bleed easily.  Psychiatric/Behavioral: Negative.    As per HPI. Otherwise, a complete review of systems is negative.   PAST MEDICAL HISTORY: Past Medical History:  Diagnosis Date  . Allergy    Seasonal  . Back pain   . Cancer [facility]) 2013   left breast  . Chronic atrial fibrillation ([place])    s/p 3 ablations, multiple cardioversions  . Dysrhythmia 1998   A-fib  . Hyperlipidemia   . Hypertension   . Mitral valve regurgitation   . Osteoporosis   . Personal history of radiation therapy   . Sensorineural hearing loss (SNHL) of both ears    worst on right  . Vitamin D deficiency     PAST SURGICAL HISTORY: Past Surgical History:  Procedure  Laterality Date  . AUGMENTATION MAMMAPLASTY Bilateral 10/2011  . BREAST BIOPSY Right 08/2015   US guided biopsy - DCIS  . BREAST BIOPSY Left 2013   Stereotactic biopsy - [place]  . BREAST LUMPECTOMY Right 2017  . BREAST LUMPECTOMY WITH NEEDLE LOCALIZATION Right 09/27/2015   Procedure: BREAST LUMPECTOMY WITH NEEDLE LOCALIZATION;  Surgeon: Hubbard Robinson, MD;  Location: [facility] ORS;  Service: General;  Laterality: Right;  . CATARACT EXTRACTION Bilateral Right- 05/2014; Left-06/2014  . COLONOSCOPY WITH PROPOFOL N/A 07/26/2017   Procedure: COLONOSCOPY WITH PROPOFOL;  Surgeon: [name], MD;  Location: [facility] ENDOSCOPY;  Service: Endoscopy;  Laterality: N/A;  . heart ablation     1 at [facility] and 2 at [facility]  . MASTECTOMY Left 07/2011   DCIS- [place], [place]  . PROXIMAL INTERPHALANGEAL FUSION (PIP) Right 05/15/2013   2nd, 3rd, and 5th digits  . SENTINEL NODE BIOPSY Right 09/27/2015   Procedure: SENTINEL NODE BIOPSY;  Surgeon: Hubbard Robinson, MD;  Location: [facility] ORS;  Service: General;  Laterality: Right;  . TONSILLECTOMY  as child  . TUBAL LIGATION Bilateral 1976   Laparoscopic    FAMILY HISTORY Family History  Problem Relation Age of Onset  . Breast cancer Neg Hx        ADVANCED DIRECTIVES:    HEALTH MAINTENANCE: Social History   Tobacco Use  . Smoking status: Former Smoker    Last attempt to quit: 07/02/1966    Years since quitting: 51.1  . Smokeless tobacco: Never Used  Substance Use Topics  . Alcohol use: Yes    Alcohol/week: 2.4 -  3.0 oz    Types: 4 - 5 Glasses of wine per week  . Drug use: No     Colonoscopy:  PAP:  Bone density:  Lipid panel:  Allergies  Allergen Reactions  . Oxycodone Anaphylaxis  . Zoledronic Acid Other (See Comments)    Iritis  . Ibandronic Acid Other (See Comments)    Flu like symptoms  . Other Other (See Comments)    boniva Flu like symptoms  . Alendronate Other (See Comments)    Flu like symptoms  . Honey  Bee Venom Swelling    Marked local swelling    Current Outpatient Medications  Medication Sig Dispense Refill  . Cholecalciferol (VITAMIN D3) 5000 units TABS Take 1 tablet by mouth daily. For 5 days per week    . diltiazem (TAZTIA XT) 120 MG 24 hr capsule Take 1 tablet by mouth 2 (two) times daily.    . Red Yeast Rice Extract 600 MG CAPS Take 1 tablet by mouth daily.    . tamoxifen (NOLVADEX) 20 MG tablet Take 1 tablet (20 mg total) by mouth daily. 90 tablet 2  . vitamin C (ASCORBIC ACID) 500 MG tablet Take 1 tablet by mouth every other day.    . warfarin (COUMADIN) 5 MG tablet Take by mouth daily. 2.5 mg on Monday and Friday, 5 mg all other days    . fexofenadine (ALLEGRA) 180 MG tablet Take 1 tablet by mouth daily as needed.     No current facility-administered medications for this visit.     OBJECTIVE: Vitals:   08/23/17 1005  BP: 138/81  Pulse: 99  Resp: 16  Temp: 98.1 F (36.7 C)     Body mass index is 25 kg/m.    ECOG FS:0 - Asymptomatic  General: Well-developed, well-nourished, no acute distress. Eyes: Pink conjunctiva, anicteric sclera. Breasts: Patient requested exam be deferred today. Lungs: Clear to auscultation bilaterally. Heart: Regular rate and rhythm. No rubs, murmurs, or gallops. Abdomen: Soft, nontender, nondistended. No organomegaly noted, normoactive bowel sounds. Musculoskeletal: No edema, cyanosis, or clubbing. Neuro: Alert, answering all questions appropriately. Cranial nerves grossly intact. Skin: No rashes or petechiae noted. Psych: Normal affect.   LAB RESULTS:  Lab Results  Component Value Date   NA 136 09/19/2015   K 4.4 09/19/2015   CL 104 09/19/2015   CO2 26 09/19/2015   GLUCOSE 98 09/19/2015   BUN 18 09/19/2015   CREATININE 0.63 09/19/2015   CALCIUM 9.3 09/19/2015   PROT 7.3 09/19/2015   ALBUMIN 4.4 09/19/2015   AST 25 09/19/2015   ALT 16 09/19/2015   ALKPHOS 67 09/19/2015   BILITOT 0.9 09/19/2015   GFRNONAA >60 09/19/2015    GFRAA >60 09/19/2015    Lab Results  Component Value Date   WBC 4.5 11/21/2015   NEUTROABS 4.9 09/19/2015   HGB 13.8 11/21/2015   HCT 40.4 11/21/2015   MCV 92.1 11/21/2015   PLT 199 11/21/2015     STUDIES: No results found.  ASSESSMENT: Right breast DCIS.  PLAN:    1. 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 Patient had a lumpectomy with focally positive margins, but proceeded with XRT. Patient initially expressed concern of the interaction between tamoxifen and Coumadin, but ultimately has decided to continue both medications and is having her INR checked more frequently by her primary care physician. Her most recent mammogram on January 10, 2017 was reported as BI-RADS  2, repeat in July 2019. Continue tamoxifen for 5 years completing in June 2022. Return to clinic in 6 months for further evaluation. 2. Anticoagulation: Continue Coumadin. Patient states that she cannot take Xarelto or Eliquis. Continue INR checks per primary care. 3. Risk of endometrial cancer: Although small while taking tamoxifen, it is recommended that patient continue with annual or biannual exams at the discretion of her primary care physician.   Approximately 20 minutes spent in discussion of which greater than 50% was consultation.  Patient expressed understanding and was in agreement with this plan. She also understands that She can call clinic at any time with any questions, concerns, or complaints.    Ductal carcinoma in situ (DCIS) of right breast   Staging form: Breast, AJCC 7th Edition     Clinical stage from 10/16/2015: Stage 0 (Tis (DCIS), N0, M0) - Signed by Lloyd Huger, MD on 10/16/2015   Lloyd Huger, MD   08/23/2017 11:19 AM

## 2017-08-23 ENCOUNTER — Encounter: Payer: Self-pay | Admitting: Oncology

## 2017-08-23 ENCOUNTER — Inpatient Hospital Stay: Payer: Medicare HMO | Attending: Oncology | Admitting: Oncology

## 2017-08-23 VITALS — BP 138/81 | HR 99 | Temp 98.1°F | Resp 16 | Wt 136.7 lb

## 2017-08-23 DIAGNOSIS — Z923 Personal history of irradiation: Secondary | ICD-10-CM | POA: Diagnosis not present

## 2017-08-23 DIAGNOSIS — Z79899 Other long term (current) drug therapy: Secondary | ICD-10-CM | POA: Diagnosis not present

## 2017-08-23 DIAGNOSIS — I482 Chronic atrial fibrillation: Secondary | ICD-10-CM | POA: Diagnosis not present

## 2017-08-23 DIAGNOSIS — E785 Hyperlipidemia, unspecified: Secondary | ICD-10-CM | POA: Insufficient documentation

## 2017-08-23 DIAGNOSIS — Z79810 Long term (current) use of selective estrogen receptor modulators (SERMs): Secondary | ICD-10-CM | POA: Diagnosis not present

## 2017-08-23 DIAGNOSIS — I10 Essential (primary) hypertension: Secondary | ICD-10-CM | POA: Insufficient documentation

## 2017-08-23 DIAGNOSIS — H905 Unspecified sensorineural hearing loss: Secondary | ICD-10-CM | POA: Insufficient documentation

## 2017-08-23 DIAGNOSIS — Z86000 Personal history of in-situ neoplasm of breast: Secondary | ICD-10-CM | POA: Diagnosis not present

## 2017-08-23 DIAGNOSIS — Z170 Estrogen receptor positive status [ER+]: Secondary | ICD-10-CM

## 2017-08-23 DIAGNOSIS — Z7901 Long term (current) use of anticoagulants: Secondary | ICD-10-CM

## 2017-08-23 DIAGNOSIS — Z9012 Acquired absence of left breast and nipple: Secondary | ICD-10-CM | POA: Diagnosis not present

## 2017-08-23 DIAGNOSIS — D0511 Intraductal carcinoma in situ of right breast: Secondary | ICD-10-CM | POA: Insufficient documentation

## 2017-08-23 DIAGNOSIS — Z87891 Personal history of nicotine dependence: Secondary | ICD-10-CM | POA: Diagnosis not present

## 2017-11-24 ENCOUNTER — Other Ambulatory Visit: Payer: Self-pay | Admitting: Oncology

## 2017-11-27 ENCOUNTER — Ambulatory Visit: Payer: Medicare HMO | Admitting: Radiation Oncology

## 2017-12-05 ENCOUNTER — Ambulatory Visit
Admission: RE | Admit: 2017-12-05 | Discharge: 2017-12-05 | Disposition: A | Discharge status: Home / Self Care | Payer: Medicare HMO | Source: Ambulatory Visit | Attending: Radiation Oncology | Admitting: Radiation Oncology

## 2017-12-05 DIAGNOSIS — Z170 Estrogen receptor positive status [ER+]: Secondary | ICD-10-CM | POA: Diagnosis not present

## 2017-12-05 DIAGNOSIS — Z923 Personal history of irradiation: Secondary | ICD-10-CM | POA: Insufficient documentation

## 2017-12-05 DIAGNOSIS — Z79810 Long term (current) use of selective estrogen receptor modulators (SERMs): Secondary | ICD-10-CM | POA: Diagnosis not present

## 2017-12-05 DIAGNOSIS — D0511 Intraductal carcinoma in situ of right breast: Secondary | ICD-10-CM | POA: Diagnosis not present

## 2017-12-05 NOTE — Progress Notes (Signed)
Radiation Oncology Follow up Note  Name: Sierra Green   Date:   12/05/2017 MRN:  [id number] DOB: April 20, 1941    This 77 y.o. female presents to the clinic today for to year follow-up status post whole breast radiation to her right breast for ductal carcinoma in situ.  REFERRING PROVIDER: Ezequiel Kayser, MD  HPI: patient is a 77 year old female now out 2 years having completed whole breast radiation to her right breast for ductal carcinoma in situ..she is currently on tamoxifen tolerating that well without side effect. She specifically denies breast tenderness cough or bone pain.last mammogram was back in July which I have reviewed was BI-RADS 2 benign.she specifically denies breast tenderness cough or bone pain.  COMPLICATIONS OF TREATMENT: none  FOLLOW UP COMPLIANCE: keeps appointments   PHYSICAL EXAM:  There were no vitals taken for this visit. Lungs are clear to A&P cardiac examination essentially unremarkable with regular rate and rhythm. No dominant mass or nodularity is noted in either breast in 2 positions examined. Incision is well-healed. No axillary or supraclavicular adenopathy is appreciated. Cosmetic result is excellent. Well-developed well-nourished patient in NAD. HEENT reveals PERLA, EOMI, discs not visualized.  Oral cavity is clear. No oral mucosal lesions are identified. Neck is clear without evidence of cervical or supraclavicular adenopathy. Lungs are clear to A&P. Cardiac examination is essentially unremarkable with regular rate and rhythm without murmur rub or thrill. Abdomen is benign with no organomegaly or masses noted. Motor sensory and DTR levels are equal and symmetric in the upper and lower extremities. Cranial nerves II through XII are grossly intact. Proprioception is intact. No peripheral adenopathy or edema is identified. No motor or sensory levels are noted. Crude visual fields are within normal range.  RADIOLOGY RESULTS: mammograms reviewed and compatible  with the above-stated findings  PLAN: present time patient is doing well with no evidence of disease. I've asked to see her back in 1 year for follow-up. She or he has scheduled follow-up mammograms in July. She continues on tamoxifen without side effect.patient knows to call with any concerns.  I would like to take this opportunity to thank you for allowing me to participate in the care of your patient.Noreene Filbert, MD

## 2018-01-08 DIAGNOSIS — D126 Benign neoplasm of colon, unspecified: Secondary | ICD-10-CM | POA: Insufficient documentation

## 2018-01-13 ENCOUNTER — Ambulatory Visit
Admission: RE | Admit: 2018-01-13 | Discharge: 2018-01-13 | Disposition: A | Discharge status: Home / Self Care | Payer: Medicare HMO | Source: Ambulatory Visit | Attending: Oncology | Admitting: Oncology

## 2018-01-13 DIAGNOSIS — D0511 Intraductal carcinoma in situ of right breast: Secondary | ICD-10-CM

## 2018-02-18 NOTE — Progress Notes (Signed)
[place]  Telephone:([phone] Fax:(336) [phone]  ID: Sierra Green OB: 06/22/1941  MR#: [id number]  AOZ#:[id number]  Patient Care Team: Ezequiel Kayser, MD as PCP - General (Internal Medicine)  CHIEF COMPLAINT: Right breast DCIS.  INTERVAL HISTORY: Patient returns to clinic today for routine six-month evaluation.  She currently feels well and is asymptomatic.  She continues to tolerate tamoxifen well without significant side effects. She has no neurologic complaints. She denies any recent fevers or illnesses. She has a good appetite and denies weight loss. She denies any pain. She has no chest pain or shortness of breath. She denies any nausea, vomiting, constipation, or diarrhea. She has no urinary complaints.  Patient feels at her baseline offers no specific complaints today.  REVIEW OF SYSTEMS:   Review of Systems  Constitutional: Negative.  Negative for fever, malaise/fatigue and weight loss.  Respiratory: Negative.  Negative for cough and shortness of breath.   Cardiovascular: Negative.  Negative for chest pain and leg swelling.  Gastrointestinal: Negative.  Negative for abdominal pain.  Genitourinary: Negative.  Negative for dysuria.  Musculoskeletal: Negative.  Negative for back pain.  Skin: Negative.  Negative for rash.  Neurological: Negative.  Negative for sensory change, focal weakness, weakness and headaches.  Endo/Heme/Allergies: Does not bruise/bleed easily.  Psychiatric/Behavioral: Negative.  The patient is not nervous/anxious.    As per HPI. Otherwise, a complete review of systems is negative.   PAST MEDICAL HISTORY: Past Medical History:  Diagnosis Date  . Allergy    Seasonal  . Back pain   . Cancer [facility]) 2013   left breast  . Chronic atrial fibrillation ([place])    s/p 3 ablations, multiple cardioversions  . Dysrhythmia 1998   A-fib  . Hyperlipidemia   . Hypertension   . Mitral valve regurgitation   . Osteoporosis   . Personal  history of radiation therapy 2017   RIGHT lumpectomy  . Sensorineural hearing loss (SNHL) of both ears    worst on right  . Vitamin D deficiency     PAST SURGICAL HISTORY: Past Surgical History:  Procedure Laterality Date  . AUGMENTATION MAMMAPLASTY Bilateral 10/2011  . BREAST BIOPSY Right 08/2015   US guided biopsy - DCIS  . BREAST BIOPSY Left 2013   [place]ot[place]  . BREAST LUMPECTOMY Right 2017  . BREAST LUMPECTOMY WITH NEEDLE LOCALIZATION Right 09/27/2015   Procedure: BREAST LUMPECTOMY WITH NEEDLE LOCALIZATION;  Surgeon: Hubbard Robinson, MD;  Location: [facility] ORS;  Service: General;  Laterality: Right;  . CATARACT EXTRACTION Bilateral Right- 05/2014; Left-06/2014  . COLONOSCOPY WITH PROPOFOL N/A 07/26/2017   Procedure: COLONOSCOPY WITH PROPOFOL;  Surgeon: [name], MD;  Location: [facility] ENDOSCOPY;  Service: Endoscopy;  Laterality: N/A;  . heart ablation     1 at [facility] and 2 at [facility]  . MASTECTOMY Left 07/2011   DCIS- [place], [place]  . PROXIMAL INTERPHALANGEAL FUSION (PIP) Right 05/15/2013   2nd, 3rd, and 5th digits  . SENTINEL NODE BIOPSY Right 09/27/2015   Procedure: SENTINEL NODE BIOPSY;  Surgeon: Hubbard Robinson, MD;  Location: [facility] ORS;  Service: General;  Laterality: Right;  . TONSILLECTOMY  as child  . TUBAL LIGATION Bilateral 1976   Laparoscopic    FAMILY HISTORY Family History  Problem Relation Age of Onset  . Breast cancer Neg Hx        ADVANCED DIRECTIVES:    HEALTH MAINTENANCE: Social History   Tobacco Use  . Smoking status: Former  Smoker    Last attempt to quit: 07/02/1966    Years since quitting: 51.6  . Smokeless tobacco: Never Used  Substance Use Topics  . Alcohol use: Yes    Alcohol/week: 4.0 - 5.0 standard drinks    Types: 4 - 5 Glasses of wine per week  . Drug use: No     Colonoscopy:  PAP:  Bone density:  Lipid panel:  Allergies  Allergen Reactions  . Oxycodone Anaphylaxis  . Zoledronic  Acid Other (See Comments)    Iritis  . Ibandronic Acid Other (See Comments)    Flu like symptoms  . Other Other (See Comments)    boniva Flu like symptoms  . Alendronate Other (See Comments)    Flu like symptoms  . Honey Bee Venom Swelling    Marked local swelling    Current Outpatient Medications  Medication Sig Dispense Refill  . Cholecalciferol (VITAMIN D3) 5000 units TABS Take 1 tablet by mouth daily. For 5 days per week    . diltiazem (TAZTIA XT) 120 MG 24 hr capsule Take 1 tablet by mouth 2 (two) times daily.    . fexofenadine (ALLEGRA) 180 MG tablet Take 1 tablet by mouth daily as needed.    . Red Yeast Rice Extract 600 MG CAPS Take 1 tablet by mouth daily.    . tamoxifen (NOLVADEX) 20 MG tablet TAKE 1 TABLET BY MOUTH EVERY DAY 90 tablet 2  . vitamin C (ASCORBIC ACID) 500 MG tablet Take 1 tablet by mouth every other day.    . warfarin (COUMADIN) 5 MG tablet Take by mouth daily. 2.5 mg on Monday and Friday, 5 mg all other days     No current facility-administered medications for this visit.     OBJECTIVE: Vitals:   02/21/18 1011  BP: 116/72  Pulse: 96  Resp: 18  Temp: 98.1 F (36.7 C)     Body mass index is 24.88 kg/m.    ECOG FS:0 - Asymptomatic  General: Well-developed, well-nourished, no acute distress. Eyes: Pink conjunctiva, anicteric sclera. HEENT: Normocephalic, moist mucous membranes. Breast: Patient reports a recent normal exam by a different provider. Lungs: Clear to auscultation bilaterally. Heart: Regular rate and rhythm. No rubs, murmurs, or gallops. Abdomen: Soft, nontender, nondistended. No organomegaly noted, normoactive bowel sounds. Musculoskeletal: No edema, cyanosis, or clubbing. Neuro: Alert, answering all questions appropriately. Cranial nerves grossly intact. Skin: No rashes or petechiae noted. Psych: Normal affect.   LAB RESULTS:  Lab Results  Component Value Date   NA 136 09/19/2015   K 4.4 09/19/2015   CL 104 09/19/2015   CO2 26  09/19/2015   GLUCOSE 98 09/19/2015   BUN 18 09/19/2015   CREATININE 0.63 09/19/2015   CALCIUM 9.3 09/19/2015   PROT 7.3 09/19/2015   ALBUMIN 4.4 09/19/2015   AST 25 09/19/2015   ALT 16 09/19/2015   ALKPHOS 67 09/19/2015   BILITOT 0.9 09/19/2015   GFRNONAA >60 09/19/2015   GFRAA >60 09/19/2015    Lab Results  Component Value Date   WBC 4.5 11/21/2015   NEUTROABS 4.9 09/19/2015   HGB 13.8 11/21/2015   HCT 40.4 11/21/2015   MCV 92.1 11/21/2015   PLT 199 11/21/2015     STUDIES: No results found.  ASSESSMENT: Right breast DCIS.  PLAN:    1. 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 Patient had a lumpectomy with focally positive margins, but declined reexcision  and proceeded with adjuvant XRT. Patient initially expressed concern of the interaction between tamoxifen and Coumadin, but ultimately has decided to continue both medications and is having her INR checked more frequently by her primary care physician.  Continue tamoxifen for a total of 5 years completing in June 2022.  Her most recent mammogram on January 13, 2018 was reported as BI-RADS 2.  Repeat in July 2020.  Return to clinic in 6 months for routine evaluation.   2. Anticoagulation: Continue Coumadin. Patient states that she cannot take Xarelto or Eliquis. Continue INR checks per primary care. 3. Risk of endometrial cancer: Although small while taking tamoxifen, it is recommended that patient continue with annual or biannual exams at the discretion of her primary care physician.   I spent a total of 20 minutes face-to-face with the patient of which greater than 50% of the visit was spent in counseling and coordination of care as detailed above.   Patient expressed understanding and was in agreement with this plan. She also understands that She can call clinic at any time with any questions, concerns, or complaints.    Ductal  carcinoma in situ (DCIS) of right breast   Staging form: Breast, AJCC 7th Edition     Clinical stage from 10/16/2015: Stage 0 (Tis (DCIS), N0, M0) - Signed by Lloyd Huger, MD on 10/16/2015   Lloyd Huger, MD   02/21/2018 12:52 PM

## 2018-02-21 ENCOUNTER — Inpatient Hospital Stay: Payer: Medicare HMO | Attending: Oncology | Admitting: Oncology

## 2018-02-21 ENCOUNTER — Encounter: Payer: Self-pay | Admitting: Oncology

## 2018-02-21 VITALS — BP 116/72 | HR 96 | Temp 98.1°F | Resp 18 | Wt 136.0 lb

## 2018-02-21 DIAGNOSIS — Z79899 Other long term (current) drug therapy: Secondary | ICD-10-CM | POA: Diagnosis not present

## 2018-02-21 DIAGNOSIS — I10 Essential (primary) hypertension: Secondary | ICD-10-CM | POA: Diagnosis not present

## 2018-02-21 DIAGNOSIS — I482 Chronic atrial fibrillation: Secondary | ICD-10-CM | POA: Diagnosis not present

## 2018-02-21 DIAGNOSIS — D0511 Intraductal carcinoma in situ of right breast: Secondary | ICD-10-CM

## 2018-02-21 DIAGNOSIS — E785 Hyperlipidemia, unspecified: Secondary | ICD-10-CM | POA: Diagnosis not present

## 2018-02-21 DIAGNOSIS — Z7901 Long term (current) use of anticoagulants: Secondary | ICD-10-CM

## 2018-02-21 DIAGNOSIS — Z87891 Personal history of nicotine dependence: Secondary | ICD-10-CM | POA: Diagnosis not present

## 2018-02-21 DIAGNOSIS — Z79810 Long term (current) use of selective estrogen receptor modulators (SERMs): Secondary | ICD-10-CM | POA: Diagnosis not present

## 2018-02-21 DIAGNOSIS — E559 Vitamin D deficiency, unspecified: Secondary | ICD-10-CM

## 2018-02-21 DIAGNOSIS — M810 Age-related osteoporosis without current pathological fracture: Secondary | ICD-10-CM

## 2018-02-21 NOTE — Progress Notes (Signed)
Patient denies any concerns today.  

## 2018-06-24 DIAGNOSIS — Z85828 Personal history of other malignant neoplasm of skin: Secondary | ICD-10-CM

## 2018-06-24 HISTORY — DX: Personal history of other malignant neoplasm of skin: Z85.828

## 2018-08-08 NOTE — Progress Notes (Deleted)
[place]  [phone] Fax:(336) [phone]  ID: Sierra Green OB: 01-09-1941  MR#: [id number]  HER#:[id number]  Patient Care Team: Ezequiel Kayser, MD as PCP - General (Internal Medicine)  CHIEF COMPLAINT: Right breast DCIS.  INTERVAL HISTORY: Patient returns to clinic today for routine six-month evaluation.  She currently feels well and is asymptomatic.  She continues to tolerate tamoxifen well without significant side effects. She has no neurologic complaints. She denies any recent fevers or illnesses. She has a good appetite and denies weight loss. She denies any pain. She has no chest pain or shortness of breath. She denies any nausea, vomiting, constipation, or diarrhea. She has no urinary complaints.  Patient feels at her baseline offers no specific complaints today.  REVIEW OF SYSTEMS:   Review of Systems  Constitutional: Negative.  Negative for fever, malaise/fatigue and weight loss.  Respiratory: Negative.  Negative for cough and shortness of breath.   Cardiovascular: Negative.  Negative for chest pain and leg swelling.  Gastrointestinal: Negative.  Negative for abdominal pain.  Genitourinary: Negative.  Negative for dysuria.  Musculoskeletal: Negative.  Negative for back pain.  Skin: Negative.  Negative for rash.  Neurological: Negative.  Negative for sensory change, focal weakness, weakness and headaches.  Endo/Heme/Allergies: Does not bruise/bleed easily.  Psychiatric/Behavioral: Negative.  The patient is not nervous/anxious.    As per HPI. Otherwise, a complete review of systems is negative.   PAST MEDICAL HISTORY: Past Medical History:  Diagnosis Date  . Allergy    Seasonal  . Back pain   . Cancer [facility]) 2013   left breast  . Chronic atrial fibrillation ([place])    s/p 3 ablations, multiple cardioversions  . Dysrhythmia 1998   A-fib  . Hyperlipidemia   . Hypertension   . Mitral valve regurgitation   . Osteoporosis   . Personal  history of radiation therapy 2017   RIGHT lumpectomy  . Sensorineural hearing loss (SNHL) of both ears    worst on right  . Vitamin D deficiency     PAST SURGICAL HISTORY: Past Surgical History:  Procedure Laterality Date  . AUGMENTATION MAMMAPLASTY Bilateral 10/2011  . BREAST BIOPSY Right 08/2015   US guided biopsy - DCIS  . BREAST BIOPSY Left 2013   Stereotactic biopsy - [place]  . BREAST LUMPECTOMY Right 2017  . BREAST LUMPECTOMY WITH NEEDLE LOCALIZATION Right 09/27/2015   Procedure: BREAST LUMPECTOMY WITH NEEDLE LOCALIZATION;  Surgeon: Hubbard Robinson, MD;  Location: [facility] ORS;  Service: General;  Laterality: Right;  . CATARACT EXTRACTION Bilateral Right- 05/2014; Left-06/2014  . COLONOSCOPY WITH PROPOFOL N/A 07/26/2017   Procedure: COLONOSCOPY WITH PROPOFOL;  Surgeon: [name], MD;  Location: [facility] ENDOSCOPY;  Service: Endoscopy;  Laterality: N/A;  . heart ablation     1 at [facility] and 2 at [facility]  . MASTECTOMY Left 07/2011   DCIS- [place], [place]  . PROXIMAL INTERPHALANGEAL FUSION (PIP) Right 05/15/2013   2nd, 3rd, and 5th digits  . SENTINEL NODE BIOPSY Right 09/27/2015   Procedure: SENTINEL NODE BIOPSY;  Surgeon: Hubbard Robinson, MD;  Location: [facility] ORS;  Service: General;  Laterality: Right;  . TONSILLECTOMY  as child  . TUBAL LIGATION Bilateral 1976   Laparoscopic    FAMILY HISTORY Family History  Problem Relation Age of Onset  . Breast cancer Neg Hx        ADVANCED DIRECTIVES:    HEALTH MAINTENANCE: Social History   Tobacco Use  . Smoking status: Former  Smoker    Last attempt to quit: 07/02/1966    Years since quitting: 52.1  . Smokeless tobacco: Never Used  Substance Use Topics  . Alcohol use: Yes    Alcohol/week: 4.0 - 5.0 standard drinks    Types: 4 - 5 Glasses of wine per week  . Drug use: No     Colonoscopy:  PAP:  Bone density:  Lipid panel:  Allergies  Allergen Reactions  . Oxycodone Anaphylaxis  . Zoledronic  Acid Other (See Comments)    Iritis  . Ibandronic Acid Other (See Comments)    Flu like symptoms  . Other Other (See Comments)    boniva Flu like symptoms  . Alendronate Other (See Comments)    Flu like symptoms  . Honey Bee Venom Swelling    Marked local swelling    Current Outpatient Medications  Medication Sig Dispense Refill  . Cholecalciferol (VITAMIN D3) 5000 units TABS Take 1 tablet by mouth daily. For 5 days per week    . diltiazem (TAZTIA XT) 120 MG 24 hr capsule Take 1 tablet by mouth 2 (two) times daily.    . fexofenadine (ALLEGRA) 180 MG tablet Take 1 tablet by mouth daily as needed.    . Red Yeast Rice Extract 600 MG CAPS Take 1 tablet by mouth daily.    . tamoxifen (NOLVADEX) 20 MG tablet TAKE 1 TABLET BY MOUTH EVERY DAY 90 tablet 2  . vitamin C (ASCORBIC ACID) 500 MG tablet Take 1 tablet by mouth every other day.    . warfarin (COUMADIN) 5 MG tablet Take by mouth daily. 2.5 mg on Monday and Friday, 5 mg all other days     No current facility-administered medications for this visit.     OBJECTIVE: There were no vitals filed for this visit.   There is no height or weight on file to calculate BMI.    ECOG FS:0 - Asymptomatic  General: Well-developed, well-nourished, no acute distress. Eyes: Pink conjunctiva, anicteric sclera. HEENT: Normocephalic, moist mucous membranes. Breast: Patient reports a recent normal exam by a different provider. Lungs: Clear to auscultation bilaterally. Heart: Regular rate and rhythm. No rubs, murmurs, or gallops. Abdomen: Soft, nontender, nondistended. No organomegaly noted, normoactive bowel sounds. Musculoskeletal: No edema, cyanosis, or clubbing. Neuro: Alert, answering all questions appropriately. Cranial nerves grossly intact. Skin: No rashes or petechiae noted. Psych: Normal affect.   LAB RESULTS:  Lab Results  Component Value Date   NA 136 09/19/2015   K 4.4 09/19/2015   CL 104 09/19/2015   CO2 26 09/19/2015   GLUCOSE 98  09/19/2015   BUN 18 09/19/2015   CREATININE 0.63 09/19/2015   CALCIUM 9.3 09/19/2015   PROT 7.3 09/19/2015   ALBUMIN 4.4 09/19/2015   AST 25 09/19/2015   ALT 16 09/19/2015   ALKPHOS 67 09/19/2015   BILITOT 0.9 09/19/2015   GFRNONAA >60 09/19/2015   GFRAA >60 09/19/2015    Lab Results  Component Value Date   WBC 4.5 11/21/2015   NEUTROABS 4.9 09/19/2015   HGB 13.8 11/21/2015   HCT 40.4 11/21/2015   MCV 92.1 11/21/2015   PLT 199 11/21/2015     STUDIES: No results found.  ASSESSMENT: Right breast DCIS.  PLAN:    1. 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 Patient had a lumpectomy with focally positive margins, but declined reexcision and proceeded with adjuvant XRT. Patient initially expressed concern  of the interaction between tamoxifen and Coumadin, but ultimately has decided to continue both medications and is having her INR checked more frequently by her primary care physician.  Continue tamoxifen for a total of 5 years completing in June 2022.  Her most recent mammogram on January 13, 2018 was reported as BI-RADS 2.  Repeat in July 2020.  Return to clinic in 6 months for routine evaluation.   2. Anticoagulation: Continue Coumadin. Patient states that she cannot take Xarelto or Eliquis. Continue INR checks per primary care. 3. Risk of endometrial cancer: Although small while taking tamoxifen, it is recommended that patient continue with annual or biannual exams at the discretion of her primary care physician.   I spent a total of 20 minutes face-to-face with the patient of which greater than 50% of the visit was spent in counseling and coordination of care as detailed above.   Patient expressed understanding and was in agreement with this plan. She also understands that She can call clinic at any time with any questions, concerns, or complaints.    Ductal carcinoma in situ (DCIS) of  right breast   Staging form: Breast, AJCC 7th Edition     Clinical stage from 10/16/2015: Stage 0 (Tis (DCIS), N0, M0) - Signed by Lloyd Huger, MD on 10/16/2015   Lloyd Huger, MD   08/08/2018 1:40 PM

## 2018-08-15 ENCOUNTER — Ambulatory Visit: Payer: Medicare HMO | Admitting: Oncology

## 2018-08-22 ENCOUNTER — Ambulatory Visit: Payer: Medicare HMO | Admitting: Oncology

## 2018-09-12 ENCOUNTER — Other Ambulatory Visit: Payer: Self-pay | Admitting: Oncology

## 2018-10-02 ENCOUNTER — Ambulatory Visit: Payer: Medicare HMO | Admitting: Oncology

## 2018-11-12 ENCOUNTER — Other Ambulatory Visit: Payer: Self-pay

## 2018-11-12 NOTE — Progress Notes (Signed)
[place]  Telephone:(336) [phone] Fax:(336) [phone]  ID: Sierra Green OB: 09/05/1940  MR#: [id number]  NOM#:[id number]  Patient Care Team: Ezequiel Kayser, MD as PCP - General (Internal Medicine)  I connected with Sierra Green on 11/13/18 at 10:30 AM EDT by video enabled telemedicine visit and verified that I am speaking with the correct person using two identifiers.   I discussed the limitations, risks, security and privacy concerns of performing an evaluation and management service by telemedicine and the availability of in-person appointments. I also discussed with the patient that there may be a patient responsible charge related to this service. The patient expressed understanding and agreed to proceed.   Other persons participating in the visit and their role in the encounter: Patient, MD  Patient's location: Home Provider's location: Clinic   CHIEF COMPLAINT: Right breast DCIS.  INTERVAL HISTORY: Patient agreed to video enabled telemedicine visit for her routine 63-month evaluation.  She continues to tolerate tamoxifen well without significant side effects.  She currently feels well and is asymptomatic.  She has no neurologic complaints. She denies any recent fevers or illnesses. She has a good appetite and denies weight loss.  She denies any chest pain, shortness of breath, cough, or hemoptysis.  She denies any nausea, vomiting, constipation, or diarrhea. She has no urinary complaints.  Patient feels at her baseline offers no specific complaints today.  REVIEW OF SYSTEMS:   Review of Systems  Constitutional: Negative.  Negative for fever, malaise/fatigue and weight loss.  Respiratory: Negative.  Negative for cough and shortness of breath.   Cardiovascular: Negative.  Negative for chest pain and leg swelling.  Gastrointestinal: Negative.  Negative for abdominal pain.  Genitourinary: Negative.  Negative for dysuria.  Musculoskeletal: Negative.   Negative for back pain.  Skin: Negative.  Negative for rash.  Neurological: Negative.  Negative for sensory change, focal weakness, weakness and headaches.  Endo/Heme/Allergies: Does not bruise/bleed easily.  Psychiatric/Behavioral: Negative.  The patient is not nervous/anxious.    As per HPI. Otherwise, a complete review of systems is negative.   PAST MEDICAL HISTORY: Past Medical History:  Diagnosis Date  . Allergy    Seasonal  . Back pain   . Cancer [facility]) 2013   left breast  . Chronic atrial fibrillation ([place])    s/p 3 ablations, multiple cardioversions  . Dysrhythmia 1998   A-fib  . Hyperlipidemia   . Hypertension   . Mitral valve regurgitation   . Osteoporosis   . Personal history of radiation therapy 2017   RIGHT lumpectomy  . Sensorineural hearing loss (SNHL) of both ears    worst on right  . Vitamin D deficiency     PAST SURGICAL HISTORY: Past Surgical History:  Procedure Laterality Date  . AUGMENTATION MAMMAPLASTY Bilateral 10/2011  . BREAST BIOPSY Right 08/2015   US guided biopsy - DCIS  . BREAST BIOPSY Left 2013   [place]  . BREAST LUMPECTOMY Right 2017  . BREAST LUMPECTOMY WITH NEEDLE LOCALIZATION Right 09/27/2015   Procedure: BREAST LUMPECTOMY WITH NEEDLE LOCALIZATION;  Surgeon: Hubbard Robinson, MD;  Location: [facility] ORS;  Service: General;  Laterality: Right;  . CATARACT EXTRACTION Bilateral Right- 05/2014; Left-06/2014  . COLONOSCOPY WITH PROPOFOL N/A 07/26/2017   Procedure: COLONOSCOPY WITH PROPOFOL;  Surgeon: [name], MD;  Location: [facility] ENDOSCOPY;  Service: Endoscopy;  Laterality: N/A;  . heart ablation     1 at [facility] and 2 at [facility]  .  MASTECTOMY Left 07/2011   DCIS- [place], [place]  . PROXIMAL INTERPHALANGEAL FUSION (PIP) Right 05/15/2013   2nd, 3rd, and 5th digits  . SENTINEL NODE BIOPSY Right 09/27/2015   Procedure: SENTINEL NODE BIOPSY;  Surgeon: Hubbard Robinson, MD;  Location: [facility] ORS;   Service: General;  Laterality: Right;  . TONSILLECTOMY  as child  . TUBAL LIGATION Bilateral 1976   Laparoscopic    FAMILY HISTORY Family History  Problem Relation Age of Onset  . Breast cancer Neg Hx        ADVANCED DIRECTIVES:    HEALTH MAINTENANCE: Social History   Tobacco Use  . Smoking status: Former Smoker    Last attempt to quit: 07/02/1966    Years since quitting: 52.4  . Smokeless tobacco: Never Used  Substance Use Topics  . Alcohol use: Yes    Alcohol/week: 4.0 - 5.0 standard drinks    Types: 4 - 5 Glasses of wine per week  . Drug use: No     Colonoscopy:  PAP:  Bone density:  Lipid panel:  Allergies  Allergen Reactions  . Oxycodone Anaphylaxis  . Zoledronic Acid Other (See Comments)    Iritis  . Ibandronic Acid Other (See Comments)    Flu like symptoms  . Other Other (See Comments)    boniva Flu like symptoms  . Alendronate Other (See Comments)    Flu like symptoms  . Honey Bee Venom Swelling    Marked local swelling    Current Outpatient Medications  Medication Sig Dispense Refill  . Cholecalciferol (VITAMIN D3) 5000 units TABS Take 1 tablet by mouth daily. For 5 days per week    . diltiazem (TAZTIA XT) 120 MG 24 hr capsule Take 1 tablet by mouth 2 (two) times daily.    . fexofenadine (ALLEGRA) 180 MG tablet Take 1 tablet by mouth daily as needed.    . Red Yeast Rice Extract 600 MG CAPS Take 1 tablet by mouth daily.    . tamoxifen (NOLVADEX) 20 MG tablet TAKE 1 TABLET BY MOUTH EVERY DAY 90 tablet 0  . vitamin C (ASCORBIC ACID) 500 MG tablet Take 1 tablet by mouth every other day.    . warfarin (COUMADIN) 5 MG tablet Take by mouth daily. 2.5 mg on Monday and Friday, 5 mg all other days     No current facility-administered medications for this visit.     OBJECTIVE: Vitals:   11/13/18 1011  BP: 112/73  SpO2: 98%     Body mass index is 23.78 kg/m.    ECOG FS:0 - Asymptomatic  General: Well-developed, well-nourished, no acute distress.  HEENT: Normocephalic. Neuro: Alert, answering all questions appropriately. Cranial nerves grossly intact. Skin: No rashes or petechiae noted. Psych: Normal affect.  LAB RESULTS:  Lab Results  Component Value Date   NA 136 09/19/2015   K 4.4 09/19/2015   CL 104 09/19/2015   CO2 26 09/19/2015   GLUCOSE 98 09/19/2015   BUN 18 09/19/2015   CREATININE 0.63 09/19/2015   CALCIUM 9.3 09/19/2015   PROT 7.3 09/19/2015   ALBUMIN 4.4 09/19/2015   AST 25 09/19/2015   ALT 16 09/19/2015   ALKPHOS 67 09/19/2015   BILITOT 0.9 09/19/2015   GFRNONAA >60 09/19/2015   GFRAA >60 09/19/2015    Lab Results  Component Value Date   WBC 4.5 11/21/2015   NEUTROABS 4.9 09/19/2015   HGB 13.8 11/21/2015   HCT 40.4 11/21/2015   MCV 92.1 11/21/2015   PLT 199 11/21/2015  STUDIES: No results found.  ASSESSMENT: Right breast DCIS.  PLAN:    1. 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 Patient had a lumpectomy with focally positive margins, but declined reexcision and proceeded with adjuvant XRT. Patient initially expressed concern of the interaction between tamoxifen and Coumadin, but ultimately has decided to continue both medications and is having her INR checked more frequently by her primary care physician.  Continue tamoxifen for a total of 5 years completing in June 2022. Her most recent mammogram on January 13, 2018 was reported as BI-RADS 2.  Repeat in July 2020.  Return to clinic in 6 months for routine evaluation. 2. Anticoagulation: Continue Coumadin.  Patient reports her INR ranges between 1.9 and 2.9.  Patient states that she cannot take Xarelto or Eliquis. Continue INR checks per primary care. 3. Risk of endometrial cancer: Although small while taking tamoxifen, it is recommended that patient continue with annual or biannual exams at the discretion of her primary care physician.  Patient is due  for her annual exam in the next several months.  I provided 15 minutes of face-to-face video visit time during this encounter, and > 50% was spent counseling as documented under my assessment & plan.  Patient expressed understanding and was in agreement with this plan. She also understands that She can call clinic at any time with any questions, concerns, or complaints.    Ductal carcinoma in situ (DCIS) of right breast   Staging form: Breast, AJCC 7th Edition     Clinical stage from 10/16/2015: Stage 0 (Tis (DCIS), N0, M0) - Signed by Lloyd Huger, MD on 10/16/2015   Lloyd Huger, MD   11/13/2018 3:22 PM

## 2018-11-13 ENCOUNTER — Inpatient Hospital Stay: Payer: Medicare HMO | Attending: Oncology | Admitting: Oncology

## 2018-11-13 VITALS — BP 112/73 | Wt 130.0 lb

## 2018-11-13 DIAGNOSIS — D0511 Intraductal carcinoma in situ of right breast: Secondary | ICD-10-CM

## 2018-11-13 NOTE — Progress Notes (Signed)
Patient visit today for follow up regarding DCIS. Patient denies any concerns today.

## 2018-12-06 ENCOUNTER — Other Ambulatory Visit: Payer: Self-pay | Admitting: Oncology

## 2019-01-21 ENCOUNTER — Other Ambulatory Visit: Payer: Self-pay

## 2019-01-22 ENCOUNTER — Encounter: Payer: Self-pay | Admitting: Radiation Oncology

## 2019-01-22 ENCOUNTER — Ambulatory Visit
Admission: RE | Admit: 2019-01-22 | Discharge: 2019-01-22 | Disposition: A | Discharge status: Home / Self Care | Payer: Medicare HMO | Source: Ambulatory Visit | Attending: Radiation Oncology | Admitting: Radiation Oncology

## 2019-01-22 ENCOUNTER — Other Ambulatory Visit: Payer: Self-pay

## 2019-01-22 VITALS — BP 125/75 | HR 81 | Temp 97.6°F | Resp 18 | Wt 133.8 lb

## 2019-01-22 DIAGNOSIS — D0511 Intraductal carcinoma in situ of right breast: Secondary | ICD-10-CM | POA: Insufficient documentation

## 2019-01-22 DIAGNOSIS — Z170 Estrogen receptor positive status [ER+]: Secondary | ICD-10-CM | POA: Insufficient documentation

## 2019-01-22 DIAGNOSIS — Z923 Personal history of irradiation: Secondary | ICD-10-CM | POA: Diagnosis not present

## 2019-01-22 DIAGNOSIS — Z79810 Long term (current) use of selective estrogen receptor modulators (SERMs): Secondary | ICD-10-CM | POA: Insufficient documentation

## 2019-01-22 NOTE — Progress Notes (Signed)
Radiation Oncology Follow up Note  Name: Sierra Green   Date:   01/22/2019 MRN:  [id number] DOB: April 18, 1941    This 78 y.o. 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  REFERRING PROVIDER: Ezequiel Kayser, MD  HPI: Patient is a 78 year old female now about 3 years having completed whole breast radiation to her right breast for ER PR positive ductal carcinoma in situ.  Seen today in routine follow-up she is doing well.  She specifically denies breast tenderness cough or bone pain.  She is currently on tamoxifen tolerating that well without side effect.  Her last mammogram was over approximately a year ago I have reviewed was BI-RADS 2 benign I have reordered bilateral diagnostic mammograms.  COMPLICATIONS OF TREATMENT: none  FOLLOW UP COMPLIANCE: keeps appointments   PHYSICAL EXAM:  BP 125/75   Pulse 81   Temp 97.6 F (36.4 C)   Resp 18   Wt 133 lb 13.1 oz (60.7 kg)   BMI 24.48 kg/m  Patient has had bilateral breast reconstruction.  No dominant mass or nodularity is noted in either breast in 2 positions examined.  No axillary or supraclavicular adenopathy is noted.  Well-developed well-nourished patient in NAD. HEENT reveals PERLA, EOMI, discs not visualized.  Oral cavity is clear. No oral mucosal lesions are identified. Neck is clear without evidence of cervical or supraclavicular adenopathy. Lungs are clear to A&P. Cardiac examination is essentially unremarkable with regular rate and rhythm without murmur rub or thrill. Abdomen is benign with no organomegaly or masses noted. Motor sensory and DTR levels are equal and symmetric in the upper and lower extremities. Cranial nerves II through XII are grossly intact. Proprioception is intact. No peripheral adenopathy or edema is identified. No motor or sensory levels are noted. Crude visual fields are within normal  range.  RADIOLOGY RESULTS: Mammograms are reviewed and diagnostic mammograms ordered which I will review when available.  PLAN: Present time patient continues to do well with no evidence of disease.  I have reordered bilateral diagnostic mammograms.  Patient I have asked to see back in 1 year for follow-up.  She continues on tamoxifen without side effect.  Patient knows to call with any concerns.  I would like to take this opportunity to thank you for allowing me to participate in the care of your patient.Noreene Filbert, MD

## 2019-02-13 ENCOUNTER — Ambulatory Visit
Admission: RE | Admit: 2019-02-13 | Discharge: 2019-02-13 | Disposition: A | Discharge status: Home / Self Care | Payer: Medicare HMO | Source: Ambulatory Visit | Attending: Oncology | Admitting: Oncology

## 2019-02-13 ENCOUNTER — Other Ambulatory Visit: Payer: Self-pay

## 2019-02-13 DIAGNOSIS — D0511 Intraductal carcinoma in situ of right breast: Secondary | ICD-10-CM | POA: Insufficient documentation

## 2019-02-16 ENCOUNTER — Other Ambulatory Visit: Payer: Self-pay | Admitting: Oncology

## 2019-02-16 DIAGNOSIS — R928 Other abnormal and inconclusive findings on diagnostic imaging of breast: Secondary | ICD-10-CM

## 2019-02-16 DIAGNOSIS — R921 Mammographic calcification found on diagnostic imaging of breast: Secondary | ICD-10-CM

## 2019-02-24 ENCOUNTER — Ambulatory Visit
Admission: RE | Admit: 2019-02-24 | Discharge: 2019-02-24 | Disposition: A | Discharge status: Home / Self Care | Payer: Medicare HMO | Source: Ambulatory Visit | Attending: Oncology | Admitting: Oncology

## 2019-02-24 ENCOUNTER — Other Ambulatory Visit: Payer: Self-pay

## 2019-02-24 DIAGNOSIS — R921 Mammographic calcification found on diagnostic imaging of breast: Secondary | ICD-10-CM

## 2019-02-24 DIAGNOSIS — R928 Other abnormal and inconclusive findings on diagnostic imaging of breast: Secondary | ICD-10-CM

## 2019-02-24 HISTORY — PX: BREAST BIOPSY: SHX20

## 2019-02-25 ENCOUNTER — Other Ambulatory Visit: Payer: Self-pay | Admitting: Anatomic Pathology & Clinical Pathology

## 2019-02-25 ENCOUNTER — Encounter: Payer: Self-pay | Admitting: *Deleted

## 2019-02-25 LAB — SURGICAL PATHOLOGY

## 2019-02-25 NOTE — Progress Notes (Signed)
Called patient today to establish navigation services.  Patient has a new diagnosis of DCIS.  Patient has a history of left breast cancer with mastectomy in 2013, and right breast cancer in 2017.  Patient had lumpectomy and radiation therapy at that time.  She is currently being followed by Dr. Grayland Ormond for her previous breast cancer.  Patient would like an experienced surgeon, but does not want to go to [facility] for her surgery.  Her previous surgeon in no longer at [facility].  I have scheduled her to see Dr. Lysle Pearl at [facility] on 03/03/19 @ 3:00 and to see Dr. Grayland Ormond on 03/06/19 @ 10:30.  I will give her educational material at that time.  She is to call with any questions or needs.

## 2019-02-28 NOTE — Progress Notes (Signed)
[place]  [phone] [phone]  ID: Sierra Green OB: [birth year]  MR#: [id number]  [id number]  Patient Care Team: Ezequiel Kayser, MD as PCP - General (Internal Medicine)    CHIEF COMPLAINT: Recurrent right breast DCIS.  INTERVAL HISTORY: Patient returns to clinic as an add-on after having a recent biopsy of her right breast that revealed recurrent DCIS despite taking tamoxifen.  She currently feels well and is asymptomatic.  She has no neurologic complaints. She denies any recent fevers or illnesses. She has a good appetite and denies weight loss.  She denies any chest pain, shortness of breath, cough, or hemoptysis.  She denies any nausea, vomiting, constipation, or diarrhea. She has no urinary complaints.  Patient offers no further specific complaints today.  REVIEW OF SYSTEMS:   Review of Systems  Constitutional: Negative.  Negative for fever, malaise/fatigue and weight loss.  Respiratory: Negative.  Negative for cough and shortness of breath.   Cardiovascular: Negative.  Negative for chest pain and leg swelling.  Gastrointestinal: Negative.  Negative for abdominal pain.  Genitourinary: Negative.  Negative for dysuria.  Musculoskeletal: Negative.  Negative for back pain.  Skin: Negative.  Negative for rash.  Neurological: Negative.  Negative for sensory change, focal weakness, weakness and headaches.  Endo/Heme/Allergies: Does not bruise/bleed easily.  Psychiatric/Behavioral: Negative.  The patient is not nervous/anxious.    As per HPI. Otherwise, a complete review of systems is negative.   PAST MEDICAL HISTORY: Past Medical History:  Diagnosis Date   Allergy    Seasonal   Back pain    Cancer ([place]) 2013   left breast   Chronic atrial fibrillation    s/p 3 ablations, multiple cardioversions   Dysrhythmia 1998   A-fib   Hyperlipidemia    Hypertension    Mitral valve regurgitation    Osteoporosis    Personal  history of radiation therapy 2017   RIGHT lumpectomy   Sensorineural hearing loss (SNHL) of both ears    worst on right   Vitamin D deficiency     PAST SURGICAL HISTORY: Past Surgical History:  Procedure Laterality Date   AUGMENTATION MAMMAPLASTY Bilateral 10/2011   BREAST BIOPSY Right 08/2015   US guided biopsy - DCIS   BREAST BIOPSY Left 2013   Stereotactic biopsy - [place]   BREAST BIOPSY Right 02/24/2019   Alberteen Sam, pending path   BREAST LUMPECTOMY Right 2017   BREAST LUMPECTOMY WITH NEEDLE LOCALIZATION Right 09/27/2015   Procedure: BREAST LUMPECTOMY WITH NEEDLE LOCALIZATION;  Surgeon: Hubbard Robinson, MD;  Location: [facility] ORS;  Service: General;  Laterality: Right;   CATARACT EXTRACTION Bilateral Right- 05/2014; Left-06/2014   COLONOSCOPY WITH PROPOFOL N/A 07/26/2017   Procedure: COLONOSCOPY WITH PROPOFOL;  Surgeon: [name], MD;  Location: [facility] ENDOSCOPY;  Service: Endoscopy;  Laterality: N/A;   heart ablation     1 at [facility] and 2 at [place] Left 07/2011   DCIS- [place], [place]   PROXIMAL INTERPHALANGEAL FUSION (PIP) Right 05/15/2013   2nd, 3rd, and 5th digits   SENTINEL NODE BIOPSY Right 09/27/2015   Procedure: SENTINEL NODE BIOPSY;  Surgeon: Hubbard Robinson, MD;  Location: [facility] ORS;  Service: General;  Laterality: Right;   TONSILLECTOMY  as child   TUBAL LIGATION Bilateral 1976   Laparoscopic    FAMILY HISTORY Family History  Problem Relation Age of Onset   Breast cancer Neg Hx        ADVANCED DIRECTIVES:  HEALTH MAINTENANCE: Social History   Tobacco Use   Smoking status: Former Smoker    Quit date: 07/02/1966    Years since quitting: 52.7   Smokeless tobacco: Never Used  Substance Use Topics   Alcohol use: Yes    Alcohol/week: 4.0 - 5.0 standard drinks    Types: 4 - 5 Glasses of wine per week   Drug use: No     Colonoscopy:  PAP:  Bone density:  Lipid panel:  Allergies    Allergen Reactions   Oxycodone Anaphylaxis   Zoledronic Acid Other (See Comments)    Iritis   Ibandronic Acid Other (See Comments)    Flu like symptoms   Other Other (See Comments)    boniva Flu like symptoms   Alendronate Other (See Comments)    Flu like symptoms   Honey Bee Venom Swelling    Marked local swelling    Current Outpatient Medications  Medication Sig Dispense Refill   Cholecalciferol (VITAMIN D3) 5000 units TABS Take 1 tablet by mouth daily. For 5 days per week     diltiazem (TAZTIA XT) 120 MG 24 hr capsule Take 1 tablet by mouth 2 (two) times daily.     fexofenadine (ALLEGRA) 180 MG tablet Take 1 tablet by mouth daily as needed.     Red Yeast Rice Extract 600 MG CAPS Take 1 tablet by mouth daily.     tamoxifen (NOLVADEX) 20 MG tablet TAKE 1 TABLET BY MOUTH EVERY DAY 90 tablet 3   vitamin C (ASCORBIC ACID) 500 MG tablet Take 1 tablet by mouth every other day.     warfarin (COUMADIN) 5 MG tablet Take by mouth daily. 2.5 mg on Monday and Friday, 5 mg all other days     No current facility-administered medications for this visit.     OBJECTIVE: Vitals:   03/06/19 0953  BP: 132/64  Pulse: (!) 101  Temp: 98 F (36.7 C)     Body mass index is 24.33 kg/m.    ECOG FS:0 - Asymptomatic  General: Well-developed, well-nourished, no acute distress. Eyes: Pink conjunctiva, anicteric sclera. HEENT: Normocephalic, moist mucous membranes, clear oropharnyx. Breast: Patient requested exam be deferred today. Lungs: Clear to auscultation bilaterally. Heart: Regular rate and rhythm. No rubs, murmurs, or gallops. Abdomen: Soft, nontender, nondistended. No organomegaly noted, normoactive bowel sounds. Musculoskeletal: No edema, cyanosis, or clubbing. Neuro: Alert, answering all questions appropriately. Cranial nerves grossly intact. Skin: No rashes or petechiae noted. Psych: Normal affect.  LAB RESULTS:  Lab Results  Component Value Date   NA 136 09/19/2015    K 4.4 09/19/2015   CL 104 09/19/2015   CO2 26 09/19/2015   GLUCOSE 98 09/19/2015   BUN 18 09/19/2015   CREATININE 0.63 09/19/2015   CALCIUM 9.3 09/19/2015   PROT 7.3 09/19/2015   ALBUMIN 4.4 09/19/2015   AST 25 09/19/2015   ALT 16 09/19/2015   ALKPHOS 67 09/19/2015   BILITOT 0.9 09/19/2015   GFRNONAA >60 09/19/2015   GFRAA >60 09/19/2015    Lab Results  Component Value Date   WBC 4.5 11/21/2015   NEUTROABS 4.9 09/19/2015   HGB 13.8 11/21/2015   HCT 40.4 11/21/2015   MCV 92.1 11/21/2015   PLT 199 11/21/2015     STUDIES: Mm Diag Breast W/implant Tomo Uni R  Result Date: 02/13/2019 CLINICAL DATA:  Patient presents for annual diagnostic exam. History of right breast cancer in 2017 status post lumpectomy and radiation. History of left breast cancer in 23 status  post mastectomy. EXAM: 2D DIGITAL DIAGNOSTIC RIGHT MAMMOGRAM WITH IMPLANTS, CAD AND ADJUNCT TOMO The patient has retropectoral implants. Standard and implant displaced views were performed. COMPARISON:  Previous exam(s). ACR Breast Density Category c: The breast tissue is heterogeneously dense, which may obscure small masses. FINDINGS: In the upper slightly medial right breast at approximately 2 o'clock there are pleomorphic grouped calcifications spanning approximately 17 mm. These are best seen on the CC mag view. Stable appearance of the lumpectomy site. There are no other suspicious findings identified in the right breast. Mammographic images were processed with CAD. IMPRESSION: Grouped calcifications at 2 o'clock in the right breast are suspicious. RECOMMENDATION: Stereotactic core needle biopsy is recommended for the calcifications. I have discussed the findings and recommendations with the patient. Results were also provided in writing at the conclusion of the visit. If applicable, a reminder letter will be sent to the patient regarding the next appointment. BI-RADS CATEGORY  4: Suspicious. Electronically Signed   By: Audie Pinto M.D.   On: 02/13/2019 16:06   Mm Clip Placement Right  Result Date: 02/24/2019 CLINICAL DATA:  Status post stereotactic guided core needle biopsy of a 1.7 cm group of calcifications in the upper inner quadrant of the right breast. EXAM: DIAGNOSTIC RIGHT MAMMOGRAM POST STEREOTACTIC BIOPSY COMPARISON:  Previous exam(s). FINDINGS: Mammographic images were obtained following stereotactic guided biopsy of the recently demonstrated 1.7 cm group calcifications in the upper inner quadrant of the right breast. These demonstrate an X shaped biopsy marker clip at the location of the biopsied calcifications. IMPRESSION: Appropriate clip deployment following right breast stereotactic guided core needle biopsy. Final Assessment: Post Procedure Mammograms for Marker Placement Electronically Signed   By: Claudie Revering M.D.   On: 02/24/2019 13:30   Mm Rt Breast Bx W Loc Dev 1st Lesion Image Bx Spec Stereo Guide  Addendum Date: 02/26/2019   ADDENDUM REPORT: 02/26/2019 14:33 ADDENDUM: PATHOLOGY revealed: BREAST, RIGHT UPPER INNER QUADRANT- HIGH-GRADE DUCTAL CARCINOMA IN SITU, SOLID WITH COMEDONECROSIS, MEASURING AT LEAST 7 MM IN GREATEST EXTENT, INVOLVING MULTIPLE TISSUE; CORES, WITH ASSOCIATED MICROCALCIFICATIONS. - NEGATIVE FOR INVASIVE CARCINOMA. Comment: Estrogen receptor testing will be deferred to the final excision, but may be performed on this specimen if desired. Pathology results are CONCORDANT with imaging findings, per Dr. Claudie Revering. Pathology results were discussed with patient via telephone. The patient reported doing well after the biopsy with tenderness at the site. Post biopsy care instructions were reviewed and questions were answered. The patient was encouraged to call [facility] for any additional concerns. Recommendation: Surgical referral. Nurse navigators at [facility] have arranged patient's surgical referral with Dr. Benjamine Sprague for 03/03/2019. Addendum by  Electa Sniff RN on 02/26/2019. Electronically Signed   By: Claudie Revering M.D.   On: 02/26/2019 14:33   Result Date: 02/26/2019 CLINICAL DATA:  1.7 cm group of pleomorphic calcifications in the upper inner quadrant of the right breast at recent mammography. Status post right lumpectomy and radiation therapy for breast cancer in 2017 and previous left mastectomy for breast cancer. EXAM: RIGHT BREAST STEREOTACTIC CORE NEEDLE BIOPSY COMPARISON:  Previous exams. FINDINGS: The patient and I discussed the procedure of stereotactic-guided biopsy including benefits and alternatives. We discussed the high likelihood of a successful procedure. We discussed the risks of the procedure including infection, bleeding, tissue injury, clip migration, and inadequate sampling. Informed written consent was given. The usual time out protocol was performed immediately prior to the procedure. Using sterile technique and 1% Lidocaine  as local anesthetic, under stereotactic guidance, a 9 gauge vacuum assisted device was used to perform core needle biopsy of the recently demonstrated 1.7 cm group of calcifications in the upper inner quadrant of the right breast using a cephalad approach. Specimen radiograph was performed showing multiple calcifications in multiple specimens. Specimens with calcifications are identified for pathology. Lesion quadrant: Upper inner quadrant At the conclusion of the procedure, an X shaped tissue marker clip was deployed into the biopsy cavity. Follow-up 2-view mammogram was performed and dictated separately. IMPRESSION: Stereotactic-guided biopsy of the recently demonstrated 1.7 cm group calcifications in the upper inner quadrant of the right breast. No apparent complications. Electronically Signed: By: Claudie Revering M.D. On: 02/24/2019 13:20    ASSESSMENT: Recurrent right breast DCIS.  PLAN:    1. Right breast DCIS: Patient was diagnosed with DCIS in her left breast is approximately 2013 and underwent  mastectomy, but did not take adjuvant hormonal therapy.  Previously, patient underwent lumpectomy for her right breast DCIS but was noted to have focally positive margins.  She declined reexcision at that time and proceeded with adjuvant XRT.  Given her recurrent disease, she now will require total mastectomy and has had consultation with surgery as well as plastic surgery.  Since there is no invasive component, is unclear the utility of an axillary node dissection although sentinel node biopsy may be warranted.  She has been instructed to discontinue tamoxifen.  There may be some benefit to switching her to letrozole, but will determine this after her surgery.  Return to clinic 1 to 2 weeks postoperatively to discuss her final pathology results and any treatment planning if necessary.   2. Anticoagulation: Continue Coumadin.  Patient states her primary care physician has given her instructions on how to handle her anticoagulation surrounding her surgery.  Patient states that she cannot take Xarelto or Eliquis. Continue INR checks per primary care. 3. Risk of endometrial cancer: Although small while taking tamoxifen, it is recommended that patient continue with annual or biannual exams at the discretion of her primary care physician.  Patient is due for her annual exam in the next several months.  I spent a total of 30 minutes face-to-face with the patient of which greater than 50% of the visit was spent in counseling and coordination of care as detailed above.   Patient expressed understanding and was in agreement with this plan. She also understands that She can call clinic at any time with any questions, concerns, or complaints.    Ductal carcinoma in situ (DCIS) of right breast   Staging form: Breast, AJCC 7th Edition     Clinical stage from 10/16/2015: Stage 0 (Tis (DCIS), N0, M0) - Signed by Lloyd Huger, MD on 10/16/2015   Lloyd Huger, MD   03/06/2019 12:46 PM

## 2019-03-04 DIAGNOSIS — I350 Nonrheumatic aortic (valve) stenosis: Secondary | ICD-10-CM | POA: Insufficient documentation

## 2019-03-05 NOTE — Progress Notes (Signed)
Patient is coming in for appointment. She is doing well. She is aware of the no visitor policy and has answered no to all covid screening questions

## 2019-03-06 ENCOUNTER — Other Ambulatory Visit: Payer: Self-pay

## 2019-03-06 ENCOUNTER — Encounter: Payer: Self-pay | Admitting: Oncology

## 2019-03-06 ENCOUNTER — Inpatient Hospital Stay: Payer: Medicare HMO | Attending: Oncology | Admitting: Oncology

## 2019-03-06 VITALS — BP 132/64 | HR 101 | Temp 98.0°F | Wt 133.0 lb

## 2019-03-06 DIAGNOSIS — Z7901 Long term (current) use of anticoagulants: Secondary | ICD-10-CM | POA: Insufficient documentation

## 2019-03-06 DIAGNOSIS — Z87891 Personal history of nicotine dependence: Secondary | ICD-10-CM | POA: Insufficient documentation

## 2019-03-06 DIAGNOSIS — Z9012 Acquired absence of left breast and nipple: Secondary | ICD-10-CM | POA: Insufficient documentation

## 2019-03-06 DIAGNOSIS — Z79810 Long term (current) use of selective estrogen receptor modulators (SERMs): Secondary | ICD-10-CM | POA: Insufficient documentation

## 2019-03-06 DIAGNOSIS — Z170 Estrogen receptor positive status [ER+]: Secondary | ICD-10-CM | POA: Insufficient documentation

## 2019-03-06 DIAGNOSIS — I10 Essential (primary) hypertension: Secondary | ICD-10-CM | POA: Diagnosis not present

## 2019-03-06 DIAGNOSIS — D0511 Intraductal carcinoma in situ of right breast: Secondary | ICD-10-CM | POA: Insufficient documentation

## 2019-03-06 DIAGNOSIS — I4891 Unspecified atrial fibrillation: Secondary | ICD-10-CM | POA: Diagnosis not present

## 2019-03-06 DIAGNOSIS — Z923 Personal history of irradiation: Secondary | ICD-10-CM | POA: Insufficient documentation

## 2019-03-06 NOTE — Progress Notes (Signed)
Patient was recently diagnosed with DCIS of her right breast on 02/24/2019.

## 2019-03-12 ENCOUNTER — Telehealth: Payer: Self-pay

## 2019-03-12 NOTE — Telephone Encounter (Signed)

## 2019-03-13 ENCOUNTER — Encounter: Payer: Self-pay | Admitting: Plastic Surgery

## 2019-03-13 ENCOUNTER — Other Ambulatory Visit: Payer: Self-pay

## 2019-03-13 ENCOUNTER — Ambulatory Visit: Payer: Medicare HMO | Admitting: Plastic Surgery

## 2019-03-13 VITALS — BP 121/77 | Temp 98.1°F | Resp 16 | Ht 62.0 in | Wt 132.0 lb

## 2019-03-13 DIAGNOSIS — C50911 Malignant neoplasm of unspecified site of right female breast: Secondary | ICD-10-CM | POA: Diagnosis not present

## 2019-03-13 NOTE — Progress Notes (Signed)
Patient ID: Sierra Green, female    DOB: 1941-03-23, 78 y.o.   MRN: [id number]   Chief Complaint  Patient presents with  . Skin Problem    The patient is a 78 year old female here with her husband for a consultation for breast reconstruction.  She was recently diagnosed with right-sided breast cancer.  In 2013 she underwent a left mastectomy with implant-based reconstruction.  She had a nipple areole a reconstruction with graft and tissue rearrangement.  She also had a lumpectomy of the right breast followed by radiation.  With a screening mammogram she was recently diagnosed with breast cancer again.  The patient states it is ductal carcinoma.  She is interested in her options for reconstruction she understands that she is scheduled for a right-sided mastectomy.  She has atrial fibrillation and is on diazepam and Coumadin.  She is 5 feet 2 inches tall and weighs 132 pounds.   Review of Systems  Constitutional: Negative.   HENT: Negative.   Eyes: Negative.   Respiratory: Negative.   Cardiovascular: Negative.   Gastrointestinal: Negative.   Endocrine: Negative.   Genitourinary: Negative.   Musculoskeletal: Negative.   Skin: Negative.   Neurological: Negative.   Hematological: Negative.   Psychiatric/Behavioral: Negative.     Past Medical History:  Diagnosis Date  . Allergy    Seasonal  . Back pain   . Cancer [facility]) 2013   left breast  . Chronic atrial fibrillation    s/p 3 ablations, multiple cardioversions  . Dysrhythmia 1998   A-fib  . Hyperlipidemia   . Hypertension   . Mitral valve regurgitation   . Osteoporosis   . Personal history of radiation therapy 2017   RIGHT lumpectomy  . Sensorineural hearing loss (SNHL) of both ears    worst on right  . Vitamin D deficiency     Past Surgical History:  Procedure Laterality Date  . AUGMENTATION MAMMAPLASTY Bilateral 10/2011  . BREAST BIOPSY Right 08/2015   US guided biopsy - DCIS  . BREAST BIOPSY Left 2013   Stereotactic biopsy - [place]  . BREAST BIOPSY Right 02/24/2019   Alberteen Sam, pending path  . BREAST LUMPECTOMY Right 2017  . BREAST LUMPECTOMY WITH NEEDLE LOCALIZATION Right 09/27/2015   Procedure: BREAST LUMPECTOMY WITH NEEDLE LOCALIZATION;  Surgeon: Hubbard Robinson, MD;  Location: [facility] ORS;  Service: General;  Laterality: Right;  . CATARACT EXTRACTION Bilateral Right- 05/2014; Left-06/2014  . COLONOSCOPY WITH PROPOFOL N/A 07/26/2017   Procedure: COLONOSCOPY WITH PROPOFOL;  Surgeon: [name], MD;  Location: [facility] ENDOSCOPY;  Service: Endoscopy;  Laterality: N/A;  . heart ablation     1 [facility] and 2 at [facility]  . MASTECTOMY Left 07/2011   DCIS- [place], [place]  . PROXIMAL INTERPHALANGEAL FUSION (PIP) Right 05/15/2013   2nd, 3rd, and 5th digits  . SENTINEL NODE BIOPSY Right 09/27/2015   Procedure: SENTINEL NODE BIOPSY;  Surgeon: Hubbard Robinson, MD;  Location: [facility] ORS;  Service: General;  Laterality: Right;  . TONSILLECTOMY  as child  . TUBAL LIGATION Bilateral 1976   Laparoscopic      Current Outpatient Medications:  .  Cholecalciferol (VITAMIN D3) 5000 units TABS, Take 1 tablet by mouth daily. For 5 days per week, Disp: , Rfl:  .  diltiazem (TAZTIA XT) 120 MG 24 hr capsule, Take 1 tablet by mouth 2 (two) times daily., Disp: , Rfl:  .  fexofenadine (ALLEGRA) 180 MG tablet, Take 1 tablet by  mouth daily as needed., Disp: , Rfl:  .  Red Yeast Rice Extract 600 MG CAPS, Take 1 tablet by mouth daily., Disp: , Rfl:  .  tamoxifen (NOLVADEX) 20 MG tablet, TAKE 1 TABLET BY MOUTH EVERY DAY, Disp: 90 tablet, Rfl: 3 .  vitamin C (ASCORBIC ACID) 500 MG tablet, Take 1 tablet by mouth every other day., Disp: , Rfl:  .  warfarin (COUMADIN) 5 MG tablet, Take by mouth daily. 2.5 mg on Monday and Friday, 5 mg all other days, Disp: , Rfl:    Objective:   Vitals:   03/13/19 0912  BP: 121/77  Resp: 16  Temp: 98.1 F (36.7 C)  SpO2: 100%    Physical Exam Vitals  signs and nursing note reviewed.  Constitutional:      Appearance: Normal appearance.  HENT:     Head: Normocephalic and atraumatic.  Cardiovascular:     Rate and Rhythm: Normal rate.  Pulmonary:     Effort: Pulmonary effort is normal. No respiratory distress.  Abdominal:     General: Abdomen is flat. There is no distension.     Tenderness: There is no abdominal tenderness.  Musculoskeletal: Normal range of motion.  Skin:    General: Skin is warm.  Neurological:     General: No focal deficit present.     Mental Status: She is alert and oriented to person, place, and time.  Psychiatric:        Mood and Affect: Mood normal.        Behavior: Behavior normal.        Thought Content: Thought content normal.     Assessment & Plan:  No diagnosis found.  Assessment and Plan:  A long, detailed conversation was had regarding the patient's options for breast reconstruction. Five main points, which are explained to all breast reconstruction patients, were discussed.  1. Breast reconstruction is an optional process.  2. Breast reconstruction is a multi-stage process which involves multiple surgeries spaced several months apart. The entire process can take over one year.  3. The major goal of breast reconstruction is to have the patient look normal in clothing. When naked, there will always be scars.  4. Asymmetries are often present during the reconstruction process. Several operations may be needed, including surgery to the non-cancerous breast, to achieve satisfactory results.  5. No matter the reconstructive method, there are ways that the reconstruction can fail and a secondary reconstructive plan would need to be created.   A general discussion regarding all available methods of breast reconstruction were discussed. The types of reconstructions described included.  1. Tissue expander and implant based reconstruction, both single and multi-stage approaches.  2. Autologous only  reconstructions, including free abdominal-tissue based reconstructions.  3. Combination procedures, particularly latissismus dorsi flaps combined with either expanders or implants.  For each of the reconstruction methods mentioned above, the risks, benefits, alternatives, scarring, and recovery time were discussed in great detail. Specific risks detailed included bleeding, infection, hematoma, seroma, scarring, pain, wound healing complications, flap loss, fat necrosis, capsular contracture, need for implant removal, donor site complications, bulge, hernia, umbilical necrosis, need for urgent reoperation, and need for dressing changes were discussed.   Assessment  Once all reconstruction options were presented, a focused discussion was had regarding the patient's suitability for each of these procedures.  A total of 50 minutes of face-to-face time was spent in this encounter, of which >50% was spent in counseling.  We discussed the options of above.  Due  to her age and comorbidities it is going to be safest for her to not have reconstruction.  She and her husband both agree with this plan.  She was made aware of [facility] which can provide her with implants for her bra.  She is happy with that and a prescription was given.  I will let Dr. Lysle Pearl know of the plan for new reconstruction.  We remain available as needed postoperatively. Pictures were obtained of the patient and placed in the chart with the patient's or guardian's permission.   [place], DO

## 2019-03-17 ENCOUNTER — Other Ambulatory Visit: Payer: Self-pay | Admitting: Surgery

## 2019-03-17 DIAGNOSIS — C50211 Malignant neoplasm of upper-inner quadrant of right female breast: Secondary | ICD-10-CM

## 2019-03-18 ENCOUNTER — Ambulatory Visit: Payer: Self-pay | Admitting: Surgery

## 2019-03-18 ENCOUNTER — Encounter: Payer: Self-pay | Admitting: *Deleted

## 2019-03-18 NOTE — Progress Notes (Signed)
Patient informed of her post surgery follow up appointment with Dr. Grayland Ormond on 10/920.

## 2019-03-18 NOTE — H&P (Signed)
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 female who was referred  for evaluation of above. Change was noted on last screening mammogram. Patient does routinely do self breast exams.  Patient has not noted a change on breast exam.  Patient admits to previous breast biopsy. Patient admits to a personal history of breast cancer.  Left and then Right side.  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  She has also had bilateral reconstruction in the past.   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  Past Surgical History:  has a past surgical history that includes Mastectomy (Left, 2013); Tonsillectomy (78 years old); Laparoscopic tubal ligation (Bilateral, 1976); Fusion Interphalangeal Joint (Right, 05/15/2013); Cataract extraction (Right, 05/2014); Cataract extraction (Left, 06/2014); Cardiac surgery; Colonoscopy; and Colonoscopy (07/26/2017).  Family History: family history includes Back Pain in her brother.  Social History:  reports that she quit smoking about 52 years ago. Her smoking use included cigarettes. She has a 2.50 pack-year smoking history. She has never used smokeless tobacco. She reports current alcohol use of about 7.0 standard drinks of alcohol  per week. She reports that she does not use drugs.  Current Medications: has a current medication list which includes the following prescription(s): ascorbic acid (vitamin c), cholecalciferol (vitamin d3), diltiazem, fexofenadine, melatonin, red yeast rice, tamoxifen, and warfarin.  Allergies:       Allergies as of 03/03/2019 - Reviewed 03/03/2019  Allergen Reaction Noted  . Percocet [oxycodone-acetaminophen] Anaphylaxis 01/11/2015  . Boniva [ibandronate] Other (See Comments) 12/28/2014  . Fosamax [alendronate] Other (See Comments) 12/28/2014  . Percocet [oxycodone-acetaminophen] Other (See Comments) 12/28/2014  . Reclast [zoledronic acid-mannitol-water] Other (See Comments) 12/28/2014  . Venom-honey bee Swelling 12/28/2014    ROS:  A 15 point review of systems was performed and was negative except as noted in HPI   Objective:     BP 104/64   Pulse 82   Ht 157.5 cm (5\' 2" )   Wt 59 kg (130 lb)   SpO2 95%   BMI 23.78 kg/m   Constitutional :  alert, appears stated age, cooperative and no distress  Lymphatics/Throat:  no asymmetry, masses, or scars  Respiratory:  clear to auscultation bilaterally  Cardiovascular:  regular rate and rhythm  Gastrointestinal: soft, non-tender; bowel sounds normal; no masses,  no organomegaly.   Musculoskeletal: Steady gait and movement  Skin: Cool and moist  Psychiatric: Normal affect, non-agitated, not confused  Breast:  Chaperone present for exam.  Right breast status post implants.  No obvious palpable lumps or bumps noted on exam, including the right axilla.  Left breast status post mastectomy with reconstruction.  Incisions noted.  No obvious palpable lumps or bumps, including the left axilla .    LABS:  n/a   RADS: Addendum by Enrique Sack, MD on 02/26/2019  6:09 PM ADDENDUM REPORT: 02/26/2019 14:33  ADDENDUM: PATHOLOGY revealed: BREAST, RIGHT UPPER INNER QUADRANT- HIGH-GRADE DUCTAL CARCINOMA IN SITU, SOLID WITH  COMEDONECROSIS, MEASURING AT LEAST 7 MM  IN GREATEST EXTENT, INVOLVING MULTIPLE TISSUE; CORES, WITH ASSOCIATED MICROCALCIFICATIONS. - NEGATIVE FOR INVASIVE CARCINOMA. Comment: Estrogen receptor testing will be deferred to the final excision, but may be performed on this specimen if desired.  Pathology results are CONCORDANT with imaging findings, per Dr. Claudie Revering.  Pathology results were discussed with patient via telephone. The patient reported doing well after the biopsy with tenderness at the site. Post biopsy care instructions were reviewed and questions were answered. The patient was encouraged to call [facility] for any additional concerns.  Recommendation: Surgical referral. Nurse navigators at [facility] have arranged patient's surgical referral with Dr. Benjamine Sprague for 03/03/2019.  Addendum by Electa Sniff RN on 02/26/2019.   Electronically Signed  By: Claudie Revering M.D.  On: 02/26/2019 14:33  Result Narrative  CLINICAL DATA: 1.7 cm group of pleomorphic calcifications in the upper inner quadrant of the right breast at recent mammography. Status post right lumpectomy and radiation therapy for breast cancer in 2017 and previous left mastectomy for breast cancer.  EXAM: RIGHT BREAST STEREOTACTIC CORE NEEDLE BIOPSY  COMPARISON: Previous exams.  FINDINGS: The patient and I discussed the procedure of stereotactic-guided biopsy including benefits and alternatives. We discussed the high likelihood of a successful procedure. We discussed the risks of the procedure including infection, bleeding, tissue injury, clip migration, and inadequate sampling. Informed written consent was given. The usual time out protocol was performed immediately prior to the procedure.  Using sterile technique and 1% Lidocaine as local anesthetic, under stereotactic guidance, a 9 gauge vacuum assisted device was used to perform core needle biopsy of the  recently demonstrated 1.7 cm group of calcifications in the upper inner quadrant of the right breast using a cephalad approach. Specimen radiograph was performed showing multiple calcifications in multiple specimens. Specimens with calcifications are identified for pathology.  Lesion quadrant: Upper inner quadrant  At the conclusion of the procedure, an X shaped tissue marker clip was deployed into the biopsy cavity. Follow-up 2-view mammogram was performed and dictated separately.  IMPRESSION: Stereotactic-guided biopsy of the recently demonstrated 1.7 cm group calcifications in the upper inner quadrant of the right breast. No apparent complications.  Electronically Signed: By: Claudie Revering M.D. On: 02/24/2019 13:20     Assessment:   Malignant neoplasm of upper-inner quadrant of right female breast, unspecified estrogen receptor status (CMS-HCC) [C50.211]  Chronic A. fib on Coumadin   Plan:     1. Malignant neoplasm of upper-inner quadrant of right female breast, unspecified estrogen receptor status (CMS-HCC) [C50.211]  In preparation for the upcoming likely right mastectomy plus or minus axillary dissection for lymph node status, will see Dr. Marla Roe with plastics for reconstruction options.  We will also get in touch with Dr. Alveria Apley office to decide what to do with anticoagulation during the time of your surgery.  We will follow-up with Dr. Grayland Ormond and rest of the oncology team to come up with the group consensus to confirm best treatment.  Per his visit note, SLNB maybe warranted, not sure about full axillary dissection since this is still technically DCIS.  Not sure if mapping will be possible due to previous lumpectomy...  Patient and husband both verbalized understanding.  Currently wishes to pursue mastectomy with reconstruction to maintain symmetrical cosmesis  Called patient to update her of the tumor board recommendations of proceeding with a  right mastectomy with attempted sentinel lymph node biopsy.  I explained to her that if the sentinel lymph node biopsy is unable to be performed,  we will not pursue an axillary dissection at this time and wait for the final pathology results come back since this is still technically a DCIS diagnosis.  Also did discuss the expected recovery time including the overnight observation and drain placement.  Patient verbalized understanding and no further questions at this time.  Scheduled for October 1.  Per discussion with Dr. Elisabeth Cara there are no options for reconstruction at this time.  Discussed the risk of surgery including recurrence, chronic pain, post-op infxn, poor/delayed wound healing, poor cosmesis, seroma, hematoma formation, and possible re-operation to address said risks. The risks of general anesthetic, if used, includes MI, CVA, sudden death or even reaction to anesthetic medications also discussed.  Typical post-op recovery time and possbility of activity restrictions were also discussed.  Alternatives include continued observation.  Benefits include possible symptom relief, pathologic evaluation, and/or curative excision.   The patient verbalized understanding and all questions were answered to the patient's satisfaction.

## 2019-03-18 NOTE — H&P (View-Only) (Signed)
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 female who was referred  for evaluation of above. Change was noted on last screening mammogram. Patient does routinely do self breast exams.  Patient has not noted a change on breast exam.  Patient admits to previous breast biopsy. Patient admits to a personal history of breast cancer.  Left and then Right side.  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  She has also had bilateral reconstruction in the past.   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  Past Surgical History:  has a past surgical history that includes Mastectomy (Left, 2013); Tonsillectomy (78 years old); Laparoscopic tubal ligation (Bilateral, 1976); Fusion Interphalangeal Joint (Right, 05/15/2013); Cataract extraction (Right, 05/2014); Cataract extraction (Left, 06/2014); Cardiac surgery; Colonoscopy; and Colonoscopy (07/26/2017).  Family History: family history includes Back Pain in her brother.  Social History:  reports that she quit smoking about 52 years ago. Her smoking use included cigarettes. She has a 2.50 pack-year smoking history. She has never used smokeless tobacco. She reports current alcohol use of about 7.0 standard drinks of alcohol  per week. She reports that she does not use drugs.  Current Medications: has a current medication list which includes the following prescription(s): ascorbic acid (vitamin c), cholecalciferol (vitamin d3), diltiazem, fexofenadine, melatonin, red yeast rice, tamoxifen, and warfarin.  Allergies:       Allergies as of 03/03/2019 - Reviewed 03/03/2019  Allergen Reaction Noted  . Percocet [oxycodone-acetaminophen] Anaphylaxis 01/11/2015  . Boniva [ibandronate] Other (See Comments) 12/28/2014  . Fosamax [alendronate] Other (See Comments) 12/28/2014  . Percocet [oxycodone-acetaminophen] Other (See Comments) 12/28/2014  . Reclast [zoledronic acid-mannitol-water] Other (See Comments) 12/28/2014  . Venom-honey bee Swelling 12/28/2014    ROS:  A 15 point review of systems was performed and was negative except as noted in HPI   Objective:     BP 104/64   Pulse 82   Ht 157.5 cm (5\' 2" )   Wt 59 kg (130 lb)   SpO2 95%   BMI 23.78 kg/m   Constitutional :  alert, appears stated age, cooperative and no distress  Lymphatics/Throat:  no asymmetry, masses, or scars  Respiratory:  clear to auscultation bilaterally  Cardiovascular:  regular rate and rhythm  Gastrointestinal: soft, non-tender; bowel sounds normal; no masses,  no organomegaly.   Musculoskeletal: Steady gait and movement  Skin: Cool and moist  Psychiatric: Normal affect, non-agitated, not confused  Breast:  Chaperone present for exam.  Right breast status post implants.  No obvious palpable lumps or bumps noted on exam, including the right axilla.  Left breast status post mastectomy with reconstruction.  Incisions noted.  No obvious palpable lumps or bumps, including the left axilla .    LABS:  n/a   RADS: Addendum by Enrique Sack, MD on 02/26/2019  6:09 PM ADDENDUM REPORT: 02/26/2019 14:33  ADDENDUM: PATHOLOGY revealed: BREAST, RIGHT UPPER INNER QUADRANT- HIGH-GRADE DUCTAL CARCINOMA IN SITU, SOLID WITH  COMEDONECROSIS, MEASURING AT LEAST 7 MM  IN GREATEST EXTENT, INVOLVING MULTIPLE TISSUE; CORES, WITH ASSOCIATED MICROCALCIFICATIONS. - NEGATIVE FOR INVASIVE CARCINOMA. Comment: Estrogen receptor testing will be deferred to the final excision, but may be performed on this specimen if desired.  Pathology results are CONCORDANT with imaging findings, per Dr. Claudie Revering.  Pathology results were discussed with patient via telephone. The patient reported doing well after the biopsy with tenderness at the site. Post biopsy care instructions were reviewed and questions were answered. The patient was encouraged to call [facility] for any additional concerns.  Recommendation: Surgical referral. Nurse navigators at [facility] have arranged patient's surgical referral with Dr. Benjamine Sprague for 03/03/2019.  Addendum by Electa Sniff RN on 02/26/2019.   Electronically Signed  By: Claudie Revering M.D.  On: 02/26/2019 14:33  Result Narrative  CLINICAL DATA: 1.7 cm group of pleomorphic calcifications in the upper inner quadrant of the right breast at recent mammography. Status post right lumpectomy and radiation therapy for breast cancer in 2017 and previous left mastectomy for breast cancer.  EXAM: RIGHT BREAST STEREOTACTIC CORE NEEDLE BIOPSY  COMPARISON: Previous exams.  FINDINGS: The patient and I discussed the procedure of stereotactic-guided biopsy including benefits and alternatives. We discussed the high likelihood of a successful procedure. We discussed the risks of the procedure including infection, bleeding, tissue injury, clip migration, and inadequate sampling. Informed written consent was given. The usual time out protocol was performed immediately prior to the procedure.  Using sterile technique and 1% Lidocaine as local anesthetic, under stereotactic guidance, a 9 gauge vacuum assisted device was used to perform core needle biopsy of the  recently demonstrated 1.7 cm group of calcifications in the upper inner quadrant of the right breast using a cephalad approach. Specimen radiograph was performed showing multiple calcifications in multiple specimens. Specimens with calcifications are identified for pathology.  Lesion quadrant: Upper inner quadrant  At the conclusion of the procedure, an X shaped tissue marker clip was deployed into the biopsy cavity. Follow-up 2-view mammogram was performed and dictated separately.  IMPRESSION: Stereotactic-guided biopsy of the recently demonstrated 1.7 cm group calcifications in the upper inner quadrant of the right breast. No apparent complications.  Electronically Signed: By: Claudie Revering M.D. On: 02/24/2019 13:20     Assessment:   Malignant neoplasm of upper-inner quadrant of right female breast, unspecified estrogen receptor status (CMS-HCC) [C50.211]  Chronic A. fib on Coumadin   Plan:     1. Malignant neoplasm of upper-inner quadrant of right female breast, unspecified estrogen receptor status (CMS-HCC) [C50.211]  In preparation for the upcoming likely right mastectomy plus or minus axillary dissection for lymph node status, will see Dr. Marla Roe with plastics for reconstruction options.  We will also get in touch with Dr. Alveria Apley office to decide what to do with anticoagulation during the time of your surgery.  We will follow-up with Dr. Grayland Ormond and rest of the oncology team to come up with the group consensus to confirm best treatment.  Per his visit note, SLNB maybe warranted, not sure about full axillary dissection since this is still technically DCIS.  Not sure if mapping will be possible due to previous lumpectomy...  Patient and husband both verbalized understanding.  Currently wishes to pursue mastectomy with reconstruction to maintain symmetrical cosmesis  Called patient to update her of the tumor board recommendations of proceeding with a  right mastectomy with attempted sentinel lymph node biopsy.  I explained to her that if the sentinel lymph node biopsy is unable to be performed,  we will not pursue an axillary dissection at this time and wait for the final pathology results come back since this is still technically a DCIS diagnosis.  Also did discuss the expected recovery time including the overnight observation and drain placement.  Patient verbalized understanding and no further questions at this time.  Scheduled for October 1.  Per discussion with Dr. Elisabeth Cara there are no options for reconstruction at this time.  Discussed the risk of surgery including recurrence, chronic pain, post-op infxn, poor/delayed wound healing, poor cosmesis, seroma, hematoma formation, and possible re-operation to address said risks. The risks of general anesthetic, if used, includes MI, CVA, sudden death or even reaction to anesthetic medications also discussed.  Typical post-op recovery time and possbility of activity restrictions were also discussed.  Alternatives include continued observation.  Benefits include possible symptom relief, pathologic evaluation, and/or curative excision.   The patient verbalized understanding and all questions were answered to the patient's satisfaction.

## 2019-03-25 ENCOUNTER — Encounter
Admission: RE | Admit: 2019-03-25 | Discharge: 2019-03-25 | Disposition: A | Discharge status: Home / Self Care | Payer: Medicare HMO | Source: Ambulatory Visit | Attending: Surgery | Admitting: Surgery

## 2019-03-25 ENCOUNTER — Other Ambulatory Visit: Payer: Self-pay

## 2019-03-25 NOTE — Patient Instructions (Signed)
Your procedure is scheduled on: 04/02/2019 Thurs Report to Same Day Surgery 2nd floor medical mall [facility] Entrance-take elevator on left to 2nd floor.  Check in with surgery information desk.) To find out your arrival time please call [phone] between 1PM - 3PM on 04/01/2019 Wed  Remember: Instructions that are not followed completely may result in serious medical risk, up to and including death, or upon the discretion of your surgeon and anesthesiologist your surgery may need to be rescheduled.    _x___ 1. Do not eat food after midnight the night before your procedure. You may drink clear liquids up to 2 hours before you are scheduled to arrive at the hospital for your procedure.  Do not drink clear liquids within 2 hours of your scheduled arrival to the hospital.  Clear liquids include  --Water or Apple juice without pulp  --Clear carbohydrate beverage such as ClearFast or Gatorade  --Black Coffee or Clear Tea (No milk, no creamers, do not add anything to                  the coffee or Tea Type 1 and type 2 diabetics should only drink water.   ____Ensure clear carbohydrate drink on the way to the hospital for bariatric patients  ____Ensure clear carbohydrate drink 3 hours before surgery.   No gum chewing or hard candies.     __x__ 2. No Alcohol for 24 hours before or after surgery.   __x__3. No Smoking or e-cigarettes for 24 prior to surgery.  Do not use any chewable tobacco products for at least 6 hour prior to surgery   ____  4. Bring all medications with you on the day of surgery if instructed.    __x__ 5. Notify your doctor if there is any change in your medical condition     (cold, fever, infections).    x___6. On the morning of surgery brush your teeth with toothpaste and water.  You may rinse your mouth with mouth wash if you wish.  Do not swallow any toothpaste or mouthwash.   Do not wear jewelry, make-up, hairpins, clips or nail polish.  Do not wear lotions,  powders, or perfumes. You may wear deodorant.  Do not shave 48 hours prior to surgery. Men may shave face and neck.  Do not bring valuables to the hospital.    [facility] is not responsible for any belongings or valuables.               Contacts, dentures or bridgework may not be worn into surgery.  Leave your suitcase in the car. After surgery it may be brought to your room.  For patients admitted to the hospital, discharge time is determined by your                       treatment team.  _  Patients discharged the day of surgery will not be allowed to drive home.  You will need someone to drive you home and stay with you the night of your procedure.    Please read over the following fact sheets that you were given:   [facility] Preparing for Surgery and or MRSA Information   _x___ Take anti-hypertensive listed below, cardiac, seizure, asthma,     anti-reflux and psychiatric medicines. These include:  1. diltiazem (TAZTIA XT) 120 MG 24 hr capsule  2.  3.  4.  5.  6.  ____Fleets enema or Magnesium Citrate as directed.  _x___ Use CHG Soap or sage wipes as directed on instruction sheet   ____ Use inhalers on the day of surgery and bring to hospital day of surgery  ____ Stop Metformin and Janumet 2 days prior to surgery.    ____ Take 1/2 of usual insulin dose the night before surgery and none on the morning     surgery.   _x___ Follow recommendations from Cardiologist, Pulmonologist or PCP regarding          stopping Aspirin, Coumadin, Plavix ,Eliquis, Effient, or Pradaxa, and Pletal. Physician instructed patient to stop Coumadin on Sat 03/28/2019  X____Stop Anti-inflammatories such as Advil, Aleve, Ibuprofen, Motrin, Naproxen, Naprosyn, Goodies powders or aspirin products. OK to take Tylenol and                          Celebrex.   _x___ Stop supplements until after surgery.  But may continue Vitamin D, Vitamin B,       and multivitamin.   ____ Bring C-Pap to the hospital.

## 2019-03-30 ENCOUNTER — Other Ambulatory Visit: Payer: Self-pay

## 2019-03-30 ENCOUNTER — Other Ambulatory Visit
Admission: RE | Admit: 2019-03-30 | Discharge: 2019-03-30 | Disposition: A | Discharge status: Home / Self Care | Payer: Medicare HMO | Source: Ambulatory Visit | Attending: Surgery | Admitting: Surgery

## 2019-03-30 DIAGNOSIS — Z20828 Contact with and (suspected) exposure to other viral communicable diseases: Secondary | ICD-10-CM | POA: Diagnosis not present

## 2019-03-30 DIAGNOSIS — Z01812 Encounter for preprocedural laboratory examination: Secondary | ICD-10-CM | POA: Diagnosis not present

## 2019-03-30 LAB — SARS CORONAVIRUS 2 (TAT 6-24 HRS): SARS Coronavirus 2: NEGATIVE

## 2019-04-01 ENCOUNTER — Encounter: Payer: Self-pay | Admitting: Anesthesiology

## 2019-04-02 ENCOUNTER — Ambulatory Visit
Admission: RE | Admit: 2019-04-02 | Discharge: 2019-04-02 | Disposition: A | Discharge status: Home / Self Care | Payer: Medicare HMO | Source: Ambulatory Visit | Attending: Surgery | Admitting: Surgery

## 2019-04-02 ENCOUNTER — Other Ambulatory Visit: Payer: Self-pay

## 2019-04-02 ENCOUNTER — Encounter: Payer: Self-pay | Admitting: Emergency Medicine

## 2019-04-02 ENCOUNTER — Ambulatory Visit: Payer: Medicare HMO | Admitting: Anesthesiology

## 2019-04-02 ENCOUNTER — Observation Stay
Admission: RE | Admit: 2019-04-02 | Discharge: 2019-04-03 | Disposition: A | Discharge status: Home / Self Care | Payer: Medicare HMO | Attending: Surgery | Admitting: Surgery

## 2019-04-02 ENCOUNTER — Encounter
Admission: RE | Disposition: A | Discharge status: Home / Self Care | Payer: Self-pay | Source: Home / Self Care | Attending: Surgery

## 2019-04-02 DIAGNOSIS — Z8269 Family history of other diseases of the musculoskeletal system and connective tissue: Secondary | ICD-10-CM | POA: Insufficient documentation

## 2019-04-02 DIAGNOSIS — I482 Chronic atrial fibrillation, unspecified: Secondary | ICD-10-CM | POA: Diagnosis not present

## 2019-04-02 DIAGNOSIS — E785 Hyperlipidemia, unspecified: Secondary | ICD-10-CM | POA: Diagnosis not present

## 2019-04-02 DIAGNOSIS — Z923 Personal history of irradiation: Secondary | ICD-10-CM | POA: Diagnosis not present

## 2019-04-02 DIAGNOSIS — Z7901 Long term (current) use of anticoagulants: Secondary | ICD-10-CM | POA: Insufficient documentation

## 2019-04-02 DIAGNOSIS — Z86010 Personal history of colonic polyps: Secondary | ICD-10-CM | POA: Diagnosis not present

## 2019-04-02 DIAGNOSIS — C50911 Malignant neoplasm of unspecified site of right female breast: Secondary | ICD-10-CM

## 2019-04-02 DIAGNOSIS — M19249 Secondary osteoarthritis, unspecified hand: Secondary | ICD-10-CM | POA: Insufficient documentation

## 2019-04-02 DIAGNOSIS — Z87891 Personal history of nicotine dependence: Secondary | ICD-10-CM | POA: Diagnosis not present

## 2019-04-02 DIAGNOSIS — Z79810 Long term (current) use of selective estrogen receptor modulators (SERMs): Secondary | ICD-10-CM | POA: Insufficient documentation

## 2019-04-02 DIAGNOSIS — M810 Age-related osteoporosis without current pathological fracture: Secondary | ICD-10-CM | POA: Diagnosis not present

## 2019-04-02 DIAGNOSIS — Z888 Allergy status to other drugs, medicaments and biological substances status: Secondary | ICD-10-CM | POA: Insufficient documentation

## 2019-04-02 DIAGNOSIS — Z9842 Cataract extraction status, left eye: Secondary | ICD-10-CM | POA: Insufficient documentation

## 2019-04-02 DIAGNOSIS — C50211 Malignant neoplasm of upper-inner quadrant of right female breast: Secondary | ICD-10-CM

## 2019-04-02 DIAGNOSIS — Z79899 Other long term (current) drug therapy: Secondary | ICD-10-CM | POA: Diagnosis not present

## 2019-04-02 DIAGNOSIS — E559 Vitamin D deficiency, unspecified: Secondary | ICD-10-CM | POA: Diagnosis not present

## 2019-04-02 DIAGNOSIS — C50919 Malignant neoplasm of unspecified site of unspecified female breast: Secondary | ICD-10-CM | POA: Diagnosis present

## 2019-04-02 DIAGNOSIS — Z885 Allergy status to narcotic agent status: Secondary | ICD-10-CM | POA: Diagnosis not present

## 2019-04-02 DIAGNOSIS — Z91030 Bee allergy status: Secondary | ICD-10-CM | POA: Insufficient documentation

## 2019-04-02 DIAGNOSIS — R011 Cardiac murmur, unspecified: Secondary | ICD-10-CM | POA: Insufficient documentation

## 2019-04-02 DIAGNOSIS — H905 Unspecified sensorineural hearing loss: Secondary | ICD-10-CM | POA: Diagnosis not present

## 2019-04-02 DIAGNOSIS — D0511 Intraductal carcinoma in situ of right breast: Principal | ICD-10-CM | POA: Insufficient documentation

## 2019-04-02 DIAGNOSIS — I340 Nonrheumatic mitral (valve) insufficiency: Secondary | ICD-10-CM | POA: Insufficient documentation

## 2019-04-02 DIAGNOSIS — Z981 Arthrodesis status: Secondary | ICD-10-CM | POA: Insufficient documentation

## 2019-04-02 DIAGNOSIS — Z853 Personal history of malignant neoplasm of breast: Secondary | ICD-10-CM | POA: Insufficient documentation

## 2019-04-02 DIAGNOSIS — I10 Essential (primary) hypertension: Secondary | ICD-10-CM | POA: Diagnosis not present

## 2019-04-02 DIAGNOSIS — Z170 Estrogen receptor positive status [ER+]: Secondary | ICD-10-CM

## 2019-04-02 DIAGNOSIS — Z9841 Cataract extraction status, right eye: Secondary | ICD-10-CM | POA: Diagnosis not present

## 2019-04-02 HISTORY — PX: BREAST IMPLANT REMOVAL: SHX5361

## 2019-04-02 HISTORY — PX: SIMPLE MASTECTOMY WITH AXILLARY SENTINEL NODE BIOPSY: SHX6098

## 2019-04-02 LAB — CBC
HCT: 36.7 % (ref 36.0–46.0)
Hemoglobin: 12.3 g/dL (ref 12.0–15.0)
MCH: 31.9 pg (ref 26.0–34.0)
MCHC: 33.5 g/dL (ref 30.0–36.0)
MCV: 95.1 fL (ref 80.0–100.0)
Platelets: 147 10*3/uL — ABNORMAL LOW (ref 150–400)
RBC: 3.86 MIL/uL — ABNORMAL LOW (ref 3.87–5.11)
RDW: 13.4 % (ref 11.5–15.5)
WBC: 4.8 10*3/uL (ref 4.0–10.5)
nRBC: 0 % (ref 0.0–0.2)

## 2019-04-02 LAB — CREATININE, SERUM
Creatinine, Ser: 0.39 mg/dL — ABNORMAL LOW (ref 0.44–1.00)
GFR calc Af Amer: >60 mL/min (ref >60)
GFR calc non Af Amer: >60 mL/min (ref >60)

## 2019-04-02 LAB — PROTIME-INR
INR: 1.3 — ABNORMAL HIGH (ref 0.8–1.2)
Prothrombin Time: 15.7 seconds — ABNORMAL HIGH (ref 11.4–15.2)

## 2019-04-02 SURGERY — SIMPLE MASTECTOMY
Anesthesia: General | Site: Breast | Laterality: Right

## 2019-04-02 MED ORDER — BUPIVACAINE LIPOSOME 1.3 % IJ SUSP
INTRAMUSCULAR | Status: DC | PRN
  Administered 2019-04-02: 12:00:00 35 mL

## 2019-04-02 MED ORDER — ENOXAPARIN SODIUM 40 MG/0.4ML ~~LOC~~ SOLN
40.0000 mg | SUBCUTANEOUS | Status: DC
  Administered 2019-04-03: 09:00:00 40 mg via SUBCUTANEOUS
  Filled 2019-04-02: qty 0.4

## 2019-04-02 MED ORDER — ONDANSETRON HCL 4 MG/2ML IJ SOLN: 4.0000 mg | Freq: Four times a day (QID) | INTRAMUSCULAR | Status: DC | PRN

## 2019-04-02 MED ORDER — LORATADINE 10 MG PO TABS
10.0000 mg | ORAL_TABLET | Freq: Every day | ORAL | Status: DC
  Administered 2019-04-03: 09:00:00 10 mg via ORAL
  Filled 2019-04-02: qty 1

## 2019-04-02 MED ORDER — VITAMIN D 25 MCG (1000 UNIT) PO TABS
5000.0000 [IU] | ORAL_TABLET | ORAL | Status: DC
  Administered 2019-04-02: 5000 [IU] via ORAL
  Filled 2019-04-02: qty 5

## 2019-04-02 MED ORDER — BUPIVACAINE-EPINEPHRINE (PF) 0.5% -1:200000 IJ SOLN
INTRAMUSCULAR | Status: DC | PRN
  Administered 2019-04-02: 15 mL via PERINEURAL

## 2019-04-02 MED ORDER — GABAPENTIN 300 MG PO CAPS
300.0000 mg | ORAL_CAPSULE | ORAL | Status: AC
  Administered 2019-04-02: 10:00:00 300 mg via ORAL

## 2019-04-02 MED ORDER — BUPIVACAINE LIPOSOME 1.3 % IJ SUSP
INTRAMUSCULAR | Status: AC
  Filled 2019-04-02: qty 20

## 2019-04-02 MED ORDER — IBUPROFEN 400 MG PO TABS: 600.0000 mg | ORAL_TABLET | Freq: Four times a day (QID) | ORAL | Status: DC | PRN

## 2019-04-02 MED ORDER — MELATONIN 5 MG PO TABS
2.5000 mg | ORAL_TABLET | Freq: Every day | ORAL | Status: DC
  Administered 2019-04-02: 22:00:00 2.5 mg via ORAL
  Filled 2019-04-02 (×2): qty 0.5

## 2019-04-02 MED ORDER — GABAPENTIN 300 MG PO CAPS
ORAL_CAPSULE | ORAL | Status: AC
  Administered 2019-04-02: 10:00:00 300 mg via ORAL
  Filled 2019-04-02: qty 1

## 2019-04-02 MED ORDER — CELECOXIB 200 MG PO CAPS
200.0000 mg | ORAL_CAPSULE | Freq: Two times a day (BID) | ORAL | Status: DC
  Filled 2019-04-02 (×3): qty 1

## 2019-04-02 MED ORDER — FENTANYL CITRATE (PF) 100 MCG/2ML IJ SOLN
INTRAMUSCULAR | Status: AC
  Filled 2019-04-02: qty 2

## 2019-04-02 MED ORDER — PROPOFOL 10 MG/ML IV BOLUS
INTRAVENOUS | Status: AC
  Filled 2019-04-02: qty 20

## 2019-04-02 MED ORDER — PHENYLEPHRINE HCL (PRESSORS) 10 MG/ML IV SOLN
INTRAVENOUS | Status: DC | PRN
  Administered 2019-04-02: 100 ug via INTRAVENOUS

## 2019-04-02 MED ORDER — LACTATED RINGERS IV SOLN
INTRAVENOUS | Status: DC
  Administered 2019-04-02 (×2): via INTRAVENOUS

## 2019-04-02 MED ORDER — GABAPENTIN 300 MG PO CAPS
300.0000 mg | ORAL_CAPSULE | Freq: Two times a day (BID) | ORAL | Status: DC
  Administered 2019-04-02 – 2019-04-03 (×2): 300 mg via ORAL
  Filled 2019-04-02 (×3): qty 1

## 2019-04-02 MED ORDER — FAMOTIDINE 20 MG PO TABS
20.0000 mg | ORAL_TABLET | Freq: Once | ORAL | Status: AC
  Administered 2019-04-02: 10:00:00 20 mg via ORAL

## 2019-04-02 MED ORDER — FENTANYL CITRATE (PF) 100 MCG/2ML IJ SOLN
INTRAMUSCULAR | Status: DC | PRN
  Administered 2019-04-02 (×2): 50 ug via INTRAVENOUS
  Administered 2019-04-02 (×2): 25 ug via INTRAVENOUS
  Administered 2019-04-02: 50 ug via INTRAVENOUS

## 2019-04-02 MED ORDER — SODIUM CHLORIDE (PF) 0.9 % IJ SOLN
INTRAMUSCULAR | Status: DC | PRN
  Administered 2019-04-02: 60 mL via INTRAVENOUS

## 2019-04-02 MED ORDER — FENTANYL CITRATE (PF) 100 MCG/2ML IJ SOLN: 25.0000 ug | INTRAMUSCULAR | Status: DC | PRN

## 2019-04-02 MED ORDER — ESMOLOL HCL 100 MG/10ML IV SOLN
INTRAVENOUS | Status: DC | PRN
  Administered 2019-04-02 (×3): 10 mg via INTRAVENOUS

## 2019-04-02 MED ORDER — ONDANSETRON HCL 4 MG/2ML IJ SOLN: 4.0000 mg | Freq: Once | INTRAMUSCULAR | Status: DC | PRN

## 2019-04-02 MED ORDER — ACETAMINOPHEN 325 MG PO TABS: 650.0000 mg | ORAL_TABLET | Freq: Four times a day (QID) | ORAL | Status: DC | PRN

## 2019-04-02 MED ORDER — EPHEDRINE SULFATE 50 MG/ML IJ SOLN
INTRAMUSCULAR | Status: DC | PRN
  Administered 2019-04-02: 10 mg via INTRAVENOUS

## 2019-04-02 MED ORDER — CEFAZOLIN SODIUM-DEXTROSE 2-4 GM/100ML-% IV SOLN
INTRAVENOUS | Status: AC
  Filled 2019-04-02: qty 100

## 2019-04-02 MED ORDER — FAMOTIDINE 20 MG PO TABS
ORAL_TABLET | ORAL | Status: AC
  Administered 2019-04-02: 10:00:00 20 mg via ORAL
  Filled 2019-04-02: qty 1

## 2019-04-02 MED ORDER — LIDOCAINE HCL (CARDIAC) PF 100 MG/5ML IV SOSY
PREFILLED_SYRINGE | INTRAVENOUS | Status: DC | PRN
  Administered 2019-04-02: 100 mg via INTRAVENOUS

## 2019-04-02 MED ORDER — RED YEAST RICE EXTRACT 600 MG PO CAPS: 600.0000 mg | ORAL_CAPSULE | Freq: Every day | ORAL | Status: DC

## 2019-04-02 MED ORDER — BUPIVACAINE HCL (PF) 0.5 % IJ SOLN
INTRAMUSCULAR | Status: AC
  Filled 2019-04-02: qty 30

## 2019-04-02 MED ORDER — VITAMIN C 500 MG PO TABS: 1000.0000 mg | ORAL_TABLET | ORAL | Status: DC

## 2019-04-02 MED ORDER — BUPIVACAINE-EPINEPHRINE (PF) 0.5% -1:200000 IJ SOLN
INTRAMUSCULAR | Status: AC
  Filled 2019-04-02: qty 30

## 2019-04-02 MED ORDER — DILTIAZEM HCL ER COATED BEADS 120 MG PO CP24
120.0000 mg | ORAL_CAPSULE | Freq: Two times a day (BID) | ORAL | Status: DC
  Administered 2019-04-03: 09:00:00 120 mg via ORAL
  Filled 2019-04-02 (×2): qty 1

## 2019-04-02 MED ORDER — CELECOXIB 200 MG PO CAPS
200.0000 mg | ORAL_CAPSULE | ORAL | Status: AC
  Administered 2019-04-02: 10:00:00 200 mg via ORAL

## 2019-04-02 MED ORDER — ACETAMINOPHEN 500 MG PO TABS
1000.0000 mg | ORAL_TABLET | ORAL | Status: AC
  Administered 2019-04-02: 10:00:00 1000 mg via ORAL

## 2019-04-02 MED ORDER — GLYCOPYRROLATE 0.2 MG/ML IJ SOLN
INTRAMUSCULAR | Status: DC | PRN
  Administered 2019-04-02: 0.2 mg via INTRAVENOUS

## 2019-04-02 MED ORDER — LIDOCAINE HCL (PF) 2 % IJ SOLN
INTRAMUSCULAR | Status: AC
  Filled 2019-04-02: qty 10

## 2019-04-02 MED ORDER — TECHNETIUM TC 99M SULFUR COLLOID FILTERED
0.7700 | Freq: Once | INTRAVENOUS | Status: AC | PRN
  Administered 2019-04-02: 09:00:00 0.77 via INTRADERMAL

## 2019-04-02 MED ORDER — LIDOCAINE HCL (PF) 1 % IJ SOLN
INTRAMUSCULAR | Status: AC
  Filled 2019-04-02: qty 30

## 2019-04-02 MED ORDER — ONDANSETRON 4 MG PO TBDP: 4.0000 mg | ORAL_TABLET | Freq: Four times a day (QID) | ORAL | Status: DC | PRN

## 2019-04-02 MED ORDER — CEFAZOLIN SODIUM-DEXTROSE 2-4 GM/100ML-% IV SOLN
2.0000 g | INTRAVENOUS | Status: AC
  Administered 2019-04-02: 10:00:00 2 g via INTRAVENOUS

## 2019-04-02 MED ORDER — BUPIVACAINE LIPOSOME 1.3 % IJ SUSP: INTRAMUSCULAR | Status: DC | PRN

## 2019-04-02 MED ORDER — CELECOXIB 200 MG PO CAPS
ORAL_CAPSULE | ORAL | Status: AC
  Administered 2019-04-02: 200 mg via ORAL
  Filled 2019-04-02: qty 1

## 2019-04-02 MED ORDER — CHLORHEXIDINE GLUCONATE CLOTH 2 % EX PADS: 6.0000 | MEDICATED_PAD | Freq: Once | CUTANEOUS | Status: DC

## 2019-04-02 MED ORDER — ONDANSETRON HCL 4 MG/2ML IJ SOLN
INTRAMUSCULAR | Status: DC | PRN
  Administered 2019-04-02 (×2): 4 mg via INTRAVENOUS

## 2019-04-02 MED ORDER — ACETAMINOPHEN 500 MG PO TABS
ORAL_TABLET | ORAL | Status: AC
  Administered 2019-04-02: 1000 mg via ORAL
  Filled 2019-04-02: qty 2

## 2019-04-02 MED ORDER — PROPOFOL 10 MG/ML IV BOLUS
INTRAVENOUS | Status: DC | PRN
  Administered 2019-04-02: 100 mg via INTRAVENOUS

## 2019-04-02 MED ORDER — DEXAMETHASONE SODIUM PHOSPHATE 10 MG/ML IJ SOLN
INTRAMUSCULAR | Status: DC | PRN
  Administered 2019-04-02: 10 mg via INTRAVENOUS

## 2019-04-02 SURGICAL SUPPLY — 57 items
APPLIER CLIP 11 MED OPEN (CLIP)
BINDER BREAST LRG (GAUZE/BANDAGES/DRESSINGS) IMPLANT
BINDER BREAST MEDIUM (GAUZE/BANDAGES/DRESSINGS) ×4 IMPLANT
BINDER BREAST XLRG (GAUZE/BANDAGES/DRESSINGS) IMPLANT
BINDER BREAST XXLRG (GAUZE/BANDAGES/DRESSINGS) IMPLANT
BLADE SURG 15 STRL LF DISP TIS (BLADE) ×2 IMPLANT
BLADE SURG 15 STRL SS (BLADE) ×2
BULB RESERV EVAC DRAIN JP 100C (MISCELLANEOUS) ×4 IMPLANT
CANISTER SUCT 1200ML W/VALVE (MISCELLANEOUS) ×4 IMPLANT
CHLORAPREP W/TINT 26 (MISCELLANEOUS) ×4 IMPLANT
CLIP APPLIE 11 MED OPEN (CLIP) IMPLANT
CNTNR SPEC 2.5X3XGRAD LEK (MISCELLANEOUS)
CONT SPEC 4OZ STER OR WHT (MISCELLANEOUS)
CONTAINER SPEC 2.5X3XGRAD LEK (MISCELLANEOUS) IMPLANT
COVER WAND RF STERILE (DRAPES) IMPLANT
DERMABOND ADVANCED (GAUZE/BANDAGES/DRESSINGS) ×2
DERMABOND ADVANCED .7 DNX12 (GAUZE/BANDAGES/DRESSINGS) ×2 IMPLANT
DRAIN CHANNEL JP 15F RND 16 (MISCELLANEOUS) ×4 IMPLANT
DRAPE 3/4 80X56 (DRAPES) IMPLANT
DRAPE LAPAROTOMY TRNSV 106X77 (MISCELLANEOUS) ×4 IMPLANT
DRSG GAUZE FLUFF 36X18 (GAUZE/BANDAGES/DRESSINGS) ×4 IMPLANT
ELECT CAUTERY BLADE 6.4 (BLADE) IMPLANT
ELECT CAUTERY BLADE TIP 2.5 (TIP) ×4
ELECT REM PT RETURN 9FT ADLT (ELECTROSURGICAL) ×4
ELECTRODE CAUTERY BLDE TIP 2.5 (TIP) ×2 IMPLANT
ELECTRODE REM PT RTRN 9FT ADLT (ELECTROSURGICAL) ×2 IMPLANT
GAUZE SPONGE 4X4 12PLY STRL (GAUZE/BANDAGES/DRESSINGS) IMPLANT
GLOVE BIOGEL PI IND STRL 7.0 (GLOVE) ×8 IMPLANT
GLOVE BIOGEL PI INDICATOR 7.0 (GLOVE) ×8
GLOVE SURG SYN 6.5 ES PF (GLOVE) ×20 IMPLANT
GOWN STRL REUS W/ TWL LRG LVL3 (GOWN DISPOSABLE) ×8 IMPLANT
GOWN STRL REUS W/TWL LRG LVL3 (GOWN DISPOSABLE) ×8
JACKSON PRATT 10 (INSTRUMENTS) IMPLANT
KIT TURNOVER KIT A (KITS) ×4 IMPLANT
LABEL OR SOLS (LABEL) ×4 IMPLANT
LIGHT WAVEGUIDE WIDE FLAT (MISCELLANEOUS) IMPLANT
NEEDLE HYPO 22GX1.5 SAFETY (NEEDLE) ×4 IMPLANT
PACK BASIN MINOR ARMC (MISCELLANEOUS) ×4 IMPLANT
SLEVE PROBE SENORX GAMMA FIND (MISCELLANEOUS) ×4 IMPLANT
SPONGE LAP 18X18 RF (DISPOSABLE) ×4 IMPLANT
STAPLER SKIN PROX 35W (STAPLE) ×4 IMPLANT
SUT ETHILON 3-0 FS-10 30 BLK (SUTURE) ×4
SUT MNCRL 4-0 (SUTURE) ×2
SUT MNCRL 4-0 27XMFL (SUTURE) ×2
SUT SILK 2 0 (SUTURE) ×2
SUT SILK 2 0 SH (SUTURE) ×8 IMPLANT
SUT SILK 2-0 30XBRD TIE 12 (SUTURE) ×2 IMPLANT
SUT SILK 3 0 12 30 (SUTURE) ×4 IMPLANT
SUT VIC AB 3-0 SH 27 (SUTURE) ×6
SUT VIC AB 3-0 SH 27X BRD (SUTURE) ×6 IMPLANT
SUTURE EHLN 3-0 FS-10 30 BLK (SUTURE) ×2 IMPLANT
SUTURE MNCRL 4-0 27XMF (SUTURE) ×2 IMPLANT
SYR 10ML LL (SYRINGE) ×4 IMPLANT
SYR 20ML LL LF (SYRINGE) ×4 IMPLANT
SYR 50ML LL SCALE MARK (SYRINGE) ×4 IMPLANT
SYR BULB IRRIG 60ML STRL (SYRINGE) ×4 IMPLANT
WATER STERILE IRR 1000ML POUR (IV SOLUTION) ×4 IMPLANT

## 2019-04-02 NOTE — Anesthesia Postprocedure Evaluation (Signed)
Anesthesia Post Note  Patient: Sierra Green  Procedure(s) Performed: Right simple mastectomy and implant removal (Right Breast) REMOVAL BREAST IMPLANTS (Right )  Patient location during evaluation: PACU Anesthesia Type: General Level of consciousness: awake and alert Pain management: pain level controlled Vital Signs Assessment: post-procedure vital signs reviewed and stable Respiratory status: spontaneous breathing, nonlabored ventilation, respiratory function stable and patient connected to nasal cannula oxygen Cardiovascular status: blood pressure returned to baseline and stable Postop Assessment: no apparent nausea or vomiting Anesthetic complications: no     Last Vitals:  Vitals:   04/02/19 1333 04/02/19 1407  BP: 107/62 103/68  Pulse: 100 97  Resp: 18 17  Temp: 36.6 C 36.5 C  SpO2: 94% 95%    Last Pain:  Vitals:   04/02/19 1407  TempSrc: Oral  PainSc:                  Teandra Harlan S

## 2019-04-02 NOTE — Progress Notes (Signed)
Patient was admitted to [facility] general surgery from PACU at 1500. Patient's pain is a 1 and there were 65ml of bright red drainage from the JP drain at 1830. Vital signs stable.   Marry Guan, RN 04/02/2019 7:08 PM

## 2019-04-02 NOTE — Anesthesia Procedure Notes (Signed)
Procedure Name: LMA Insertion Date/Time: 04/02/2019 10:35 AM Performed by: Nelda Marseille, CRNA Pre-anesthesia Checklist: Patient identified, Patient being monitored, Timeout performed, Emergency Drugs available and Suction available Patient Re-evaluated:Patient Re-evaluated prior to induction Oxygen Delivery Method: Circle system utilized Preoxygenation: Pre-oxygenation with 100% oxygen Induction Type: IV induction Ventilation: Mask ventilation without difficulty LMA: LMA inserted LMA Size: 3.5 Tube type: Oral Number of attempts: 1 Placement Confirmation: positive ETCO2 and breath sounds checked- equal and bilateral Tube secured with: Tape Dental Injury: Teeth and Oropharynx as per pre-operative assessment

## 2019-04-02 NOTE — Anesthesia Preprocedure Evaluation (Signed)
Anesthesia Evaluation  Patient identified by MRN, date of birth, ID band Patient awake    Reviewed: Allergy & Precautions, NPO status , Patient's Chart, lab work & pertinent test results, reviewed documented beta blocker date and time   Airway Mallampati: II  TM Distance: >3 FB     Dental  (+) Chipped   Pulmonary former smoker,           Cardiovascular hypertension, Pt. on medications + dysrhythmias Atrial Fibrillation      Neuro/Psych    GI/Hepatic   Endo/Other    Renal/GU      Musculoskeletal   Abdominal   Peds  Hematology   Anesthesia Other Findings AV stenosis.  Reproductive/Obstetrics                             Anesthesia Physical Anesthesia Plan  ASA: III  Anesthesia Plan: General   Post-op Pain Management:    Induction: Intravenous  PONV Risk Score and Plan:   Airway Management Planned: LMA  Additional Equipment:   Intra-op Plan:   Post-operative Plan:   Informed Consent: I have reviewed the patients History and Physical, chart, labs and discussed the procedure including the risks, benefits and alternatives for the proposed anesthesia with the patient or authorized representative who has indicated his/her understanding and acceptance.       Plan Discussed with: CRNA  Anesthesia Plan Comments:         Anesthesia Quick Evaluation

## 2019-04-02 NOTE — Interval H&P Note (Signed)
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  The various methods of treatment have been discussed with the patient and family. After consideration of risks, benefits and other options for treatment, the patient has consented to  Procedure(s): [place] (Right) as a surgical intervention.  The patient's history has been reviewed, patient examined, no change in status, stable for surgery.  I have reviewed the patient's chart and labs.  Questions were answered to the patient's satisfaction.     Sierra Green

## 2019-04-02 NOTE — Progress Notes (Signed)
PHARMACIST - PHYSICIAN ORDER COMMUNICATION  CONCERNING: P&T Medication Policy on Herbal Medications  DESCRIPTION:  This patient's order for:  Red Yeast Rice Extract CAPS 600 mg  has been noted.  This product(s) is classified as an "herbal" or natural product. Due to a lack of definitive safety studies or FDA approval, nonstandard manufacturing practices, plus the potential risk of unknown drug-drug interactions while on inpatient medications, the Pharmacy and Therapeutics Committee does not permit the use of "herbal" or natural products of this type within [facility]   ACTION TAKEN: The pharmacy department is unable to verify this order at this time.  Please reevaluate patient's clinical condition at discharge and address if the herbal or natural product(s) should be resumed at that time.  Pernell Dupre, PharmD, BCPS Clinical Pharmacist 04/02/2019 1:38 PM

## 2019-04-02 NOTE — Transfer of Care (Signed)
Immediate Anesthesia Transfer of Care Note  Patient: Sierra Green  Procedure(s) Performed: Right simple mastectomy and implant removal (Right Breast)  Patient Location: PACU  Anesthesia Type:General  Level of Consciousness: awake, alert  and oriented  Airway & Oxygen Therapy: Patient Spontanous Breathing and Patient connected to face mask oxygen  Post-op Assessment: Report given to RN and Post -op Vital signs reviewed and stable  Post vital signs: Reviewed and stable  Last Vitals:  Vitals Value Taken Time  BP    Temp    Pulse    Resp    SpO2      Last Pain:  Vitals:   04/02/19 0937  TempSrc: Tympanic  PainSc: 0-No pain         Complications: No apparent anesthesia complications

## 2019-04-02 NOTE — Op Note (Signed)
Preoperative diagnosis: Right DCIS, recurrent  Postoperative diagnosis: same.   Procedure: Right simple mastectomy. Implant removal  Anesthesia: GETA  Surgeon: Dr. Lysle Pearl  Wound Classification: Clean  Specimen: right Breast, inferior margin  Complications: None  Estimated Blood Loss: 25 mL   Indications: Patient is a 78 y.o. female who had an abnormal mammogram that on workup with core needle biopsy was found to be DCIS. After discussion of alternatives, the patient elected (simple) mastectomy.  Please see H&P for details.  Findings: 1. Attenuated chest wall and skin secondary to hx of implant placement 2. Unsuccessful mapping of SLNB secondary to previous surgery  Description of procedure: The patient was brought to the operating room and general anesthesia was induced. A time-out was completed verifying correct patient, procedure, site, positioning, and implant(s) and/or special equipment prior to beginning this procedure. The breast, chest wall, axilla, and upper arm and neck were prepped and draped in the usual sterile fashion.  A skin incision was made that encompassed the nipple-areola complex and the previous inferior reconstruction incision and passed in an oblique direction across the breast.  The very attenuated pectoralis muscle was then incisied at the inferior aspectto enter the implant capsule, at which point the saline implant was easily removed.    Hand-held gamma probe was used to attempt to identify the location of the hottest spot in the axilla, thorough the lateral edge of the original incision. This proved unsuccessful since no areas were noted to have increasing count compared to background noise.   Decision was made at this point to not pursue any additional axillary lymph node sampling, due to the likelihood of lymph node disease been minimal with biopsy result of DCIS, and no abnormal findings on previous imaging.  Skin flaps were raised in the avascular plane  between subcutaneous tissue and breast tissue from the clavicle superiorly, the sternum medially, the anterior rectus sheath inferiorly, and past the lateral border of the pectoralis major muscle laterally. Hemostasis was achieved in the flaps. Next, the breast tissue and underlying pectoralis fascia were excised from the pectoralis major muscle, progressing from medially to laterally. At the lateral border of the pectoralis major muscle, the breast tissue was swung laterally and a lateral pedicle identified where breast tissue gave way to fat of axilla. The lateral pedicle was incised and the specimen removed.   Specimen marked long lateral, short superior and sent to pathology.  Re-examination of the inferior border noted additional breast tissue, and this was removed and marked with short superior, long lateral, and double deep and sent off operative field pending pathology.    The wound was irrigated and hemostasis was achieved. The incision to remove the implant at the pec major layer was closed with running 2-0 vicryl.  Closed suction drain was brought into the operative field through a separate stab incision and sutured to the skin with a 3-0 nylon suture. The wound was closed with combination of interrupted and running 3-0 Vicryl to the subcutaneous layer, followed by a skin stapler. The wound was dressed with fluffs and mastectomy bra.  The patient tolerated the procedure well and was taken to the postanesthesia care unit in stable condition.

## 2019-04-02 NOTE — Anesthesia Post-op Follow-up Note (Signed)
Anesthesia QCDR form completed.        

## 2019-04-02 NOTE — OR Nursing (Signed)
Right breast implant removed and sent with specimen to pathology.

## 2019-04-03 ENCOUNTER — Other Ambulatory Visit: Payer: Self-pay

## 2019-04-03 ENCOUNTER — Encounter: Payer: Self-pay | Admitting: Surgery

## 2019-04-03 DIAGNOSIS — D0511 Intraductal carcinoma in situ of right breast: Secondary | ICD-10-CM | POA: Diagnosis not present

## 2019-04-03 LAB — CBC
HCT: 37.2 % (ref 36.0–46.0)
Hemoglobin: 12.2 g/dL (ref 12.0–15.0)
MCH: 31.3 pg (ref 26.0–34.0)
MCHC: 32.8 g/dL (ref 30.0–36.0)
MCV: 95.4 fL (ref 80.0–100.0)
Platelets: 160 10*3/uL (ref 150–400)
RBC: 3.9 MIL/uL (ref 3.87–5.11)
RDW: 13.2 % (ref 11.5–15.5)
WBC: 13.3 10*3/uL — ABNORMAL HIGH (ref 4.0–10.5)
nRBC: 0 % (ref 0.0–0.2)

## 2019-04-03 LAB — BASIC METABOLIC PANEL
Anion gap: 8 (ref 5–15)
BUN: 12 mg/dL (ref 8–23)
CO2: 24 mmol/L (ref 22–32)
Calcium: 8.9 mg/dL (ref 8.9–10.3)
Chloride: 105 mmol/L (ref 98–111)
Creatinine, Ser: 0.59 mg/dL (ref 0.44–1.00)
GFR calc Af Amer: >60 mL/min (ref >60)
GFR calc non Af Amer: >60 mL/min (ref >60)
Glucose, Bld: 165 mg/dL — ABNORMAL HIGH (ref 70–99)
Potassium: 4.7 mmol/L (ref 3.5–5.1)
Sodium: 137 mmol/L (ref 135–145)

## 2019-04-03 MED ORDER — DOCUSATE SODIUM 100 MG PO CAPS: 100.0000 mg | ORAL_CAPSULE | Freq: Two times a day (BID) | ORAL | 0 refills | Status: AC | PRN

## 2019-04-03 MED ORDER — ACETAMINOPHEN 325 MG PO TABS: 650.0000 mg | ORAL_TABLET | Freq: Three times a day (TID) | ORAL | 0 refills | Status: AC | PRN

## 2019-04-03 NOTE — Discharge Summary (Signed)
Physician Discharge Summary  Patient ID: Sierra Green MRN: [id number] DOB/AGE: [birth year] 78 y.o.  Admit date: 04/02/2019 Discharge date: 04/03/2019  Admission Diagnoses: Right breast cancer  Discharge Diagnoses:  Same as above  Discharged Condition: good  Hospital Course: Admitted for above underwent simple mastectomy with attempted sentinel lymph node biopsy.  Placed in observation overnight and did well.  At time of discharge, incision clean dry and intact, minimal pain, tolerating regular diet, voiding well.  JP with minimal serosanguineous drainage.  Patient will be placed back on Coumadin and follow-up with her primary within a week, also follow-up with our office for wound check and likely drain removal at that time.  Consults: None  Discharge Exam: Blood pressure 107/67, pulse 80, temperature 98.1 F (36.7 C), temperature source Oral, resp. rate 20, weight 59 kg, SpO2 100 %. General appearance: alert, cooperative and no distress Skin: Right mastectomy incision clean dry and intact, no evidence of overlying skin ischemia.  Drain with serosanguineous drainage.  Minimal bruising noted.  Disposition:  Discharge disposition: 01-Home or Self Care       Discharge Instructions    Discharge patient   Complete by: As directed    Discharge disposition: 01-Home or Self Care   Discharge patient date: 04/03/2019     Allergies as of 04/03/2019      Reactions   Oxycodone Anaphylaxis   Zoledronic Acid Other (See Comments)   Iritis   Boniva [ibandronic Acid] Other (See Comments)   Flu like symptoms   Alendronate Other (See Comments)   Flu like symptoms   Honey Bee Venom Swelling   Marked local swelling      Medication List    STOP taking these medications   tamoxifen 20 MG tablet Commonly known as: NOLVADEX     TAKE these medications   acetaminophen 325 MG tablet Commonly known as: Tylenol Take 2 tablets (650 mg total) by mouth every 8 (eight) hours as needed  for mild pain.   docusate sodium 100 MG capsule Commonly known as: Colace Take 1 capsule (100 mg total) by mouth 2 (two) times daily as needed for up to 10 days for mild constipation.   fexofenadine 180 MG tablet Commonly known as: ALLEGRA Take 180 mg by mouth daily as needed for allergies.   Melatonin 1 MG Caps Take 1 mg by mouth at bedtime.   Red Yeast Rice Extract 600 MG Caps Take 600 mg by mouth at bedtime.   Taztia XT 120 MG 24 hr capsule Generic drug: diltiazem Take 120 mg by mouth 2 (two) times daily.   vitamin C 1000 MG tablet Take 1,000 mg by mouth 4 (four) times a week. [name], and Sat   Vitamin D3 125 MCG (5000 UT) Tabs Take 5,000 Units by mouth every Monday, Tuesday, Wednesday, Thursday, and Friday.   warfarin 5 MG tablet Commonly known as: COUMADIN Take 2.5-5 mg by mouth See admin instructions. Take 2.5 mg at bedtime on Mon and Fri. Take 5 mg at bedtime on Sun, Tue, Wed, Thur, and Sat      Follow-up Information    Ezequiel Kayser, MD. Call in 1 week(s).   Specialty: Internal Medicine Why: to monitor/adjust coumadin level after surgery Contact information: [place] [address] [phone]        Benjamine Sprague, DO Follow up in 1 week(s).   Specialty: Surgery Why: for wound check and possible drain removal Contact information: [place]  [address] [phone]            Total time spent arranging discharge was >60min. Signed: Benjamine Sprague 04/03/2019, 8:58 AM

## 2019-04-03 NOTE — Discharge Instructions (Signed)
BREAST SURGERY, Care After This sheet gives you information about how to care for yourself after your procedure. Your doctor may also give you more specific instructions. If you have problems or questions, contact your doctor. Follow these instructions at home: Care for cuts from surgery (incisions)     Follow instructions from your doctor about how to take care of your cuts from surgery. Make sure you: ? Wash your hands with soap and water before you change your bandage (dressing). If you cannot use soap and water, use hand sanitizer. ? Change your bandage as told by your doctor. ? Leave stitches (sutures), skin glue, or skin tape (adhesive) strips in place. They may need to stay in place for 2 weeks or longer. If tape strips get loose and curl up, you may trim the loose edges. Do not remove tape strips completely unless your doctor says it is okay.  Do not take baths, swim, or use a hot tub until your doctor says it is okay. OK TO SHOWER IN 24HRS FROM DATE OF SURGERY.   Monitor drain output and record daily output.  Call office for any change in consistency or significant increase in output  Check your surgical cut area every day for signs of infection. Check for: ? More redness, swelling, or pain. ? More fluid or blood. ? Warmth. ? Pus or a bad smell. Activity  Do not drive or use heavy machinery while taking prescription pain medicine.  Do not play contact sports until your doctor says it is okay.  Do not drive for 24 hours if you were given a medicine to help you relax (sedative).  Rest as needed. Do not return to work or school until your doctor says it is okay.  General instructions   tylenol as needed for discomfort.     325-650mg  every 8hrs to max of 3000mg /24hrs (including the 325mg  in every norco dose) for the tylenol.    To prevent or treat constipation while you are taking prescription pain medicine, your doctor may recommend that you: ? Drink enough fluid to keep  your pee (urine) clear or pale yellow. ? Take over-the-counter or prescription medicines. ? Eat foods that are high in fiber, such as fresh fruits and vegetables, whole grains, and beans. ? Limit foods that are high in fat and processed sugars, such as fried and sweet foods. Contact a doctor if:  You develop a rash.  You have more redness, swelling, or pain around your surgical cuts.  You have more fluid or blood coming from your surgical cuts.  Your surgical cuts feel warm to the touch.  You have pus or a bad smell coming from your surgical cuts.  You have a fever.  One or more of your surgical cuts breaks open. Get help right away if:  You have trouble breathing.  You have chest pain.  You have pain that is getting worse in your shoulders.  You faint or feel dizzy when you stand.  You have very bad pain in your belly (abdomen).  You are sick to your stomach (nauseous) for more than one day.  You have throwing up (vomiting) that lasts for more than one day.  You have leg pain. This information is not intended to replace advice given to you by your health care provider. Make sure you discuss any questions you have with your health care provider.

## 2019-04-03 NOTE — Progress Notes (Signed)
Sierra Green to be D/C'd Home per MD order.  Discussed prescriptions and follow up appointments with the patient. Prescriptions given to patient, medication list explained in detail. Pt verbalized understanding.  Allergies as of 04/03/2019      Reactions   Oxycodone Anaphylaxis   Zoledronic Acid Other (See Comments)   Iritis   Boniva [ibandronic Acid] Other (See Comments)   Flu like symptoms   Alendronate Other (See Comments)   Flu like symptoms   Honey Bee Venom Swelling   Marked local swelling      Medication List    STOP taking these medications   tamoxifen 20 MG tablet Commonly known as: NOLVADEX     TAKE these medications   acetaminophen 325 MG tablet Commonly known as: Tylenol Take 2 tablets (650 mg total) by mouth every 8 (eight) hours as needed for mild pain.   docusate sodium 100 MG capsule Commonly known as: Colace Take 1 capsule (100 mg total) by mouth 2 (two) times daily as needed for up to 10 days for mild constipation.   fexofenadine 180 MG tablet Commonly known as: ALLEGRA Take 180 mg by mouth daily as needed for allergies.   Melatonin 1 MG Caps Take 1 mg by mouth at bedtime.   Red Yeast Rice Extract 600 MG Caps Take 600 mg by mouth at bedtime.   Taztia XT 120 MG 24 hr capsule Generic drug: diltiazem Take 120 mg by mouth 2 (two) times daily.   vitamin C 1000 MG tablet Take 1,000 mg by mouth 4 (four) times a week. [name], and Sat   Vitamin D3 125 MCG (5000 UT) Tabs Take 5,000 Units by mouth every Monday, Tuesday, Wednesday, Thursday, and Friday.   warfarin 5 MG tablet Commonly known as: COUMADIN Take 2.5-5 mg by mouth See admin instructions. Take 2.5 mg at bedtime on Mon and Fri. Take 5 mg at bedtime on Sun, Tue, Wed, Thur, and Sat       Vitals:   04/03/19 0756 04/03/19 0810  BP: (!) 94/52 107/67  Pulse: 97 80  Resp: 20   Temp: 98.1 F (36.7 C)   SpO2: 96% 100%    Skin clean, dry and intact without evidence of skin break  down, no evidence of skin tears noted. IV catheter discontinued intact. Site without signs and symptoms of complications. Dressing and pressure applied. Pt denies pain at this time. No complaints noted.  An After Visit Summary was printed and given to the patient. Patient escorted via [place], and D/C home via private auto.  Fuller Mandril, RN

## 2019-04-05 NOTE — Progress Notes (Signed)
Sierra Green  [phone] Fax:(336) [phone]  ID: Sierra Green OB: Jul 08, 1940  MR#: [id number]  [id number]  Patient Care Team: Ezequiel Kayser, MD as PCP - General (Internal Medicine)    CHIEF COMPLAINT: Recurrent right breast DCIS, status post simple mastectomy.  INTERVAL HISTORY: Patient returns to clinic for further evaluation and discussion of final pathology results.  She tolerated her mastectomy well without significant problems.  She currently feels well and is asymptomatic. She has no neurologic complaints. She denies any recent fevers or illnesses. She has a good appetite and denies weight loss.  She denies any chest pain, shortness of breath, cough, or hemoptysis.  She denies any nausea, vomiting, constipation, or diarrhea. She has no urinary complaints.  Patient offers no specific complaints today.  REVIEW OF SYSTEMS:   Review of Systems  Constitutional: Negative.  Negative for fever, malaise/fatigue and weight loss.  Respiratory: Negative.  Negative for cough and shortness of breath.   Cardiovascular: Negative.  Negative for chest pain and leg swelling.  Gastrointestinal: Negative.  Negative for abdominal pain.  Genitourinary: Negative.  Negative for dysuria.  Musculoskeletal: Negative.  Negative for back pain.  Skin: Negative.  Negative for rash.  Neurological: Negative.  Negative for sensory change, focal weakness, weakness and headaches.  Endo/Heme/Allergies: Does not bruise/bleed easily.  Psychiatric/Behavioral: Negative.  The patient is not nervous/anxious.    As per HPI. Otherwise, a complete review of systems is negative.   PAST MEDICAL HISTORY: Past Medical History:  Diagnosis Date  . Allergy    Seasonal  . Back pain   . Cancer [facility]) 2013   left breast  . Chronic atrial fibrillation ([place])    s/p 3 ablations, multiple cardioversions  . Dysrhythmia 1998   A-fib  . Hyperlipidemia   . Hypertension   . Mitral valve  regurgitation   . Osteoporosis   . Personal history of radiation therapy 2017   RIGHT lumpectomy  . Sensorineural hearing loss (SNHL) of both ears    worst on right  . Vitamin D deficiency     PAST SURGICAL HISTORY: Past Surgical History:  Procedure Laterality Date  . AUGMENTATION MAMMAPLASTY Bilateral 10/2011  . BREAST BIOPSY Right 08/2015   US guided biopsy - DCIS  . BREAST BIOPSY Left 2013   [place]reot[place] biopsy - [place]  . BREAST BIOPSY Right 02/24/2019   Alberteen Sam, pending path  . BREAST IMPLANT REMOVAL Right 04/02/2019   Procedure: REMOVAL BREAST IMPLANTS;  Surgeon: Benjamine Sprague, DO;  Location: [facility] ORS;  Service: General;  Laterality: Right;  . BREAST LUMPECTOMY Right 2017  . BREAST LUMPECTOMY WITH NEEDLE LOCALIZATION Right 09/27/2015   Procedure: BREAST LUMPECTOMY WITH NEEDLE LOCALIZATION;  Surgeon: Hubbard Robinson, MD;  Location: [facility] ORS;  Service: General;  Laterality: Right;  . CATARACT EXTRACTION Bilateral Right- 05/2014; Left-06/2014  . COLONOSCOPY WITH PROPOFOL N/A 07/26/2017   Procedure: COLONOSCOPY WITH PROPOFOL;  Surgeon: [name], MD;  Location: [facility] ENDOSCOPY;  Service: Endoscopy;  Laterality: N/A;  . heart ablation     1 at [facility] and 2 at [facility]  . MASTECTOMY Left 07/2011   DCIS- [place], [place]  . PROXIMAL INTERPHALANGEAL FUSION (PIP) Right 05/15/2013   2nd, 3rd, and 5th digits  . SENTINEL NODE BIOPSY Right 09/27/2015   Procedure: SENTINEL NODE BIOPSY;  Surgeon: Hubbard Robinson, MD;  Location: [facility] ORS;  Service: General;  Laterality: Right;  . SIMPLE MASTECTOMY WITH AXILLARY SENTINEL NODE BIOPSY Right 04/02/2019   Procedure:  Right simple mastectomy and implant removal;  Surgeon: Benjamine Sprague, DO;  Location: [facility] ORS;  Service: General;  Laterality: Right;  . TONSILLECTOMY  as child  . TUBAL LIGATION Bilateral 1976   Laparoscopic    FAMILY HISTORY Family History  Problem Relation Age of Onset  . Breast cancer Neg Hx         ADVANCED DIRECTIVES:    HEALTH MAINTENANCE: Social History   Tobacco Use  . Smoking status: Former Smoker    Quit date: 07/02/1966    Years since quitting: 52.8  . Smokeless tobacco: Never Used  Substance Use Topics  . Alcohol use: Yes    Alcohol/week: 4.0 - 5.0 standard drinks    Types: 4 - 5 Glasses of wine per week  . Drug use: No     Colonoscopy:  PAP:  Bone density:  Lipid panel:  Allergies  Allergen Reactions  . Oxycodone Anaphylaxis  . Zoledronic Acid Other (See Comments)    Iritis  . Boniva [Ibandronic Acid] Other (See Comments)    Flu like symptoms  . Alendronate Other (See Comments)    Flu like symptoms  . Honey Bee Venom Swelling    Marked local swelling    Current Outpatient Medications  Medication Sig Dispense Refill  . acetaminophen (TYLENOL) 325 MG tablet Take 2 tablets (650 mg total) by mouth every 8 (eight) hours as needed for mild pain. 40 tablet 0  . Ascorbic Acid (VITAMIN C) 1000 MG tablet Take 1,000 mg by mouth 4 (four) times a week. [place]on Reek, [place], and Sat    . Cholecalciferol (VITAMIN D3) 5000 units TABS Take 5,000 Units by mouth every Monday, Tuesday, Wednesday, Thursday, and Friday.     . diltiazem (TAZTIA XT) 120 MG 24 hr capsule Take 120 mg by mouth 2 (two) times daily.     Marland Kitchen docusate sodium (COLACE) 100 MG capsule Take 1 capsule (100 mg total) by mouth 2 (two) times daily as needed for up to 10 days for mild constipation. 20 capsule 0  . fexofenadine (ALLEGRA) 180 MG tablet Take 180 mg by mouth daily as needed for allergies.     . Melatonin 1 MG CAPS Take 1 mg by mouth at bedtime.    . Red Yeast Rice Extract 600 MG CAPS Take 600 mg by mouth at bedtime.     Marland Kitchen warfarin (COUMADIN) 5 MG tablet Take 2.5-5 mg by mouth See admin instructions. Take 2.5 mg at bedtime on Mon and Fri. Take 5 mg at bedtime on Sun, Tue, Wed, Thur, and Sat    . letrozole (FEMARA) 2.5 MG tablet Take 1 tablet (2.5 mg total) by mouth daily. 90 tablet 3   No current  facility-administered medications for this visit.     OBJECTIVE: Vitals:   04/09/19 0911  BP: 130/66  Pulse: 87  Temp: (!) 97.5 F (36.4 C)     Body mass index is 23.78 kg/m.    ECOG FS:0 - Asymptomatic  General: Well-developed, well-nourished, no acute distress. Eyes: Pink conjunctiva, anicteric sclera. HEENT: Normocephalic, moist mucous membranes. Breast: Right chest wall with well-healing surgical scar. Lungs: Clear to auscultation bilaterally. Heart: Regular rate and rhythm. No rubs, murmurs, or gallops. Abdomen: Soft, nontender, nondistended. No organomegaly noted, normoactive bowel sounds. Musculoskeletal: No edema, cyanosis, or clubbing. Neuro: Alert, answering all questions appropriately. Cranial nerves grossly intact. Skin: No rashes or petechiae noted. Psych: Normal affect.  LAB RESULTS:  Lab Results  Component Value Date   NA 137 04/03/2019  K 4.7 04/03/2019   CL 105 04/03/2019   CO2 24 04/03/2019   GLUCOSE 165 (H) 04/03/2019   BUN 12 04/03/2019   CREATININE 0.59 04/03/2019   CALCIUM 8.9 04/03/2019   PROT 7.3 09/19/2015   ALBUMIN 4.4 09/19/2015   AST 25 09/19/2015   ALT 16 09/19/2015   ALKPHOS 67 09/19/2015   BILITOT 0.9 09/19/2015   GFRNONAA >60 04/03/2019   GFRAA >60 04/03/2019    Lab Results  Component Value Date   WBC 13.3 (H) 04/03/2019   NEUTROABS 4.9 09/19/2015   HGB 12.2 04/03/2019   HCT 37.2 04/03/2019   MCV 95.4 04/03/2019   PLT 160 04/03/2019     STUDIES: Nm Sentinel Node Injection  Result Date: 04/02/2019 CLINICAL DATA:  Right sided breast cancer. EXAM: NUCLEAR MEDICINE BREAST LYMPHOSCINTIGRAPHY TECHNIQUE: Intradermal injection of radiopharmaceutical was performed at the 12 o'clock, 3 o'clock, 6 o'clock, and 9 o'clock positions around the right nipple. The patient was then sent to the operating room where the sentinel node(s) were identified and removed by the surgeon. RADIOPHARMACEUTICALS:  Total of 0.77 mCi Millipore-filtered  Technetium-63m sulfur colloid, injected in four equal aliquots. IMPRESSION: Uncomplicated intradermal injection of a total of 0.77 mCi Technetium-38m sulfur colloid for purposes of sentinel node identification. Electronically Signed   By: Sandi Mariscal M.D.   On: 04/02/2019 10:35    ASSESSMENT: Recurrent right breast DCIS.  PLAN:    1. DCIS: Patient was diagnosed with DCIS in her left breast is approximately 2013 and underwent mastectomy, but did not take adjuvant hormonal therapy.  Previously, patient underwent lumpectomy for her right breast DCIS but was noted to have focally positive margins.  She declined reexcision at that time and proceeded with adjuvant XRT.  She is now noted to have recurrent right breast DCIS without invasive component and underwent simple mastectomy on April 02, 2019.  Patient has discontinued tamoxifen and will initiate letrozole for an additional 5 years completing in October 2025.  Patient now has had bilateral mastectomy and does not require further mammograms.  Return to clinic in 3 months for routine evaluation. 2.  Osteoporosis: Patient had a bone mineral density on February 16, 2019 which reported a T score of -2.5.  By report this has improved since July 2018.  Patient is currently not receiving treatment with Fosamax, but can consider in the near future.  I recommended calcium and vitamin D supplementation.  Continue follow-up with endocrinology as scheduled. 3. Anticoagulation: Continue Coumadin.  Patient states her primary care physician has given her instructions on how to handle her anticoagulation surrounding her surgery.  Patient states that she cannot take Xarelto or Eliquis. Continue INR checks per primary care. 4. Risk of endometrial cancer: Continue with annual or biannual exams at the discretion of her primary care physician.    I spent a total of 30 minutes face-to-face with the patient of which greater than 50% of the visit was spent in counseling and  coordination of care as detailed above.   Patient expressed understanding and was in agreement with this plan. She also understands that She can call clinic at any time with any questions, concerns, or complaints.    Ductal carcinoma in situ (DCIS) of right breast   Staging form: Breast, AJCC 7th Edition     Clinical stage from 10/16/2015: Stage 0 (Tis (DCIS), N0, M0) - Signed by Lloyd Huger, MD on 10/16/2015   Lloyd Huger, MD   04/11/2019 12:12 PM

## 2019-04-08 ENCOUNTER — Other Ambulatory Visit: Payer: Self-pay

## 2019-04-08 ENCOUNTER — Encounter: Payer: Self-pay | Admitting: Oncology

## 2019-04-08 NOTE — Progress Notes (Signed)
Patient pre screened for office appointment, no questions or concerns today. Patient had right mastectomy last week.

## 2019-04-09 ENCOUNTER — Inpatient Hospital Stay: Payer: Medicare HMO | Attending: Oncology | Admitting: Oncology

## 2019-04-09 ENCOUNTER — Other Ambulatory Visit: Payer: Self-pay

## 2019-04-09 VITALS — BP 130/66 | HR 87 | Temp 97.5°F | Wt 130.0 lb

## 2019-04-09 DIAGNOSIS — Z79899 Other long term (current) drug therapy: Secondary | ICD-10-CM | POA: Diagnosis not present

## 2019-04-09 DIAGNOSIS — Z7901 Long term (current) use of anticoagulants: Secondary | ICD-10-CM | POA: Insufficient documentation

## 2019-04-09 DIAGNOSIS — E785 Hyperlipidemia, unspecified: Secondary | ICD-10-CM | POA: Insufficient documentation

## 2019-04-09 DIAGNOSIS — Z9011 Acquired absence of right breast and nipple: Secondary | ICD-10-CM | POA: Insufficient documentation

## 2019-04-09 DIAGNOSIS — M810 Age-related osteoporosis without current pathological fracture: Secondary | ICD-10-CM | POA: Diagnosis not present

## 2019-04-09 DIAGNOSIS — Z87891 Personal history of nicotine dependence: Secondary | ICD-10-CM | POA: Diagnosis not present

## 2019-04-09 DIAGNOSIS — Z170 Estrogen receptor positive status [ER+]: Secondary | ICD-10-CM | POA: Insufficient documentation

## 2019-04-09 DIAGNOSIS — I482 Chronic atrial fibrillation, unspecified: Secondary | ICD-10-CM | POA: Diagnosis not present

## 2019-04-09 DIAGNOSIS — D0511 Intraductal carcinoma in situ of right breast: Secondary | ICD-10-CM | POA: Diagnosis not present

## 2019-04-09 DIAGNOSIS — I10 Essential (primary) hypertension: Secondary | ICD-10-CM | POA: Diagnosis not present

## 2019-04-09 DIAGNOSIS — Z79811 Long term (current) use of aromatase inhibitors: Secondary | ICD-10-CM | POA: Insufficient documentation

## 2019-04-09 MED ORDER — LETROZOLE 2.5 MG PO TABS: 2.5000 mg | ORAL_TABLET | Freq: Every day | ORAL | 3 refills | Status: DC

## 2019-04-10 ENCOUNTER — Ambulatory Visit: Payer: Medicare HMO | Admitting: Oncology

## 2019-04-10 LAB — SURGICAL PATHOLOGY

## 2019-05-18 ENCOUNTER — Ambulatory Visit: Payer: Medicare HMO | Admitting: Oncology

## 2019-07-11 NOTE — Progress Notes (Signed)
[place]  Telephone:(336) [phone] Fax:(336) [phone]  ID: Sierra Green OB: [birth year]  MR#: [id number]  [id number]  Patient Care Team: Ezequiel Kayser, MD as PCP - General (Internal Medicine)    CHIEF COMPLAINT: Recurrent right breast DCIS, status post simple mastectomy.  INTERVAL HISTORY: Patient returns to clinic today for routine 64-month evaluation.  She discontinued letrozole approximately 3 months ago stating it made her highly anxious and she felt "terrible".  Since discontinuing treatment she has felt well and is back to her baseline.  She has no neurologic complaints. She denies any recent fevers or illnesses. She has a good appetite and denies weight loss.  She denies any chest pain, shortness of breath, cough, or hemoptysis.  She denies any nausea, vomiting, constipation, or diarrhea. She has no urinary complaints.  Patient offers no specific complaints today.  REVIEW OF SYSTEMS:   Review of Systems  Constitutional: Negative.  Negative for fever, malaise/fatigue and weight loss.  Respiratory: Negative.  Negative for cough and shortness of breath.   Cardiovascular: Negative.  Negative for chest pain and leg swelling.  Gastrointestinal: Negative.  Negative for abdominal pain.  Genitourinary: Negative.  Negative for dysuria.  Musculoskeletal: Negative.  Negative for back pain.  Skin: Negative.  Negative for rash.  Neurological: Negative.  Negative for sensory change, focal weakness, weakness and headaches.  Endo/Heme/Allergies: Does not bruise/bleed easily.  Psychiatric/Behavioral: Negative.  The patient is not nervous/anxious.    As per HPI. Otherwise, a complete review of systems is negative.   PAST MEDICAL HISTORY: Past Medical History:  Diagnosis Date  . Allergy    Seasonal  . Back pain   . Cancer [facility]) 2013   left breast  . Chronic atrial fibrillation ([place])    s/p 3 ablations, multiple cardioversions  . Dysrhythmia 1998   A-fib  .  Hyperlipidemia   . Hypertension   . Mitral valve regurgitation   . Osteoporosis   . Personal history of radiation therapy 2017   RIGHT lumpectomy  . Sensorineural hearing loss (SNHL) of both ears    worst on right  . Vitamin D deficiency     PAST SURGICAL HISTORY: Past Surgical History:  Procedure Laterality Date  . AUGMENTATION MAMMAPLASTY Bilateral 10/2011  . BREAST BIOPSY Right 08/2015   US guided biopsy - DCIS  . BREAST BIOPSY Left 2013   [place]  . BREAST BIOPSY Right 02/24/2019   Alberteen Sam, pending path  . BREAST IMPLANT REMOVAL Right 04/02/2019   Procedure: REMOVAL BREAST IMPLANTS;  Surgeon: Benjamine Sprague, DO;  Location: [facility] ORS;  Service: General;  Laterality: Right;  . BREAST LUMPECTOMY Right 2017  . BREAST LUMPECTOMY WITH NEEDLE LOCALIZATION Right 09/27/2015   Procedure: BREAST LUMPECTOMY WITH NEEDLE LOCALIZATION;  Surgeon: Hubbard Robinson, MD;  Location: [facility] ORS;  Service: General;  Laterality: Right;  . CATARACT EXTRACTION Bilateral Right- 05/2014; Left-06/2014  . COLONOSCOPY WITH PROPOFOL N/A 07/26/2017   Procedure: COLONOSCOPY WITH PROPOFOL;  Surgeon: [name], MD;  Location: [facility] ENDOSCOPY;  Service: Endoscopy;  Laterality: N/A;  . heart ablation     1 at [facility] and 2 at [facility]  . MASTECTOMY Left 07/2011   DCIS- [place], [place]  . PROXIMAL INTERPHALANGEAL FUSION (PIP) Right 05/15/2013   2nd, 3rd, and 5th digits  . SENTINEL NODE BIOPSY Right 09/27/2015   Procedure: SENTINEL NODE BIOPSY;  Surgeon: Hubbard Robinson, MD;  Location: [facility] ORS;  Service: General;  Laterality: Right;  .  SIMPLE MASTECTOMY WITH AXILLARY SENTINEL NODE BIOPSY Right 04/02/2019   Procedure: Right simple mastectomy and implant removal;  Surgeon: Benjamine Sprague, DO;  Location: [facility] ORS;  Service: General;  Laterality: Right;  . TONSILLECTOMY  as child  . TUBAL LIGATION Bilateral 1976   Laparoscopic    FAMILY HISTORY Family History   Problem Relation Age of Onset  . Breast cancer Neg Hx        ADVANCED DIRECTIVES:    HEALTH MAINTENANCE: Social History   Tobacco Use  . Smoking status: Former Smoker    Quit date: 07/02/1966    Years since quitting: 53.0  . Smokeless tobacco: Never Used  Substance Use Topics  . Alcohol use: Yes    Alcohol/week: 4.0 - 5.0 standard drinks    Types: 4 - 5 Glasses of wine per week  . Drug use: No     Colonoscopy:  PAP:  Bone density:  Lipid panel:  Allergies  Allergen Reactions  . Oxycodone Anaphylaxis  . Zoledronic Acid Other (See Comments)    Iritis  . Boniva [Ibandronic Acid] Other (See Comments)    Flu like symptoms  . Alendronate Other (See Comments)    Flu like symptoms  . Honey Bee Venom Swelling    Marked local swelling    Current Outpatient Medications  Medication Sig Dispense Refill  . Ascorbic Acid (VITAMIN C) 1000 MG tablet Take 1,000 mg by mouth 4 (four) times a week. [place] Reek, [place], and Sat    . Cholecalciferol (VITAMIN D3) 5000 units TABS Take 5,000 Units by mouth every Monday, Tuesday, Wednesday, Thursday, and Friday.     . diltiazem (TAZTIA XT) 120 MG 24 hr capsule Take 120 mg by mouth 2 (two) times daily.     . fexofenadine (ALLEGRA) 180 MG tablet Take 180 mg by mouth daily as needed for allergies.     . Melatonin 1 MG CAPS Take 1 mg by mouth at bedtime.    . Red Yeast Rice Extract 600 MG CAPS Take 600 mg by mouth at bedtime.     Marland Kitchen warfarin (COUMADIN) 5 MG tablet Take 2.5-5 mg by mouth See admin instructions. Take 2.5 mg at bedtime on Mon and Fri. Take 5 mg at bedtime on Sun, Tue, Wed, Thur, and Sat    . letrozole (FEMARA) 2.5 MG tablet Take 1 tablet (2.5 mg total) by mouth daily. (Patient not taking: Reported on 07/13/2019) 90 tablet 3   No current facility-administered medications for this visit.    OBJECTIVE: Vitals:   07/14/19 1026  BP: 122/82  Pulse: 82  Resp: 18     Body mass index is 24.33 kg/m.    ECOG FS:0 - Asymptomatic  General:  Well-developed, well-nourished, no acute distress. Eyes: Pink conjunctiva, anicteric sclera. HEENT: Normocephalic, moist mucous membranes. Breast: Bilateral mastectomy.  Exam deferred today. Lungs: No audible wheezing or coughing. Heart: Regular rate and rhythm. Abdomen: Soft, nontender, no obvious distention. Musculoskeletal: No edema, cyanosis, or clubbing. Neuro: Alert, answering all questions appropriately. Cranial nerves grossly intact. Skin: No rashes or petechiae noted. Psych: Normal affect.   LAB RESULTS:  Lab Results  Component Value Date   NA 137 04/03/2019   K 4.7 04/03/2019   CL 105 04/03/2019   CO2 24 04/03/2019   GLUCOSE 165 (H) 04/03/2019   BUN 12 04/03/2019   CREATININE 0.59 04/03/2019   CALCIUM 8.9 04/03/2019   PROT 7.3 09/19/2015   ALBUMIN 4.4 09/19/2015   AST 25 09/19/2015   ALT 16  09/19/2015   ALKPHOS 67 09/19/2015   BILITOT 0.9 09/19/2015   GFRNONAA >60 04/03/2019   GFRAA >60 04/03/2019    Lab Results  Component Value Date   WBC 13.3 (H) 04/03/2019   NEUTROABS 4.9 09/19/2015   HGB 12.2 04/03/2019   HCT 37.2 04/03/2019   MCV 95.4 04/03/2019   PLT 160 04/03/2019     STUDIES: No results found.  ASSESSMENT: Recurrent right breast DCIS.  PLAN:    1. DCIS: Patient was diagnosed with DCIS in her left breast is approximately 2013 and underwent mastectomy, but did not take adjuvant hormonal therapy.  Previously, patient underwent lumpectomy for her right breast DCIS but was noted to have focally positive margins.  She declined reexcision at that time and proceeded with adjuvant XRT.  She is now noted to have recurrent right breast DCIS without invasive component and underwent simple mastectomy on April 02, 2019.  Patient could not tolerate letrozole and does not wish to reinitiate tamoxifen or try another aromatase inhibitor.  She acknowledged that despite having a mastectomy, her risk of recurrence has slightly increased.  No further intervention is  needed.  Patient has had bilateral mastectomy therefore does not require additional mammograms.  Return to clinic in 1 year for routine evaluation.  2.  Osteoporosis: Patient had a bone mineral density on February 16, 2019 which reported a T score of -2.5.  By report this has improved since July 2018.  Continue follow-up with endocrinology and primary care as scheduled.  Continue calcium and vitamin D supplementation. 3. Anticoagulation: Continue Coumadin. Patient states that she cannot take Xarelto or Eliquis. Continue INR checks per primary care. 4. Risk of endometrial cancer: Continue with annual or biannual exams at the discretion of her primary care physician.    I spent a total of 15 minutes face-to-face with the patient and reviewing chart data of which greater than 50% of the visit was spent in counseling and coordination of care as detailed above.  Patient expressed understanding and was in agreement with this plan. She also understands that She can call clinic at any time with any questions, concerns, or complaints.    Ductal carcinoma in situ (DCIS) of right breast   Staging form: Breast, AJCC 7th Edition     Clinical stage from 10/16/2015: Stage 0 (Tis (DCIS), N0, M0) - Signed by Lloyd Huger, MD on 10/16/2015   Lloyd Huger, MD   07/14/2019 3:08 PM

## 2019-07-13 ENCOUNTER — Other Ambulatory Visit: Payer: Self-pay

## 2019-07-13 NOTE — Progress Notes (Signed)
Patient pre screened for office appointment, no questions or concerns today. Patient reminded of upcoming appointment time and date. 

## 2019-07-14 ENCOUNTER — Other Ambulatory Visit: Payer: Self-pay

## 2019-07-14 ENCOUNTER — Inpatient Hospital Stay: Payer: Medicare HMO | Attending: Oncology | Admitting: Oncology

## 2019-07-14 VITALS — BP 122/82 | HR 82 | Resp 18 | Wt 133.0 lb

## 2019-07-14 DIAGNOSIS — D0511 Intraductal carcinoma in situ of right breast: Secondary | ICD-10-CM | POA: Diagnosis not present

## 2019-07-14 DIAGNOSIS — Z87891 Personal history of nicotine dependence: Secondary | ICD-10-CM | POA: Diagnosis not present

## 2019-07-14 DIAGNOSIS — Z7901 Long term (current) use of anticoagulants: Secondary | ICD-10-CM | POA: Insufficient documentation

## 2019-07-14 DIAGNOSIS — E785 Hyperlipidemia, unspecified: Secondary | ICD-10-CM | POA: Insufficient documentation

## 2019-07-14 DIAGNOSIS — M810 Age-related osteoporosis without current pathological fracture: Secondary | ICD-10-CM | POA: Diagnosis not present

## 2019-07-14 DIAGNOSIS — I482 Chronic atrial fibrillation, unspecified: Secondary | ICD-10-CM | POA: Insufficient documentation

## 2019-07-14 DIAGNOSIS — I10 Essential (primary) hypertension: Secondary | ICD-10-CM | POA: Diagnosis not present

## 2019-07-14 DIAGNOSIS — Z923 Personal history of irradiation: Secondary | ICD-10-CM | POA: Diagnosis not present

## 2019-07-14 DIAGNOSIS — Z9013 Acquired absence of bilateral breasts and nipples: Secondary | ICD-10-CM | POA: Insufficient documentation

## 2019-07-14 DIAGNOSIS — Z79899 Other long term (current) drug therapy: Secondary | ICD-10-CM | POA: Insufficient documentation

## 2019-07-14 DIAGNOSIS — Z9223 Personal history of estrogen therapy: Secondary | ICD-10-CM | POA: Insufficient documentation

## 2019-10-22 ENCOUNTER — Ambulatory Visit: Payer: Medicare HMO | Admitting: Dermatology

## 2019-10-22 ENCOUNTER — Other Ambulatory Visit: Payer: Self-pay

## 2019-10-22 DIAGNOSIS — D485 Neoplasm of uncertain behavior of skin: Secondary | ICD-10-CM

## 2019-10-22 NOTE — Patient Instructions (Addendum)

## 2019-10-22 NOTE — Progress Notes (Signed)
   Follow-Up Visit   Subjective  Sierra Green is a 79 y.o. female who presents for the following: Follow-up.  Patient here today for follow up from February and a biopsy of right lower lip. Spot has been growing.   The following portions of the chart were reviewed this encounter and updated as appropriate:  Tobacco  Allergies  Meds  Problems  Med Hx  Surg Hx  Fam Hx      Review of Systems:  No other skin or systemic complaints except as noted in HPI or Assessment and Plan.  Objective  Well appearing patient in no apparent distress; mood and affect are within normal limits.  A focused examination was performed including face. Relevant physical exam findings are noted in the Assessment and Plan.  Objective  Right [place] Border Lip: 0.3 red papule      Assessment & Plan  Neoplasm of uncertain behavior of skin Right [place] Border Lip  Skin / nail biopsy Type of biopsy: tangential   Informed consent: discussed and consent obtained   Timeout: patient name, date of birth, surgical site, and procedure verified   Patient was prepped and draped in usual sterile fashion: Area prepped with isopropyl alcohol. Anesthesia: the lesion was anesthetized in a standard fashion   Anesthetic:  1% lidocaine w/ epinephrine 1-100,000 buffered w/ 8.4% NaHCO3 Instrument used: flexible razor blade   Hemostasis achieved with: aluminum chloride and electrodesiccation   Outcome: patient tolerated procedure well   Post-procedure details: wound care instructions given   Additional details:  Mupirocin and a bandage applied  Specimen 1 - Surgical pathology Differential Diagnosis: r/o Hemangioma vs Other Check Margins: No 0.3 red papule  Return for as scheduled, TBSE.  Graciella Belton, RMA, am acting as scribe for Forest Gleason, MD .   Documentation: I have reviewed the above documentation for accuracy and completeness, and I agree with the above.  Forest Gleason,  MD

## 2019-11-03 ENCOUNTER — Other Ambulatory Visit: Payer: Self-pay | Admitting: Gastroenterology

## 2019-11-03 ENCOUNTER — Telehealth: Payer: Self-pay

## 2019-11-03 DIAGNOSIS — R194 Change in bowel habit: Secondary | ICD-10-CM

## 2019-11-03 DIAGNOSIS — R634 Abnormal weight loss: Secondary | ICD-10-CM

## 2019-11-03 NOTE — Telephone Encounter (Signed)
Patient advised biopsy benign hemangioma.

## 2019-11-07 ENCOUNTER — Encounter: Payer: Self-pay | Admitting: Dermatology

## 2019-11-10 ENCOUNTER — Ambulatory Visit
Admission: RE | Admit: 2019-11-10 | Discharge: 2019-11-10 | Disposition: A | Discharge status: Home / Self Care | Payer: Medicare HMO | Source: Ambulatory Visit | Attending: Gastroenterology | Admitting: Gastroenterology

## 2019-11-10 ENCOUNTER — Other Ambulatory Visit: Payer: Self-pay

## 2019-11-10 DIAGNOSIS — R194 Change in bowel habit: Secondary | ICD-10-CM | POA: Insufficient documentation

## 2019-11-10 DIAGNOSIS — R634 Abnormal weight loss: Secondary | ICD-10-CM | POA: Diagnosis not present

## 2019-11-10 MED ORDER — IOHEXOL 300 MG/ML  SOLN
100.0000 mL | Freq: Once | INTRAMUSCULAR | Status: AC | PRN
  Administered 2019-11-10: 85 mL via INTRAVENOUS

## 2019-12-18 ENCOUNTER — Other Ambulatory Visit: Payer: Self-pay | Admitting: Gastroenterology

## 2019-12-18 DIAGNOSIS — K769 Liver disease, unspecified: Secondary | ICD-10-CM

## 2020-01-05 ENCOUNTER — Other Ambulatory Visit: Payer: Self-pay

## 2020-01-05 ENCOUNTER — Ambulatory Visit
Admission: RE | Admit: 2020-01-05 | Discharge: 2020-01-05 | Disposition: A | Discharge status: Home / Self Care | Payer: Medicare HMO | Source: Ambulatory Visit | Attending: Gastroenterology | Admitting: Gastroenterology

## 2020-01-05 DIAGNOSIS — K769 Liver disease, unspecified: Secondary | ICD-10-CM | POA: Insufficient documentation

## 2020-01-05 MED ORDER — GADOBUTROL 1 MMOL/ML IV SOLN
6.0000 mL | Freq: Once | INTRAVENOUS | Status: AC | PRN
  Administered 2020-01-05: 6 mL via INTRAVENOUS

## 2020-01-27 ENCOUNTER — Other Ambulatory Visit: Payer: Self-pay

## 2020-01-27 ENCOUNTER — Other Ambulatory Visit
Admission: RE | Admit: 2020-01-27 | Discharge: 2020-01-27 | Disposition: A | Discharge status: Home / Self Care | Payer: Medicare HMO | Source: Ambulatory Visit | Attending: Gastroenterology | Admitting: Gastroenterology

## 2020-01-27 DIAGNOSIS — Z01812 Encounter for preprocedural laboratory examination: Secondary | ICD-10-CM | POA: Insufficient documentation

## 2020-01-27 DIAGNOSIS — Z20822 Contact with and (suspected) exposure to covid-19: Secondary | ICD-10-CM | POA: Diagnosis not present

## 2020-01-27 LAB — SARS CORONAVIRUS 2 (TAT 6-24 HRS): SARS Coronavirus 2: NEGATIVE

## 2020-01-29 ENCOUNTER — Ambulatory Visit: Payer: Medicare HMO | Admitting: Anesthesiology

## 2020-01-29 ENCOUNTER — Ambulatory Visit
Admission: RE | Admit: 2020-01-29 | Discharge: 2020-01-29 | Disposition: A | Discharge status: Home / Self Care | Payer: Medicare HMO | Attending: Gastroenterology | Admitting: Gastroenterology

## 2020-01-29 ENCOUNTER — Encounter
Admission: RE | Disposition: A | Discharge status: Home / Self Care | Payer: Self-pay | Source: Home / Self Care | Attending: Gastroenterology

## 2020-01-29 ENCOUNTER — Encounter: Payer: Self-pay | Admitting: Internal Medicine

## 2020-01-29 DIAGNOSIS — I482 Chronic atrial fibrillation, unspecified: Secondary | ICD-10-CM | POA: Insufficient documentation

## 2020-01-29 DIAGNOSIS — M810 Age-related osteoporosis without current pathological fracture: Secondary | ICD-10-CM | POA: Diagnosis not present

## 2020-01-29 DIAGNOSIS — D122 Benign neoplasm of ascending colon: Secondary | ICD-10-CM | POA: Diagnosis not present

## 2020-01-29 DIAGNOSIS — I10 Essential (primary) hypertension: Secondary | ICD-10-CM | POA: Insufficient documentation

## 2020-01-29 DIAGNOSIS — Z79899 Other long term (current) drug therapy: Secondary | ICD-10-CM | POA: Diagnosis not present

## 2020-01-29 DIAGNOSIS — Z87891 Personal history of nicotine dependence: Secondary | ICD-10-CM | POA: Insufficient documentation

## 2020-01-29 DIAGNOSIS — E785 Hyperlipidemia, unspecified: Secondary | ICD-10-CM | POA: Diagnosis not present

## 2020-01-29 DIAGNOSIS — R197 Diarrhea, unspecified: Secondary | ICD-10-CM | POA: Insufficient documentation

## 2020-01-29 DIAGNOSIS — R634 Abnormal weight loss: Secondary | ICD-10-CM | POA: Diagnosis not present

## 2020-01-29 DIAGNOSIS — K648 Other hemorrhoids: Secondary | ICD-10-CM | POA: Insufficient documentation

## 2020-01-29 DIAGNOSIS — B9681 Helicobacter pylori [H. pylori] as the cause of diseases classified elsewhere: Secondary | ICD-10-CM | POA: Diagnosis not present

## 2020-01-29 DIAGNOSIS — Z7901 Long term (current) use of anticoagulants: Secondary | ICD-10-CM | POA: Diagnosis not present

## 2020-01-29 DIAGNOSIS — K2970 Gastritis, unspecified, without bleeding: Secondary | ICD-10-CM | POA: Diagnosis not present

## 2020-01-29 DIAGNOSIS — K5730 Diverticulosis of large intestine without perforation or abscess without bleeding: Secondary | ICD-10-CM | POA: Insufficient documentation

## 2020-01-29 DIAGNOSIS — E559 Vitamin D deficiency, unspecified: Secondary | ICD-10-CM | POA: Diagnosis not present

## 2020-01-29 HISTORY — PX: ESOPHAGOGASTRODUODENOSCOPY (EGD) WITH PROPOFOL: SHX5813

## 2020-01-29 HISTORY — PX: COLONOSCOPY WITH PROPOFOL: SHX5780

## 2020-01-29 SURGERY — COLONOSCOPY WITH PROPOFOL
Anesthesia: General

## 2020-01-29 MED ORDER — FENTANYL CITRATE (PF) 100 MCG/2ML IJ SOLN
INTRAMUSCULAR | Status: AC
  Filled 2020-01-29: qty 2

## 2020-01-29 MED ORDER — PROPOFOL 500 MG/50ML IV EMUL
INTRAVENOUS | Status: DC | PRN
  Administered 2020-01-29: 50 ug/kg/min via INTRAVENOUS

## 2020-01-29 MED ORDER — SODIUM CHLORIDE 0.9 % IV SOLN: INTRAVENOUS | Status: DC

## 2020-01-29 MED ORDER — MIDAZOLAM HCL 2 MG/2ML IJ SOLN
INTRAMUSCULAR | Status: AC
  Filled 2020-01-29: qty 2

## 2020-01-29 MED ORDER — METOPROLOL TARTRATE 5 MG/5ML IV SOLN
INTRAVENOUS | Status: DC | PRN
Start: 2020-01-29 — End: 2020-01-29
  Administered 2020-01-29: 1 mg via INTRAVENOUS

## 2020-01-29 MED ORDER — LIDOCAINE HCL (PF) 2 % IJ SOLN
INTRAMUSCULAR | Status: DC | PRN
  Administered 2020-01-29: 50 mg

## 2020-01-29 MED ORDER — PROPOFOL 10 MG/ML IV BOLUS
INTRAVENOUS | Status: DC | PRN
  Administered 2020-01-29 (×2): 10 mg via INTRAVENOUS
  Administered 2020-01-29 (×4): 20 mg via INTRAVENOUS

## 2020-01-29 MED ORDER — PHENYLEPHRINE HCL (PRESSORS) 10 MG/ML IV SOLN
INTRAVENOUS | Status: DC | PRN
Start: 2020-01-29 — End: 2020-01-29
  Administered 2020-01-29 (×2): 100 ug via INTRAVENOUS

## 2020-01-29 MED ORDER — METOPROLOL TARTRATE 5 MG/5ML IV SOLN
INTRAVENOUS | Status: AC
  Filled 2020-01-29: qty 5

## 2020-01-29 MED ORDER — FENTANYL CITRATE (PF) 100 MCG/2ML IJ SOLN
INTRAMUSCULAR | Status: DC | PRN
  Administered 2020-01-29: 25 ug via INTRAVENOUS

## 2020-01-29 MED ORDER — LIDOCAINE HCL (PF) 2 % IJ SOLN
INTRAMUSCULAR | Status: AC
  Filled 2020-01-29: qty 5

## 2020-01-29 MED ORDER — MIDAZOLAM HCL 5 MG/5ML IJ SOLN
INTRAMUSCULAR | Status: DC | PRN
  Administered 2020-01-29: 2 mg via INTRAVENOUS

## 2020-01-29 NOTE — Anesthesia Preprocedure Evaluation (Addendum)
Anesthesia Evaluation  Patient identified by MRN, date of birth, ID band Patient awake    Reviewed: Allergy & Precautions, H&P , NPO status , Patient's Chart, lab work & pertinent test results  Airway Mallampati: II  TM Distance: >3 FB Neck ROM: full    Dental  (+) Upper Dentures, Partial Lower   Pulmonary former smoker,    breath sounds clear to auscultation       Cardiovascular hypertension, + dysrhythmias Atrial Fibrillation  Rhythm:irregular     Neuro/Psych negative neurological ROS  negative psych ROS   GI/Hepatic negative GI ROS, Neg liver ROS,   Endo/Other  negative endocrine ROS  Renal/GU negative Renal ROS  negative genitourinary   Musculoskeletal   Abdominal   Peds  Hematology negative hematology ROS (+)   Anesthesia Other Findings Past Medical History: No date: Allergy     Comment:  Seasonal No date: Back pain 2013: 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 No date: Chronic atrial fibrillation (HCC)     Comment:  s/p 3 ablations, multiple cardioversions 1998: Dysrhythmia     Comment:  A-fib No date: Hyperlipidemia No date: Hypertension No date: Mitral valve regurgitation No date: Osteoporosis 2017: Personal history of radiation therapy     Comment:  RIGHT lumpectomy No date: Sensorineural hearing loss (SNHL) of both ears     Comment:  worst on right No date: Vitamin D deficiency  Past Surgical History: 10/2011: AUGMENTATION MAMMAPLASTY; Bilateral 08/2015: BREAST BIOPSY; Right     Comment:  US guided biopsy - [facility] 2013: BREAST BIOPSY; Left     Comment:  Stereotactic biopsy - [place] 02/24/2019: BREAST BIOPSY; Right     Comment:  Alberteen Sam, pending path 04/02/2019: BREAST IMPLANT REMOVAL; Right     Comment:  Procedure: REMOVAL BREAST IMPLANTS;  Surgeon: Benjamine Sprague, DO;  Location: [facility] ORS;  Service: General;                Laterality: Right; 2017: BREAST LUMPECTOMY;  Right 09/27/2015: BREAST LUMPECTOMY WITH NEEDLE LOCALIZATION; Right     Comment:  Procedure: BREAST LUMPECTOMY WITH NEEDLE LOCALIZATION;                Surgeon: Hubbard Robinson, MD;  Location: [facility] ORS;                Service: General;  Laterality: Right; Right- 05/2014; Left-06/2014: CATARACT EXTRACTION; Bilateral 07/26/2017: COLONOSCOPY WITH PROPOFOL; N/A     Comment:  Procedure: COLONOSCOPY WITH PROPOFOL;  Surgeon:               [name], MD;  Location: [facility] ENDOSCOPY;                Service: Endoscopy;  Laterality: N/A; No date: heart ablation     Comment:  1 at [facility] and 2 at [facility] 07/2011: MASTECTOMY; 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[place] 05/15/2013: PROXIMAL INTERPHALANGEAL FUSION (PIP); Right     Comment:  2nd, 3rd, and 5th digits 09/27/2015: SENTINEL NODE BIOPSY; Right     Comment:  Procedure: SENTINEL NODE BIOPSY;  Surgeon: Hubbard Robinson, MD;  Location: [facility] ORS;  Service: General;                Laterality: Right; 04/02/2019: [place]  NODE BIOPSY; Right     Comment:  Procedure: Right simple mastectomy and implant removal;               Surgeon: Benjamine Sprague, DO;  Location: [facility] ORS;  Service:              General;  Laterality: Right; as child: TONSILLECTOMY 1976: TUBAL LIGATION; Bilateral     Comment:  Laparoscopic     Reproductive/Obstetrics negative OB ROS                            Anesthesia Physical Anesthesia Plan  ASA: II  Anesthesia Plan: General   Post-op Pain Management:    Induction:   PONV Risk Score and Plan: Propofol infusion and TIVA  Airway Management Planned: Nasal Cannula  Additional Equipment:   Intra-op Plan:   Post-operative Plan:   Informed Consent: I have reviewed the patients History and Physical, chart, labs and discussed the procedure including the risks, benefits and alternatives for the proposed anesthesia with the  patient or authorized representative who has indicated his/her understanding and acceptance.     Dental Advisory Given  Plan Discussed with: Anesthesiologist, CRNA and Surgeon  Anesthesia Plan Comments: (Felt lightheaded this morning, thinks she is dehydrated.)       Anesthesia Quick Evaluation

## 2020-01-29 NOTE — Op Note (Signed)
[facility] Gastroenterology Patient Name: Sierra Green Procedure Date: 01/29/2020 8:59 AM MRN: [id number] Account #: [id number] Date of Birth: 1940-07-07 Admit Type: Outpatient Age: 79 Room: [facility] ENDO ROOM 3 Gender: Female Note Status: Finalized Procedure:             Upper GI endoscopy Indications:           Diarrhea, Weight loss Providers:             Andrey Farmer MD, MD Referring MD:          Christena Flake. [name], MD (Referring MD) Medicines:             Monitored Anesthesia Care Complications:         No immediate complications. Estimated blood loss:                         Minimal. Procedure:             Pre-Anesthesia Assessment:                        - Prior to the procedure, a History and Physical was                         performed, and patient medications and allergies were                         reviewed. The patient is competent. The risks and                         benefits of the procedure and the sedation options and                         risks were discussed with the patient. All questions                         were answered and informed consent was obtained.                         Patient identification and proposed procedure were                         verified by the physician, the nurse, the anesthetist                         and the technician in the endoscopy suite. Mental                         Status Examination: alert and oriented. Airway                         Examination: normal oropharyngeal airway and neck                         mobility. Respiratory Examination: clear to                         auscultation. CV Examination: normal. Prophylactic  Antibiotics: The patient does not require prophylactic                         antibiotics. Prior Anticoagulants: The patient has                         taken Coumadin (warfarin), last dose was 4 days prior                         to procedure. ASA  Grade Assessment: II - A patient                         with mild systemic disease. After reviewing the risks                         and benefits, the patient was deemed in satisfactory                         condition to undergo the procedure. The anesthesia                         plan was to use monitored anesthesia care (MAC).                         Immediately prior to administration of medications,                         the patient was re-assessed for adequacy to receive                         sedatives. The heart rate, respiratory rate, oxygen                         saturations, blood pressure, adequacy of pulmonary                         ventilation, and response to care were monitored                         throughout the procedure. The physical status of the                         patient was re-assessed after the procedure.                        After obtaining informed consent, the endoscope was                         passed under direct vision. Throughout the procedure,                         the patient's blood pressure, pulse, and oxygen                         saturations were monitored continuously. The Endoscope                         was introduced through the mouth, and advanced  to the                         second part of duodenum. The upper GI endoscopy was                         accomplished without difficulty. The patient tolerated                         the procedure well. Findings:      The examined esophagus was normal.      Striped mildly erythematous mucosa without bleeding was found in the       gastric antrum. Biopsies were taken with a cold forceps for Helicobacter       pylori testing. Estimated blood loss was minimal.      The examined duodenum was normal. Biopsies for histology were taken with       a cold forceps for evaluation of celiac disease. Estimated blood loss       was minimal. Impression:            - Normal esophagus.                         - Erythematous mucosa in the antrum. Biopsied.                        - Normal examined duodenum. Biopsied. Recommendation:        - NPO.                        - Continue present medications.                        - Resume Coumadin (warfarin) at prior dose today.                         Refer to managing physician for further adjustment of                         therapy.                        - Await pathology results.                        - Perform a colonoscopy today. Procedure Code(s):     --- Professional ---                        [phone], Esophagogastroduodenoscopy, flexible,                         transoral; with biopsy, single or multiple Diagnosis Code(s):     --- Professional ---                        K31.89, Other diseases of stomach and duodenum                        R19.7, Diarrhea, unspecified                        R63.4, Abnormal weight loss  CPT copyright 2019 American Medical Association. All rights reserved. The codes documented in this report are preliminary and upon coder review may  be revised to meet current compliance requirements. Andrey Farmer, MD Andrey Farmer MD, MD 01/29/2020 9:50:16 AM Number of Addenda: 0 Note Initiated On: 01/29/2020 8:59 AM Estimated Blood Loss:  Estimated blood loss was minimal.      [facility]

## 2020-01-29 NOTE — Op Note (Signed)
[facility] - Inpatient Gastroenterology Patient Name: Sierra Green Procedure Date: 01/29/2020 8:58 AM MRN: [id number] Account #: [id number] Date of Birth: [birth year] Admit Type: Outpatient Age: 79 Room: [facility] ENDO ROOM 3 Gender: Female Note Status: Finalized Procedure:             Colonoscopy Indications:           Clinically significant diarrhea of unexplained origin,                         Personal history of colonic polyps, Weight loss Providers:             Andrey Farmer MD, MD Referring MD:          Christena Flake. [name], MD (Referring MD) Medicines:             Monitored Anesthesia Care Complications:         No immediate complications. Estimated blood loss:                         Minimal. Procedure:             Pre-Anesthesia Assessment:                        - Prior to the procedure, a History and Physical was                         performed, and patient medications and allergies were                         reviewed. The patient is competent. The risks and                         benefits of the procedure and the sedation options and                         risks were discussed with the patient. All questions                         were answered and informed consent was obtained.                         Patient identification and proposed procedure were                         verified by the physician, the nurse, the anesthetist                         and the technician in the endoscopy suite. Mental                         Status Examination: alert and oriented. Airway                         Examination: normal oropharyngeal airway and neck                         mobility. Respiratory Examination: clear to  auscultation. CV Examination: normal. Prophylactic                         Antibiotics: The patient does not require prophylactic                         antibiotics. Prior Anticoagulants: The patient has                          taken Coumadin (warfarin), last dose was 4 days prior                         to procedure. ASA Grade Assessment: II - A patient                         with mild systemic disease. After reviewing the risks                         and benefits, the patient was deemed in satisfactory                         condition to undergo the procedure. The anesthesia                         plan was to use monitored anesthesia care (MAC).                         Immediately prior to administration of medications,                         the patient was re-assessed for adequacy to receive                         sedatives. The heart rate, respiratory rate, oxygen                         saturations, blood pressure, adequacy of pulmonary                         ventilation, and response to care were monitored                         throughout the procedure. The physical status of the                         patient was re-assessed after the procedure.                        After obtaining informed consent, the colonoscope was                         passed under direct vision. Throughout the procedure,                         the patient's blood pressure, pulse, and oxygen                         saturations were monitored continuously. The  Colonoscope was introduced through the anus and                         advanced to the the terminal ileum. The colonoscopy                         was performed without difficulty. The patient                         tolerated the procedure well. The quality of the bowel                         preparation was good. Findings:      The perianal and digital rectal examinations were normal.      The terminal ileum appeared normal.      A 3 mm polyp was found in the ascending colon. The polyp was sessile.       The polyp was removed with a jumbo cold forceps. Resection and retrieval       were complete. Estimated blood loss was minimal.       A few small-mouthed diverticula were found in the sigmoid colon.      The colon (entire examined portion) appeared normal. Biopsies for       histology were taken with a cold forceps from the entire colon for       evaluation of microscopic colitis. Estimated blood loss was minimal.      Internal hemorrhoids were found during retroflexion. The hemorrhoids       were small.      The exam was otherwise without abnormality on direct and retroflexion       views. Impression:            - The examined portion of the ileum was normal.                        - One 3 mm polyp in the ascending colon, removed with                         a jumbo cold forceps. Resected and retrieved.                        - Diverticulosis in the sigmoid colon.                        - The entire examined colon is normal. Biopsied.                        - Internal hemorrhoids.                        - The examination was otherwise normal on direct and                         retroflexion views. Recommendation:        - Discharge patient to home.                        - Resume previous diet.                        -  Continue present medications.                        - Resume Coumadin (warfarin) at prior dose today.                         Refer to managing physician for further adjustment of                         therapy.                        - Await pathology results.                        - Repeat colonoscopy is recommended for surveillance.                         The colonoscopy date will be determined after                         pathology results from today's exam become available                         for review.                        - Return to referring physician as previously                         scheduled. Procedure Code(s):     --- Professional ---                        [phone], Colonoscopy, flexible; with biopsy, single or                         multiple Diagnosis Code(s):     ---  Professional ---                        K64.8, Other hemorrhoids                        K63.5, Polyp of colon                        R19.7, Diarrhea, unspecified                        Z86.010, Personal history of colonic polyps                        R63.4, Abnormal weight loss                        K57.30, Diverticulosis of large intestine without                         perforation or abscess without bleeding CPT copyright 2019 American Medical Association. All rights reserved. The codes documented in this report are preliminary and upon coder review may  be revised to meet current compliance requirements. Andrey Farmer, MD Andrey Farmer MD, MD 01/29/2020 9:54:48 AM Number of  Addenda: 0 Note Initiated On: 01/29/2020 8:58 AM Scope Withdrawal Time: 0 hours 15 minutes 27 seconds  Total Procedure Duration: 0 hours 29 minutes 7 seconds  Estimated Blood Loss:  Estimated blood loss was minimal.      [facility]

## 2020-01-29 NOTE — H&P (Signed)
Outpatient short stay form Pre-procedure 01/29/2020 8:51 AM Sierra Green, MPH  Primary Physician: Dr. Raechel Green  Reason for visit:  Weight Loss  History of present illness:   79 y/o lady with a.fib on coumadin with last dose on Monday. Here for weight loss/early satiety. Colon in 2019 with 6 TA's < 1 cm. No abdominal surgeries.    Current Facility-Administered Medications:  .  0.9 %  sodium chloride infusion, , Intravenous, Continuous, Kendell Sagraves, Hilton Cork, Green, Last Rate: 20 mL/hr at 01/29/20 0833, New Bag at 01/29/20 7106  Medications Prior to Admission  Medication Sig Dispense Refill Last Dose  . Ascorbic Acid (VITAMIN C) 1000 MG tablet Take 1,000 mg by mouth 4 (four) times a week. [place] Re[place], Thur, and Sat   Past Week at Unknown time  . Cholecalciferol (VITAMIN D3) 5000 units TABS Take 5,000 Units by mouth every Monday, Tuesday, Wednesday, Thursday, and Friday.    Past Week at Unknown time  . diltiazem (TAZTIA XT) 120 MG 24 hr capsule Take 120 mg by mouth 2 (two) times daily.    01/29/2020 at Unknown time  . Melatonin 1 MG CAPS Take 1 mg by mouth at bedtime.   Past Week at Unknown time  . Red Yeast Rice Extract 600 MG CAPS Take 600 mg by mouth at bedtime.    Past Week at Unknown time  . fexofenadine (ALLEGRA) 180 MG tablet Take 180 mg by mouth daily as needed for allergies.      Marland Kitchen letrozole (FEMARA) 2.5 MG tablet Take 1 tablet (2.5 mg total) by mouth daily. (Patient not taking: Reported on 07/13/2019) 90 tablet 3   . warfarin (COUMADIN) 5 MG tablet Take 2.5-5 mg by mouth See admin instructions. Take 2.5 mg at bedtime on Mon and Fri. Take 5 mg at bedtime on Sun, Tue, Wed, Thur, and Sat   01/25/2020     Allergies  Allergen Reactions  . Oxycodone Anaphylaxis  . Zoledronic Acid Other (See Comments)    Iritis  . Boniva [Ibandronic Acid] Other (See Comments)    Flu like symptoms  . Alendronate Other (See Comments)    Flu like symptoms  . Honey Bee Venom Swelling    Marked local  swelling     Past Medical History:  Diagnosis Date  . Allergy    Seasonal  . Back pain   . Cancer [facility]) 2013   left breast  . Chronic atrial fibrillation ([place])    s/p 3 ablations, multiple cardioversions  . Dysrhythmia 1998   A-fib  . Hyperlipidemia   . Hypertension   . Mitral valve regurgitation   . Osteoporosis   . Personal history of radiation therapy 2017   RIGHT lumpectomy  . Sensorineural hearing loss (SNHL) of both ears    worst on right  . Vitamin D deficiency     Review of systems:  Otherwise negative.    Physical Exam  Gen: Alert, oriented. Appears stated age.  HEENT: [place]/AT. PERRLA. Lungs: No respiratory distress Abd: soft, benign, no masses. BS+ Ext: No edema. Pulses 2+    Planned procedures: Proceed with EGD/colonoscopy. The patient understands the nature of the planned procedure, indications, risks, alternatives and potential complications including but not limited to bleeding, infection, perforation, damage to internal organs and possible oversedation/side effects from anesthesia. The patient agrees and gives consent to proceed.  Please refer to procedure notes for findings, recommendations and patient disposition/instructions.     Sierra Green, MPH Gastroenterology 01/29/2020  8:51 AM

## 2020-01-29 NOTE — Transfer of Care (Signed)
Immediate Anesthesia Transfer of Care Note  Patient: Sierra Green  Procedure(s) Performed: COLONOSCOPY WITH PROPOFOL (N/A ) ESOPHAGOGASTRODUODENOSCOPY (EGD) WITH PROPOFOL (N/A )  Patient Location: PACU  Anesthesia Type:General  Level of Consciousness: sedated  Airway & Oxygen Therapy: Patient Spontanous Breathing and Patient connected to nasal cannula oxygen  Post-op Assessment: Report given to RN and Post -op Vital signs reviewed and stable  Post vital signs: Reviewed and stable  Last Vitals:  Vitals Value Taken Time  BP 96/61 01/29/20 0949  Temp    Pulse 76 01/29/20 0950  Resp 18 01/29/20 0950  SpO2 97 % 01/29/20 0950  Vitals shown include unvalidated device data.  Last Pain:  Vitals:   01/29/20 0816  TempSrc: Temporal  PainSc: 0-No pain         Complications: No complications documented.

## 2020-01-29 NOTE — Interval H&P Note (Signed)
History and Physical Interval Note:  01/29/2020 8:56 AM  Sierra Green  has presented today for surgery, with the diagnosis of WT LOSS B H CHANGES.  The various methods of treatment have been discussed with the patient and family. After consideration of risks, benefits and other options for treatment, the patient has consented to  Procedure(s): COLONOSCOPY WITH PROPOFOL (N/A) ESOPHAGOGASTRODUODENOSCOPY (EGD) WITH PROPOFOL (N/A) as a surgical intervention.  The patient's history has been reviewed, patient examined, no change in status, stable for surgery.  I have reviewed the patient's chart and labs.  Questions were answered to the patient's satisfaction.     Lesly Rubenstein  Ok to proceed with EGD/Colonoscopy

## 2020-01-30 NOTE — Anesthesia Postprocedure Evaluation (Signed)
Anesthesia Post Note  Patient: Sierra Green  Procedure(s) Performed: COLONOSCOPY WITH PROPOFOL (N/A ) ESOPHAGOGASTRODUODENOSCOPY (EGD) WITH PROPOFOL (N/A )  Patient location during evaluation: PACU Anesthesia Type: General Level of consciousness: awake and alert Pain management: pain level controlled Vital Signs Assessment: post-procedure vital signs reviewed and stable Respiratory status: spontaneous breathing, nonlabored ventilation and respiratory function stable Cardiovascular status: blood pressure returned to baseline and stable Postop Assessment: no apparent nausea or vomiting Anesthetic complications: no   No complications documented.   Last Vitals:  Vitals:   01/29/20 0950 01/29/20 1000  BP: (!) 96/61 104/70  Pulse: 77 78  Resp: 20 16  Temp: 36.5 C   SpO2: 100% 100%    Last Pain:  Vitals:   01/30/20 0756  TempSrc:   PainSc: 0-No pain                 Tera Mater

## 2020-02-01 LAB — SURGICAL PATHOLOGY

## 2020-02-04 ENCOUNTER — Ambulatory Visit: Payer: Medicare HMO | Admitting: Radiation Oncology

## 2020-02-25 ENCOUNTER — Other Ambulatory Visit: Payer: Self-pay

## 2020-02-25 ENCOUNTER — Ambulatory Visit: Payer: Medicare HMO | Admitting: Dermatology

## 2020-02-25 DIAGNOSIS — L814 Other melanin hyperpigmentation: Secondary | ICD-10-CM

## 2020-02-25 DIAGNOSIS — Z1283 Encounter for screening for malignant neoplasm of skin: Secondary | ICD-10-CM

## 2020-02-25 DIAGNOSIS — Z85828 Personal history of other malignant neoplasm of skin: Secondary | ICD-10-CM

## 2020-02-25 DIAGNOSIS — D1800 Hemangioma unspecified site: Secondary | ICD-10-CM

## 2020-02-25 DIAGNOSIS — L821 Other seborrheic keratosis: Secondary | ICD-10-CM

## 2020-02-25 DIAGNOSIS — L578 Other skin changes due to chronic exposure to nonionizing radiation: Secondary | ICD-10-CM

## 2020-02-25 DIAGNOSIS — D229 Melanocytic nevi, unspecified: Secondary | ICD-10-CM

## 2020-02-25 NOTE — Patient Instructions (Addendum)
Recommend taking Heliocare sun protection supplement daily in sunny weather for additional sun protection. For maximum protection on the sunniest days, you can take up to 2 capsules of regular Heliocare OR take 1 capsule of Heliocare Ultra. For prolonged exposure (such as a full day in the sun), you can repeat your dose of the supplement 4 hours after your first dose. Heliocare can be purchased at [facility] or at [email or web address].   Melanoma ABCDEs  Melanoma is the most dangerous type of skin cancer, and is the leading cause of death from skin disease.  You are more likely to develop melanoma if you:  Have light-colored skin, light-colored eyes, or red or blond hair  Spend a lot of time in the sun  Tan regularly, either outdoors or in a tanning bed  Have had blistering sunburns, especially during childhood  Have a close family member who has had a melanoma  Have atypical moles or large birthmarks  Early detection of melanoma is key since treatment is typically straightforward and cure rates are extremely high if we catch it early.   The first sign of melanoma is often a change in a mole or a new dark spot.  The ABCDE system is a way of remembering the signs of melanoma.  A for asymmetry:  The two halves do not match. B for border:  The edges of the growth are irregular. C for color:  A mixture of colors are present instead of an even brown color. D for diameter:  Melanomas are usually (but not always) greater than 43mm - the size of a pencil eraser. E for evolution:  The spot keeps changing in size, shape, and color.  Please check your skin once per month between visits. You can use a small mirror in front and a large mirror behind you to keep an eye on the back side or your body.   If you see any new or changing lesions before your next follow-up, please call to schedule a visit.  Please continue daily skin protection including broad spectrum sunscreen SPF 30+ to  sun-exposed areas, reapplying every 2 hours as needed when you're outdoors.

## 2020-02-25 NOTE — Progress Notes (Signed)
   Follow-Up Visit   Subjective  Sierra Green is a 79 y.o. female who presents for the following: Annual Exam (Patient here today for TBSE. History of BCC at R ventral forearm 2019.).  Her lip healed well after shave biopsy of the hemangioma.  Patient not aware of anything new or changing today.  The following portions of the chart were reviewed this encounter and updated as appropriate:  Tobacco  Allergies  Meds  Problems  Med Hx  Surg Hx  Fam Hx      Review of Systems:  No other skin or systemic complaints except as noted in HPI or Assessment and Plan.  Objective  Well appearing patient in no apparent distress; mood and affect are within normal limits.  A full examination was performed including scalp, head, eyes, ears, nose, lips, neck, chest, axillae, abdomen, back, buttocks, bilateral upper extremities, bilateral lower extremities, hands, feet, fingers, toes, fingernails, and toenails. All findings within normal limits unless otherwise noted below.   Assessment & Plan    .Lentigines - Scattered tan macules - Discussed due to sun exposure - Benign, observe - Call for any changes  Seborrheic Keratoses - Stuck-on, waxy, tan-brown papules and plaques  - Discussed benign etiology and prognosis. - Observe - Call for any changes  Melanocytic Nevi - Tan-brown and/or pink-flesh-colored symmetric macules and papules - Benign appearing on exam today - Observation - Call clinic for new or changing moles - Recommend daily use of broad spectrum spf 30+ sunscreen to sun-exposed areas.   Hemangiomas - Red papules - Discussed benign nature - Observe - Call for any changes  Actinic Damage - diffuse scaly erythematous macules with underlying dyspigmentation - Recommend daily broad spectrum sunscreen SPF 30+ to sun-exposed areas, reapply every 2 hours as needed.  - Call for new or changing lesions.  Skin cancer screening performed today.  History of Basal Cell  Carcinoma of the Skin - No evidence of recurrence today at right ventral forearm - Recommend regular full body skin exams - Recommend daily broad spectrum sunscreen SPF 30+ to sun-exposed areas, reapply every 2 hours as needed.  - Call if any new or changing lesions are noted between office visits   Return in about 6 months (around 08/27/2020) for TBSE.  Graciella Belton, RMA, am acting as scribe for Forest Gleason, MD .  Documentation: I have reviewed the above documentation for accuracy and completeness, and I agree with the above.  Forest Gleason, MD

## 2020-03-01 ENCOUNTER — Encounter: Payer: Self-pay | Admitting: Dermatology

## 2020-07-09 NOTE — Progress Notes (Signed)
[place]  [phone] [phone]) [phone]  ID: Sierra Green OB: 04/15/1941  MR#: [id number]  [id number]  Patient Care Team: Ezequiel Kayser, MD as PCP - General (Internal Medicine)    CHIEF COMPLAINT: Recurrent right breast DCIS, status post simple mastectomy.  INTERVAL HISTORY: Patient returns to clinic today for routine yearly evaluation.  She currently feels well and is asymptomatic. She has no neurologic complaints. She denies any recent fevers or illnesses. She has a good appetite and denies weight loss.  She denies any chest pain, shortness of breath, cough, or hemoptysis.  She denies any nausea, vomiting, constipation, or diarrhea. She has no urinary complaints.  Patient offers no specific complaints today.  REVIEW OF SYSTEMS:   Review of Systems  Constitutional: Negative.  Negative for fever, malaise/fatigue and weight loss.  Respiratory: Negative.  Negative for cough and shortness of breath.   Cardiovascular: Negative.  Negative for chest pain and leg swelling.  Gastrointestinal: Negative.  Negative for abdominal pain.  Genitourinary: Negative.  Negative for dysuria.  Musculoskeletal: Negative.  Negative for back pain.  Skin: Negative.  Negative for rash.  Neurological: Negative.  Negative for sensory change, focal weakness, weakness and headaches.  Endo/Heme/Allergies: Does not bruise/bleed easily.  Psychiatric/Behavioral: Negative.  The patient is not nervous/anxious.    As per HPI. Otherwise, a complete review of systems is negative.   PAST MEDICAL HISTORY: Past Medical History:  Diagnosis Date  . Allergy    Seasonal  . Back pain   . Cancer [facility]) 2013   left breast  . Chronic atrial fibrillation ([place])    s/p 3 ablations, multiple cardioversions  . Dysrhythmia 1998   A-fib  . Hx of basal cell carcinoma 06/24/2018   R ventral  forearm  . Hyperlipidemia   . Hypertension   . Mitral valve regurgitation   . Osteoporosis    . Personal history of radiation therapy 2017   RIGHT lumpectomy  . Sensorineural hearing loss (SNHL) of both ears    worst on right  . Vitamin D deficiency     PAST SURGICAL HISTORY: Past Surgical History:  Procedure Laterality Date  . AUGMENTATION MAMMAPLASTY Bilateral 10/2011  . BREAST BIOPSY Right 08/2015   US guided biopsy - DCIS  . BREAST BIOPSY Left 2013   Stereotactic biopsy - [place]  . BREAST BIOPSY Right 02/24/2019   [facility]teen Sam, pending path  . BREAST IMPLANT REMOVAL Right 04/02/2019   Procedure: REMOVAL BREAST IMPLANTS;  Surgeon: Benjamine Sprague, DO;  Location: [facility] ORS;  Service: General;  Laterality: Right;  . BREAST LUMPECTOMY Right 2017  . BREAST LUMPECTOMY WITH NEEDLE LOCALIZATION Right 09/27/2015   Procedure: BREAST LUMPECTOMY WITH NEEDLE LOCALIZATION;  Surgeon: Hubbard Robinson, MD;  Location: [facility] ORS;  Service: General;  Laterality: Right;  . CATARACT EXTRACTION Bilateral Right- 05/2014; Left-06/2014  . COLONOSCOPY WITH PROPOFOL N/A 07/26/2017   Procedure: COLONOSCOPY WITH PROPOFOL;  Surgeon: [name], MD;  Location: [facility];  Service: Endoscopy;  Laterality: N/A;  . COLONOSCOPY WITH PROPOFOL N/A 01/29/2020   Procedure: COLONOSCOPY WITH PROPOFOL;  Surgeon: Lesly Rubenstein, MD;  Location: [facility] ENDOSCOPY;  Service: Gastroenterology;  Laterality: N/A;  . ESOPHAGOGASTRODUODENOSCOPY (EGD) WITH PROPOFOL N/A 01/29/2020   Procedure: ESOPHAGOGASTRODUODENOSCOPY (EGD) WITH PROPOFOL;  Surgeon: Lesly Rubenstein, MD;  Location: [facility] ENDOSCOPY;  Service: Gastroenterology;  Laterality: N/A;  . heart ablation     1 at [facility] and 2 at [facility]  . MASTECTOMY Left 07/2011  [place]  . PROXIMAL INTERPHALANGEAL FUSION (PIP) Right 05/15/2013   2nd, 3rd, and 5th digits  . SENTINEL NODE BIOPSY Right 09/27/2015   Procedure: SENTINEL NODE BIOPSY;  Surgeon: Hubbard Robinson, MD;  Location: [facility] ORS;  Service: General;  Laterality: Right;   . SIMPLE MASTECTOMY WITH AXILLARY SENTINEL NODE BIOPSY Right 04/02/2019   Procedure: Right simple mastectomy and implant removal;  Surgeon: Benjamine Sprague, DO;  Location: [facility] ORS;  Service: General;  Laterality: Right;  . TONSILLECTOMY  as child  . TUBAL LIGATION Bilateral 1976   Laparoscopic    FAMILY HISTORY Family History  Problem Relation Age of Onset  . Breast cancer Neg Hx        ADVANCED DIRECTIVES:    HEALTH MAINTENANCE: Social History   Tobacco Use  . Smoking status: Former Smoker    Quit date: 07/02/1966    Years since quitting: 54.0  . Smokeless tobacco: Never Used  Vaping Use  . Vaping Use: Never used  Substance Use Topics  . Alcohol use: Yes    Alcohol/week: 4.0 - 5.0 standard drinks    Types: 4 - 5 Glasses of wine per week  . Drug use: No     Colonoscopy:  PAP:  Bone density:  Lipid panel:  Allergies  Allergen Reactions  . Oxycodone Anaphylaxis  . Zoledronic Acid Other (See Comments)    Iritis  . Boniva [Ibandronic Acid] Other (See Comments)    Flu like symptoms  . Alendronate Other (See Comments)    Flu like symptoms  . Honey Bee Venom Swelling    Marked local swelling    Current Outpatient Medications  Medication Sig Dispense Refill  . Ascorbic Acid (VITAMIN C) 1000 MG tablet Take 1,000 mg by mouth 4 (four) times a week. [place] Reek, [place], and Sat    . Cholecalciferol (VITAMIN D3) 5000 units TABS Take 5,000 Units by mouth every Monday, Tuesday, Wednesday, Thursday, and Friday.     . diltiazem (TAZTIA XT) 120 MG 24 hr capsule Take 120 mg by mouth 2 (two) times daily.     . fexofenadine (ALLEGRA) 180 MG tablet Take 180 mg by mouth daily as needed for allergies.     Marland Kitchen letrozole (FEMARA) 2.5 MG tablet Take 1 tablet (2.5 mg total) by mouth daily. (Patient not taking: Reported on 07/13/2019) 90 tablet 3  . Melatonin 1 MG CAPS Take 1 mg by mouth at bedtime.    . niacinamide 500 MG tablet Take by mouth.    . Red Yeast Rice Extract 600 MG CAPS Take 600 mg  by mouth at bedtime.     Marland Kitchen warfarin (COUMADIN) 5 MG tablet Take 2.5-5 mg by mouth See admin instructions. Take 2.5 mg at bedtime on Mon and Fri. Take 5 mg at bedtime on Sun, Tue, Wed, Thur, and Sat     No current facility-administered medications for this visit.    OBJECTIVE: Vitals:   07/14/20 1047  BP: 128/74  Pulse: 83     Body mass index is 21.19 kg/m.    ECOG FS:0 - Asymptomatic  General: Well-developed, well-nourished, no acute distress. Eyes: Pink conjunctiva, anicteric sclera. HEENT: Normocephalic, moist mucous membranes. Breast: Bilateral mastectomy. Lungs: No audible wheezing or coughing. Heart: Regular rate and rhythm. Abdomen: Soft, nontender, no obvious distention. Musculoskeletal: No edema, cyanosis, or clubbing. Neuro: Alert, answering all questions appropriately. Cranial nerves grossly intact. Skin: No rashes or petechiae noted. Psych: Normal affect.    LAB RESULTS:  Lab Results  Component Value Date   NA 137 04/03/2019   K 4.7 04/03/2019   CL 105 04/03/2019   CO2 24 04/03/2019   GLUCOSE 165 (H) 04/03/2019   BUN 12 04/03/2019   CREATININE 0.59 04/03/2019   CALCIUM 8.9 04/03/2019   PROT 7.3 09/19/2015   ALBUMIN 4.4 09/19/2015   AST 25 09/19/2015   ALT 16 09/19/2015   ALKPHOS 67 09/19/2015   BILITOT 0.9 09/19/2015   GFRNONAA >60 04/03/2019   GFRAA >60 04/03/2019    Lab Results  Component Value Date   WBC 13.3 (H) 04/03/2019   NEUTROABS 4.9 09/19/2015   HGB 12.2 04/03/2019   HCT 37.2 04/03/2019   MCV 95.4 04/03/2019   PLT 160 04/03/2019     STUDIES: No results found.  ASSESSMENT: Recurrent right breast DCIS.  PLAN:    1. DCIS: Patient was diagnosed with DCIS in her left breast is approximately 2013 and underwent mastectomy, but did not take adjuvant hormonal therapy.  Previously, patient underwent lumpectomy for her right breast DCIS but was noted to have focally positive margins.  She declined reexcision at that time and proceeded with  adjuvant XRT.  She then was diagnosed with recurrent right breast DCIS without invasive component and underwent simple mastectomy on April 02, 2019.  Patient was placed on letrozole given her osteoporosis, but then quickly discontinued secondary to side effects.  She did not wish to start tamoxifen or another aromatase inhibitor.  No further interventions needed.  Patient does not require mammograms given her bilateral mastectomy.  After lengthy discussion with the patient, it was agreed upon that no further follow-up is necessary.  She will continue to follow-up with her primary care on a regular basis.  Please refer patient back if there are any questions or concerns.   2.  Osteoporosis: Patient had a bone mineral density on February 16, 2019 which reported a T score of -2.5.  By report this has improved since July 2018.  Continue follow-up with endocrinology and primary care as scheduled.  Continue calcium and vitamin D supplementation. 3. Anticoagulation: Continue Coumadin. Patient states that she cannot take Xarelto or Eliquis. Continue INR checks per primary care.  I spent a total of 20 minutes reviewing chart data, face-to-face evaluation with the patient, counseling and coordination of care as detailed above.   Patient expressed understanding and was in agreement with this plan. She also understands that She can call clinic at any time with any questions, concerns, or complaints.    Ductal carcinoma in situ (DCIS) of right breast   Staging form: Breast, AJCC 7th Edition     Clinical stage from 10/16/2015: Stage 0 (Tis (DCIS), N0, M0) - Signed by Lloyd Huger, MD on 10/16/2015   Lloyd Huger, MD   07/15/2020 10:27 AM

## 2020-07-14 ENCOUNTER — Encounter: Payer: Self-pay | Admitting: Oncology

## 2020-07-14 ENCOUNTER — Other Ambulatory Visit: Payer: Self-pay

## 2020-07-14 ENCOUNTER — Inpatient Hospital Stay: Payer: Medicare HMO | Attending: Oncology | Admitting: Oncology

## 2020-07-14 VITALS — BP 128/74 | HR 83 | Wt 119.6 lb

## 2020-07-14 DIAGNOSIS — I482 Chronic atrial fibrillation, unspecified: Secondary | ICD-10-CM | POA: Diagnosis not present

## 2020-07-14 DIAGNOSIS — Z170 Estrogen receptor positive status [ER+]: Secondary | ICD-10-CM | POA: Diagnosis not present

## 2020-07-14 DIAGNOSIS — Z79811 Long term (current) use of aromatase inhibitors: Secondary | ICD-10-CM | POA: Diagnosis not present

## 2020-07-14 DIAGNOSIS — Z85828 Personal history of other malignant neoplasm of skin: Secondary | ICD-10-CM | POA: Diagnosis not present

## 2020-07-14 DIAGNOSIS — Z79899 Other long term (current) drug therapy: Secondary | ICD-10-CM | POA: Diagnosis not present

## 2020-07-14 DIAGNOSIS — Z9013 Acquired absence of bilateral breasts and nipples: Secondary | ICD-10-CM | POA: Diagnosis not present

## 2020-07-14 DIAGNOSIS — E785 Hyperlipidemia, unspecified: Secondary | ICD-10-CM | POA: Diagnosis not present

## 2020-07-14 DIAGNOSIS — Z87891 Personal history of nicotine dependence: Secondary | ICD-10-CM | POA: Insufficient documentation

## 2020-07-14 DIAGNOSIS — M810 Age-related osteoporosis without current pathological fracture: Secondary | ICD-10-CM | POA: Diagnosis not present

## 2020-07-14 DIAGNOSIS — I10 Essential (primary) hypertension: Secondary | ICD-10-CM | POA: Insufficient documentation

## 2020-07-14 DIAGNOSIS — D0511 Intraductal carcinoma in situ of right breast: Secondary | ICD-10-CM

## 2020-07-27 DIAGNOSIS — A048 Other specified bacterial intestinal infections: Secondary | ICD-10-CM | POA: Insufficient documentation

## 2020-08-18 ENCOUNTER — Encounter: Payer: Medicare HMO | Admitting: Dermatology

## 2020-08-25 ENCOUNTER — Other Ambulatory Visit: Payer: Self-pay

## 2020-08-25 ENCOUNTER — Ambulatory Visit: Payer: Medicare HMO | Admitting: Dermatology

## 2020-08-25 DIAGNOSIS — L814 Other melanin hyperpigmentation: Secondary | ICD-10-CM

## 2020-08-25 DIAGNOSIS — Z1283 Encounter for screening for malignant neoplasm of skin: Secondary | ICD-10-CM | POA: Diagnosis not present

## 2020-08-25 DIAGNOSIS — M1990 Unspecified osteoarthritis, unspecified site: Secondary | ICD-10-CM | POA: Diagnosis not present

## 2020-08-25 DIAGNOSIS — Z85828 Personal history of other malignant neoplasm of skin: Secondary | ICD-10-CM

## 2020-08-25 DIAGNOSIS — L821 Other seborrheic keratosis: Secondary | ICD-10-CM

## 2020-08-25 DIAGNOSIS — B078 Other viral warts: Secondary | ICD-10-CM

## 2020-08-25 DIAGNOSIS — L720 Epidermal cyst: Secondary | ICD-10-CM

## 2020-08-25 DIAGNOSIS — D1800 Hemangioma unspecified site: Secondary | ICD-10-CM

## 2020-08-25 DIAGNOSIS — L578 Other skin changes due to chronic exposure to nonionizing radiation: Secondary | ICD-10-CM

## 2020-08-25 DIAGNOSIS — D229 Melanocytic nevi, unspecified: Secondary | ICD-10-CM

## 2020-08-25 NOTE — Progress Notes (Signed)
   Follow-Up Visit   Subjective  Sierra Green is a 80 y.o. female who presents for the following: TBSE (Patient here for full body skin exam and skin cancer screening. Patient with hx BCC. She does have a spot at right eyelid, present for about 6 weeks. No symptoms. Also a spot at back of scalp that may have resolved. ).  Patient accompanied by husband.  The following portions of the chart were reviewed this encounter and updated as appropriate:   Tobacco  Allergies  Meds  Problems  Med Hx  Surg Hx  Fam Hx      Review of Systems:  No other skin or systemic complaints except as noted in HPI or Assessment and Plan.  Objective  Well appearing patient in no apparent distress; mood and affect are within normal limits.  A full examination was performed including scalp, head, eyes, ears, nose, lips, neck, chest, axillae, abdomen, back, buttocks, bilateral upper extremities, bilateral lower extremities, hands, feet, fingers, toes, fingernails, and toenails. All findings within normal limits unless otherwise noted below.  Objective  Right lateral canthus: Smooth white papule with no suspicious features on dermoscopy  Objective  bilateral hands: Significant inflammation multiple PIP joints  Objective  bilateral plantar feet: Verrucous papules -- Discussed viral etiology and contagion.    Assessment & Plan  [place] Right lateral canthus  Benign-appearing.  Observation.  Call clinic for new or changing lesions.    Arthritis bilateral hands  Recommend patient be evaluated by rheumatologist, will send referral.   Other viral warts bilateral plantar feet  Patient defers treatment in office today.  Recommend she start over the counter salicylic acid and cover with duct tape nightly, wash off in the morning.    History of Basal Cell Carcinoma of the Skin - No evidence of recurrence today at right ventral forearm - Recommend regular full body skin exams - Recommend  daily broad spectrum sunscreen SPF 30+ to sun-exposed areas, reapply every 2 hours as needed.  - Call if any new or changing lesions are noted between office visits - She does not tolerate nicotinamide  Lentigines - Scattered tan macules - Due to sun exposure - Benign-appering, observe - Recommend daily broad spectrum sunscreen SPF 30+ to sun-exposed areas, reapply every 2 hours as needed. - Call for any changes  Seborrheic Keratoses - Stuck-on, waxy, tan-brown papules and plaques  - Discussed benign etiology and prognosis. - Observe - Call for any changes  Melanocytic Nevi - Tan-brown and/or pink-flesh-colored symmetric macules and papules - Benign appearing on exam today - Observation - Call clinic for new or changing moles - Recommend daily use of broad spectrum spf 30+ sunscreen to sun-exposed areas.   Hemangiomas - Red papules - Discussed benign nature - Observe - Call for any changes  Actinic Damage - Chronic, secondary to cumulative UV/sun exposure - diffuse scaly erythematous macules with underlying dyspigmentation - Recommend daily broad spectrum sunscreen SPF 30+ to sun-exposed areas, reapply every 2 hours as needed.  - Call for new or changing lesions.  Skin cancer screening performed today.   Return in about 6 months (around 02/22/2021) for TBSE.  Graciella Belton, RMA, am acting as scribe for Forest Gleason, MD .  Documentation: I have reviewed the above documentation for accuracy and completeness, and I agree with the above.  Forest Gleason, MD

## 2020-08-25 NOTE — Patient Instructions (Addendum)
Melanoma ABCDEs  Melanoma is the most dangerous type of skin cancer, and is the leading cause of death from skin disease.  You are more likely to develop melanoma if you:  Have light-colored skin, light-colored eyes, or red or blond hair  Spend a lot of time in the sun  Tan regularly, either outdoors or in a tanning bed  Have had blistering sunburns, especially during childhood  Have a close family member who has had a melanoma  Have atypical moles or large birthmarks  Early detection of melanoma is key since treatment is typically straightforward and cure rates are extremely high if we catch it early.   The first sign of melanoma is often a change in a mole or a new dark spot.  The ABCDE system is a way of remembering the signs of melanoma.  A for asymmetry:  The two halves do not match. B for border:  The edges of the growth are irregular. C for color:  A mixture of colors are present instead of an even brown color. D for diameter:  Melanomas are usually (but not always) greater than 46mm - the size of a pencil eraser. E for evolution:  The spot keeps changing in size, shape, and color.  Please check your skin once per month between visits. You can use a small mirror in front and a large mirror behind you to keep an eye on the back side or your body.   If you see any new or changing lesions before your next follow-up, please call to schedule a visit.  Please continue daily skin protection including broad spectrum sunscreen SPF 30+ to sun-exposed areas, reapplying every 2 hours as needed when you're outdoors.   For warts at bottom of feet -  Recommend she start over the counter salicylic acid and cover with duct tape nightly, wash off in the morning.

## 2020-08-29 ENCOUNTER — Encounter: Payer: Self-pay | Admitting: Dermatology

## 2020-08-29 ENCOUNTER — Other Ambulatory Visit: Payer: Self-pay

## 2020-08-29 DIAGNOSIS — M1990 Unspecified osteoarthritis, unspecified site: Secondary | ICD-10-CM

## 2021-01-31 DIAGNOSIS — M159 Polyosteoarthritis, unspecified: Secondary | ICD-10-CM | POA: Insufficient documentation

## 2021-01-31 DIAGNOSIS — M0579 Rheumatoid arthritis with rheumatoid factor of multiple sites without organ or systems involvement: Secondary | ICD-10-CM | POA: Insufficient documentation

## 2021-02-23 ENCOUNTER — Other Ambulatory Visit: Payer: Self-pay

## 2021-02-23 ENCOUNTER — Ambulatory Visit: Payer: Medicare HMO | Admitting: Dermatology

## 2021-02-23 DIAGNOSIS — Z85828 Personal history of other malignant neoplasm of skin: Secondary | ICD-10-CM | POA: Diagnosis not present

## 2021-02-23 DIAGNOSIS — L578 Other skin changes due to chronic exposure to nonionizing radiation: Secondary | ICD-10-CM

## 2021-02-23 DIAGNOSIS — L814 Other melanin hyperpigmentation: Secondary | ICD-10-CM

## 2021-02-23 DIAGNOSIS — Z1283 Encounter for screening for malignant neoplasm of skin: Secondary | ICD-10-CM | POA: Diagnosis not present

## 2021-02-23 DIAGNOSIS — D1800 Hemangioma unspecified site: Secondary | ICD-10-CM

## 2021-02-23 DIAGNOSIS — L821 Other seborrheic keratosis: Secondary | ICD-10-CM

## 2021-02-23 DIAGNOSIS — D229 Melanocytic nevi, unspecified: Secondary | ICD-10-CM

## 2021-02-23 NOTE — Patient Instructions (Signed)

## 2021-02-23 NOTE — Progress Notes (Signed)
   Follow-Up Visit   Subjective  Sierra Green is a 80 y.o. female who presents for the following: Annual Exam (Hx BCC - patient has noticed no new or changing moles, lesions, or spots.). 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 He prescribed Plaquenil and she tolerated it well, then he added MTX and she started having vision changes. He D/C'ed those medications and patient followed up with ophthalmologist who diagnosed her with blocked ducts for which is is using antibiotic drops to the eyes. Ophthalmologist was addiment that vision changes were not caused by MTX and Plaquenil. Patient unable to tolerate Nicotinamide - causes GI issues.    The patient presents for Total-Body Skin Exam (TBSE) for skin cancer screening and mole check.   The following portions of the chart were reviewed this encounter and updated as appropriate:   Tobacco  Allergies  Meds  Problems  Med Hx  Surg Hx  Fam Hx     Review of Systems:  No other skin or systemic complaints except as noted in HPI or Assessment and Plan.  Objective  Well appearing patient in no apparent distress; mood and affect are within normal limits.  A full examination was performed including scalp, head, eyes, ears, nose, lips, neck, chest, axillae, abdomen, back, buttocks, bilateral upper extremities, bilateral lower extremities, hands, feet, fingers, toes, fingernails, and toenails. All findings within normal limits unless otherwise noted below.   Assessment & Plan   Lentigines - Scattered tan macules - Due to sun exposure - Benign-appering, observe - Recommend daily broad spectrum sunscreen SPF 30+ to sun-exposed areas, reapply every 2 hours as needed. - Call for any changes  Seborrheic Keratoses - Stuck-on, waxy, tan-brown papules and/or plaques  - Benign-appearing - Discussed benign etiology and prognosis. - Observe - Call for any changes  Melanocytic Nevi - Tan-brown and/or pink-flesh-colored symmetric macules  and papules - Benign appearing on exam today - Observation - Call clinic for new or changing moles - Recommend daily use of broad spectrum spf 30+ sunscreen to sun-exposed areas.   Hemangiomas - Red papules - Discussed benign nature - Observe - Call for any changes  Actinic Damage - Chronic condition, secondary to cumulative UV/sun exposure - diffuse scaly erythematous macules with underlying dyspigmentation - Recommend daily broad spectrum sunscreen SPF 30+ to sun-exposed areas, reapply every 2 hours as needed.  - Staying in the shade or wearing long sleeves, sun glasses (UVA+UVB protection) and wide brim hats (4-inch brim around the entire circumference of the hat) are also recommended for sun protection.  - Call for new or changing lesions.  History of Basal Cell Carcinoma of the Skin - No evidence of recurrence today - Recommend regular full body skin exams - Recommend daily broad spectrum sunscreen SPF 30+ to sun-exposed areas, reapply every 2 hours as needed.  - Call if any new or changing lesions are noted between office visits  Skin cancer screening performed today.  Return in about 6 months (around 08/26/2021) for TBSE.  Luther Redo, CMA, am acting as scribe for Forest Gleason, MD .  Documentation: I have reviewed the above documentation for accuracy and completeness, and I agree with the above.  Forest Gleason, MD

## 2021-02-26 ENCOUNTER — Encounter: Payer: Self-pay | Admitting: Dermatology

## 2021-08-17 ENCOUNTER — Ambulatory Visit: Payer: Medicare HMO | Admitting: Dermatology

## 2021-08-17 ENCOUNTER — Other Ambulatory Visit: Payer: Self-pay

## 2021-08-17 ENCOUNTER — Encounter: Payer: Self-pay | Admitting: Dermatology

## 2021-08-17 DIAGNOSIS — Z1283 Encounter for screening for malignant neoplasm of skin: Secondary | ICD-10-CM | POA: Diagnosis not present

## 2021-08-17 DIAGNOSIS — L508 Other urticaria: Secondary | ICD-10-CM | POA: Diagnosis not present

## 2021-08-17 DIAGNOSIS — L814 Other melanin hyperpigmentation: Secondary | ICD-10-CM

## 2021-08-17 DIAGNOSIS — D1800 Hemangioma unspecified site: Secondary | ICD-10-CM

## 2021-08-17 DIAGNOSIS — W57XXXA Bitten or stung by nonvenomous insect and other nonvenomous arthropods, initial encounter: Secondary | ICD-10-CM

## 2021-08-17 DIAGNOSIS — Z85828 Personal history of other malignant neoplasm of skin: Secondary | ICD-10-CM | POA: Diagnosis not present

## 2021-08-17 DIAGNOSIS — L578 Other skin changes due to chronic exposure to nonionizing radiation: Secondary | ICD-10-CM | POA: Diagnosis not present

## 2021-08-17 DIAGNOSIS — L821 Other seborrheic keratosis: Secondary | ICD-10-CM

## 2021-08-17 DIAGNOSIS — L282 Other prurigo: Secondary | ICD-10-CM

## 2021-08-17 DIAGNOSIS — D229 Melanocytic nevi, unspecified: Secondary | ICD-10-CM

## 2021-08-17 MED ORDER — CLOBETASOL PROPIONATE 0.05 % EX CREA: TOPICAL_CREAM | CUTANEOUS | 1 refills | Status: DC

## 2021-08-17 NOTE — Progress Notes (Signed)
Follow-Up Visit   Subjective  Sierra Green is a 81 y.o. female who presents for the following: Annual Exam (Here for skin cancer screening. Full body. Hx of BCC).  The patient presents for Total-Body Skin Exam (TBSE) for skin cancer screening and mole check.  The patient has spots, moles and lesions to be evaluated, some may be new or changing and the patient has concerns that these could be cancer.   The following portions of the chart were reviewed this encounter and updated as appropriate:  Tobacco   Allergies   Meds   Problems   Med Hx   Surg Hx   Fam Hx       Review of Systems: No other skin or systemic complaints except as noted in HPI or Assessment and Plan.   Objective  Well appearing patient in no apparent distress; mood and affect are within normal limits.  A full examination was performed including scalp, head, eyes, ears, nose, lips, neck, chest, axillae, abdomen, back, buttocks, bilateral upper extremities, bilateral lower extremities, hands, feet, fingers, toes, fingernails, and toenails. All findings within normal limits unless otherwise noted below.  Right Lower Leg - Anterior Clear today   Assessment & Plan   Lentigines - Scattered tan macules - Due to sun exposure - Benign-appearing, observe - Recommend daily broad spectrum sunscreen SPF 30+ to sun-exposed areas, reapply every 2 hours as needed. - Call for any changes  Seborrheic Keratoses - Stuck-on, waxy, tan-brown papules and/or plaques  - Benign-appearing - Discussed benign etiology and prognosis. - Observe - Call for any changes  Melanocytic Nevi - Tan-brown and/or pink-flesh-colored symmetric macules and papules - Benign appearing on exam today - Observation - Call clinic for new or changing moles - Recommend daily use of broad spectrum spf 30+ sunscreen to sun-exposed areas.   Hemangiomas - Red papules - Discussed benign nature - Observe - Call for any changes  Actinic Damage -  Chronic condition, secondary to cumulative UV/sun exposure - diffuse scaly erythematous macules with underlying dyspigmentation - Recommend daily broad spectrum sunscreen SPF 30+ to sun-exposed areas, reapply every 2 hours as needed.  - Staying in the shade or wearing long sleeves, sun glasses (UVA+UVB protection) and wide brim hats (4-inch brim around the entire circumference of the hat) are also recommended for sun protection.  - Call for new or changing lesions.  History of Basal Cell Carcinoma of the Skin - No evidence of recurrence today at right ventral forearm - Recommend regular full body skin exams - Recommend daily broad spectrum sunscreen SPF 30+ to sun-exposed areas, reapply every 2 hours as needed.  - Call if any new or changing lesions are noted between office visits   Skin cancer screening performed today.  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 Currently well-controlled.  H/O insect bites during summer with significant itch/inflammation.  Start Clobetasol 0.05% cream twice daily up to 2 weeks as needed. Avoid applying to face, groin, and axilla. Use as directed. Long-term use can cause thinning of the skin.  Topical steroids (such as triamcinolone, fluocinolone, fluocinonide, mometasone, clobetasol, halobetasol, betamethasone, hydrocortisone) can cause thinning and lightening of the skin if they are used for too long in the same area. Your physician has selected the right strength medicine for your problem and area affected on the body. Please use your medication only as directed by your physician to prevent side effects.  clobetasol cream (TEMOVATE) 0.05 % - Right Lower Leg - Anterior Apply twice daily up to 2 weeks as needed. Avoid applying to face, groin, and axilla.   Return in about 1 year (around 08/17/2022) for TBSE.  I, Emelia Salisbury, CMA, am acting as scribe for Forest Gleason,  MD.  Documentation: I have reviewed the above documentation for accuracy and completeness, and I agree with the above.  Forest Gleason, MD

## 2021-08-17 NOTE — Patient Instructions (Addendum)
Start Clobetasol 0.05% cream twice daily up to 2 weeks as needed. Avoid applying to face, groin, and axilla. Use as directed. Long-term use can cause thinning of the skin.  Topical steroids (such as triamcinolone, fluocinolone, fluocinonide, mometasone, clobetasol, halobetasol, betamethasone, hydrocortisone) can cause thinning and lightening of the skin if they are used for too long in the same area. Your physician has selected the right strength medicine for your problem and area affected on the body. Please use your medication only as directed by your physician to prevent side effects.    Recommend taking Heliocare sun protection supplement daily in sunny weather for additional sun protection. For maximum protection on the sunniest days, you can take up to 2 capsules of regular Heliocare OR take 1 capsule of Heliocare Ultra. For prolonged exposure (such as a full day in the sun), you can repeat your dose of the supplement 4 hours after your first dose. Heliocare can be purchased at [employer], at some [facility] or at [email or web address].    Recommend daily broad spectrum sunscreen SPF 30+ to sun-exposed areas, reapply every 2 hours as needed. Call for new or changing lesions.  Staying in the shade or wearing long sleeves, sun glasses (UVA+UVB protection) and wide brim hats (4-inch brim around the entire circumference of the hat) are also recommended for sun protection.   Melanoma ABCDEs  Melanoma is the most dangerous type of skin cancer, and is the leading cause of death from skin disease.  You are more likely to develop melanoma if you: Have light-colored skin, light-colored eyes, or red or blond hair Spend a lot of time in the sun Tan regularly, either outdoors or in a tanning bed Have had blistering sunburns, especially during childhood Have a close family member who has had a melanoma Have atypical moles or large birthmarks  Early detection of melanoma is key since treatment is  typically straightforward and cure rates are extremely high if we catch it early.   The first sign of melanoma is often a change in a mole or a new dark spot.  The ABCDE system is a way of remembering the signs of melanoma.  A for asymmetry:  The two halves do not match. B for border:  The edges of the growth are irregular. C for color:  A mixture of colors are present instead of an even brown color. D for diameter:  Melanomas are usually (but not always) greater than 35mm - the size of a pencil eraser. E for evolution:  The spot keeps changing in size, shape, and color.  Please check your skin once per month between visits. You can use a small mirror in front and a large mirror behind you to keep an eye on the back side or your body.   If you see any new or changing lesions before your next follow-up, please call to schedule a visit.  Please continue daily skin protection including broad spectrum sunscreen SPF 30+ to sun-exposed areas, reapplying every 2 hours as needed when you're outdoors.   Staying in the shade or wearing long sleeves, sun glasses (UVA+UVB protection) and wide brim hats (4-inch brim around the entire circumference of the hat) are also recommended for sun protection.    If You Need Anything After Your Visit  If you have any questions or concerns for your doctor, please call our main line at [phone] and press option 4 to reach your doctor's medical assistant. If no one answers, please leave a voicemail as  directed and we will return your call as soon as possible. Messages left after 4 pm will be answered the following business day.   You may also send [place] a message via [place]. We typically respond to MyChart messages within 1-2 business days.  For prescription refills, please ask your pharmacy to contact our office. Our fax number is [phone].  If you have an urgent issue when the clinic is closed that cannot wait until the next business day, you can page your  doctor at the number below.    Please note that while we do our best to be available for urgent issues outside of office hours, we are not available 24/7.   If you have an urgent issue and are unable to reach [place], you may choose to seek medical care at your doctor's office, retail clinic, urgent care center, or emergency room.  If you have a medical emergency, please immediately call 911 or go to the emergency department.  Pager Numbers  - Dr. Nehemiah Massed: [phone]  - Dr. Laurence Ferrari: [phone]  - Dr. Nicole Kindred: [phone]  In the event of inclement weather, please call our main line at [phone] for an update on the status of any delays or closures.  Dermatology Medication Tips: Please keep the boxes that topical medications come in in order to help keep track of the instructions about where and how to use these. Pharmacies typically print the medication instructions only on the boxes and not directly on the medication tubes.   If your medication is too expensive, please contact our office at [phone] option 4 or send [place] a message through [place].   We are unable to tell what your co-pay for medications will be in advance as this is different depending on your insurance coverage. However, we may be able to find a substitute medication at lower cost or fill out paperwork to get insurance to cover a needed medication.   If a prior authorization is required to get your medication covered by your insurance company, please allow [place] 1-2 business days to complete this process.  Drug prices often vary depending on where the prescription is filled and some pharmacies may offer cheaper prices.  The website www.goodrx.com contains coupons for medications through different pharmacies. The prices here do not account for what the cost may be with help from insurance (it may be cheaper with your insurance), but the website can give you the price if you did not use any insurance.  - You can print  the associated coupon and take it with your prescription to the pharmacy.  - You may also stop by our office during regular business hours and pick up a GoodRx coupon card.  - If you need your prescription sent electronically to a different pharmacy, notify our office through [facility] or by phone at [phone] option 4.     Si Usted Necesita Algo Despus de Su Visita  Tambin puede enviarnos un mensaje a travs de Pharmacist, community. Por lo general respondemos a los mensajes de MyChart en el transcurso de 1 a 2 das hbiles.  Para renovar recetas, por favor pida a su farmacia que se ponga en contacto con nuestra oficina. Harland Dingwall de fax es [place] [phone].  Si tiene un asunto urgente cuando la clnica est cerrada y que no puede esperar hasta el siguiente da hbil, puede llamar/localizar a su doctor(a) al nmero que aparece a continuacin.   Por favor, tenga en cuenta que aunque hacemos todo lo posible para  estar disponibles para asuntos urgentes fuera del horario de [place], no estamos disponibles las 24 horas del da, los 7 das de la [place].   Si tiene un problema urgente y no puede comunicarse con nosotros, puede optar por buscar atencin mdica  en el consultorio de su doctor(a), en una clnica privada, en un centro de atencin urgente o en una sala de emergencias.  Si tiene Engineering geologist, por favor llame inmediatamente al 911 o vaya a la sala de emergencias.  Nmeros de bper  - Dr. Nehemiah Massed: [phone]  - Dra. Moye: [phone]  - Dra. Nicole Kindred: [phone]  En caso de inclemencias del [place], por favor llame a Johnsie Kindred principal al [phone] para una actualizacin sobre el [place] de cualquier retraso o cierre.  Consejos para la medicacin en dermatologa: Por favor, guarde las cajas en las que vienen los medicamentos de uso tpico para ayudarle a seguir las instrucciones sobre dnde y cmo usarlos. Las farmacias generalmente imprimen las  instrucciones del medicamento slo en las cajas y no directamente en los tubos del [place].   Si su medicamento es muy caro, por favor, pngase en contacto con Zigmund Daniel llamando al [phone] y presione la opcin 4 o envenos un mensaje a travs de Pharmacist, community.   No podemos decirle cul ser su copago por los medicamentos por adelantado ya que esto es diferente dependiendo de la cobertura de su seguro. Sin embargo, es posible que podamos encontrar un medicamento sustituto a Electrical engineer un formulario para que el seguro cubra el medicamento que se considera necesario.   Si se requiere una autorizacin previa para que su compaa de seguros [place] su medicamento, por favor permtanos de 1 a 2 das hbiles para completar este proceso.  Los precios de los medicamentos varan con frecuencia dependiendo del Environmental consultant de dnde se surte la receta y alguna farmacias pueden ofrecer precios ms baratos.  El sitio web www.goodrx.com tiene cupones para medicamentos de Airline pilot. Los precios aqu no tienen en cuenta lo que podra costar con la ayuda del seguro (puede ser ms barato con su seguro), pero el sitio web puede darle el precio si no utiliz Research scientist (physical sciences).  - Puede imprimir el cupn correspondiente y llevarlo con su receta a la farmacia.  - Tambin puede pasar por nuestra oficina durante el horario de atencin regular y Charity fundraiser una tarjeta de cupones de GoodRx.  - Si necesita que su receta se enve electrnicamente a una farmacia diferente, informe a nuestra oficina a travs de MyChart de [place] o por telfono llamando al [phone] y presione la opcin 4.

## 2021-08-28 ENCOUNTER — Encounter: Payer: Self-pay | Admitting: Dermatology

## 2021-12-27 DIAGNOSIS — M19049 Primary osteoarthritis, unspecified hand: Secondary | ICD-10-CM | POA: Insufficient documentation

## 2022-05-18 DIAGNOSIS — N280 Ischemia and infarction of kidney: Secondary | ICD-10-CM | POA: Insufficient documentation

## 2022-06-26 DIAGNOSIS — I5032 Chronic diastolic (congestive) heart failure: Secondary | ICD-10-CM | POA: Insufficient documentation

## 2022-07-09 DIAGNOSIS — Z952 Presence of prosthetic heart valve: Secondary | ICD-10-CM | POA: Insufficient documentation

## 2022-07-09 DIAGNOSIS — R0689 Other abnormalities of breathing: Secondary | ICD-10-CM | POA: Insufficient documentation

## 2022-07-10 DIAGNOSIS — G479 Sleep disorder, unspecified: Secondary | ICD-10-CM | POA: Insufficient documentation

## 2022-07-24 ENCOUNTER — Encounter: Payer: Medicare HMO | Attending: Family Medicine | Admitting: *Deleted

## 2022-07-24 DIAGNOSIS — Z48812 Encounter for surgical aftercare following surgery on the circulatory system: Secondary | ICD-10-CM | POA: Insufficient documentation

## 2022-07-24 DIAGNOSIS — Z952 Presence of prosthetic heart valve: Secondary | ICD-10-CM

## 2022-07-24 NOTE — Progress Notes (Signed)
Initial phone call completed. Diagnosis can be found in [facility] 1/7. EP Orientation scheduled for Thursday 1/25 at 9:30am.

## 2022-07-26 VITALS — Ht 64.0 in | Wt 113.5 lb

## 2022-07-26 DIAGNOSIS — Z952 Presence of prosthetic heart valve: Secondary | ICD-10-CM

## 2022-07-26 DIAGNOSIS — Z48812 Encounter for surgical aftercare following surgery on the circulatory system: Secondary | ICD-10-CM | POA: Diagnosis not present

## 2022-07-26 NOTE — Progress Notes (Signed)
Cardiac Individual Treatment Plan  Patient Details  Name: Sierra Green MRN: [id number] Date of Birth: [birth year] Referring Provider:   Flowsheet Row Cardiac Rehab from 07/26/2022 in [facility] Cardiac and Pulmonary Rehab  Referring Provider Dr. Thereasa Distance MD       Initial Encounter Date:  Flowsheet Row Cardiac Rehab from 07/26/2022 in [facility] Cardiac and Pulmonary Rehab  Date 07/26/22       Visit Diagnosis: S/P TAVR (transcatheter aortic valve replacement)  Patient's Home Medications on Admission:  Current Outpatient Medications:    Ascorbic Acid (VITAMIN C) 1000 MG tablet, Take 1,000 mg by mouth 4 (four) times a week. [place], Thur, and Sat, Disp: , Rfl:    Cholecalciferol (VITAMIN D3) 5000 units TABS, Take 5,000 Units by mouth every Monday, Tuesday, Wednesday, Thursday, and Friday. , Disp: , Rfl:    clobetasol cream (TEMOVATE) 0.05 %, Apply twice daily up to 2 weeks as needed. Avoid applying to face, groin, and axilla., Disp: 30 g, Rfl: 1   diltiazem (CARDIZEM CD) 120 MG 24 hr capsule, Take by mouth., Disp: , Rfl:    ferrous sulfate 324 (65 Fe) MG TBEC, Take by mouth., Disp: , Rfl:    fexofenadine (ALLEGRA) 180 MG tablet, Take 180 mg by mouth daily as needed for allergies. , Disp: , Rfl:    folic acid (FOLVITE) 1 MG tablet, Take by mouth., Disp: , Rfl:    letrozole (FEMARA) 2.5 MG tablet, Take 1 tablet (2.5 mg total) by mouth daily., Disp: 90 tablet, Rfl: 3   Melatonin 1 MG CAPS, Take 1 mg by mouth at bedtime., Disp: , Rfl:    neomycin-polymyxin b-dexamethasone (MAXITROL) 3.5-10000-0.1 SUSP, SMARTSIG:In Eye(s), Disp: , Rfl:    niacinamide 500 MG tablet, Take by mouth., Disp: , Rfl:    polyethylene glycol (MIRALAX / GLYCOLAX) 17 g packet, Take by mouth., Disp: , Rfl:    Red Yeast Rice Extract 600 MG CAPS, Take 600 mg by mouth at bedtime. , Disp: , Rfl:    senna-docusate (SENOKOT-S) 8.6-50 MG tablet, Take by mouth., Disp: , Rfl:    warfarin (COUMADIN) 5 MG tablet, Take 2.5-5 mg  by mouth See admin instructions. Take 2.5 mg at bedtime on Mon and Fri. Take 5 mg at bedtime on Sun, Tue, Wed, Thur, and Sat, Disp: , Rfl:   Past Medical History: Past Medical History:  Diagnosis Date   Allergy    Seasonal   Back pain    Cancer ([place]) 2013   left breast   Chronic atrial fibrillation (HCC)    s/p 3 ablations, multiple cardioversions   Dysrhythmia 1998   A-fib   Hx of basal cell carcinoma 06/24/2018   R ventral  forearm   Hyperlipidemia    Hypertension    Mitral valve regurgitation    Osteoporosis    Personal history of radiation therapy 2017   RIGHT lumpectomy   Sensorineural hearing loss (SNHL) of both ears    worst on right   Vitamin D deficiency     Tobacco Use: Social History   Tobacco Use  Smoking Status Former   Types: Cigarettes   Quit date: 07/02/1966   Years since quitting: 56.1  Smokeless Tobacco Never    Labs: Review Flowsheet        No data to display           Exercise Target Goals: Exercise Program Goal: Individual exercise prescription set using results from initial 6 min walk test and THRR while considering  patient's activity barriers and safety.   Exercise Prescription Goal: Initial exercise prescription builds to 30-45 minutes a day of aerobic activity, 2-3 days per week.  Home exercise guidelines will be given to patient during program as part of exercise prescription that the participant will acknowledge.   Education: Aerobic Exercise: - Group verbal and visual presentation on the components of exercise prescription. Introduces F.I.T.T principle from ACSM for exercise prescriptions.  Reviews F.I.T.T. principles of aerobic exercise including progression. Written material given at graduation.   Education: Resistance Exercise: - Group verbal and visual presentation on the components of exercise prescription. Introduces F.I.T.T principle from ACSM for exercise prescriptions  Reviews F.I.T.T. principles of resistance exercise  including progression. Written material given at graduation.    Education: Exercise & Equipment Safety: - Individual verbal instruction and demonstration of equipment use and safety with use of the equipment. Flowsheet Row Cardiac Rehab from 07/26/2022 in [facility] Cardiac and Pulmonary Rehab  Date 07/26/22  Educator NT  Instruction Review Code 1- Verbalizes Understanding       Education: Exercise Physiology & General Exercise Guidelines: - Group verbal and written instruction with models to review the exercise physiology of the cardiovascular system and associated critical values. Provides general exercise guidelines with specific guidelines to those with heart or lung disease.    Education: Flexibility, Balance, Mind/Body Relaxation: - Group verbal and visual presentation with interactive activity on the components of exercise prescription. Introduces F.I.T.T principle from ACSM for exercise prescriptions. Reviews F.I.T.T. principles of flexibility and balance exercise training including progression. Also discusses the mind body connection.  Reviews various relaxation techniques to help reduce and manage stress (i.e. Deep breathing, progressive muscle relaxation, and visualization). Balance handout provided to take home. Written material given at graduation.   Activity Barriers & Risk Stratification:  Activity Barriers & Cardiac Risk Stratification - 07/26/22 1257       Activity Barriers & Cardiac Risk Stratification   Activity Barriers Back Problems;Arthritis    Cardiac Risk Stratification Moderate             6 Minute Walk:  6 Minute Walk     Row Name 07/26/22 1301         6 Minute Walk   Phase Initial     Distance 1345 feet     Walk Time 6 minutes     # of Rest Breaks 0     MPH 2.55     METS 2.89     RPE 9     Perceived Dyspnea  0     VO2 Peak 10.1     Symptoms No     Resting HR 88 bpm     Resting BP 108/60     Resting Oxygen Saturation  100 %     Exercise  Oxygen Saturation  during 6 min walk 99 %     Max Ex. HR 121 bpm     Max Ex. BP 124/66     2 Minute Post BP 104/58              Oxygen Initial Assessment:   Oxygen Re-Evaluation:   Oxygen Discharge (Final Oxygen Re-Evaluation):   Initial Exercise Prescription:  Initial Exercise Prescription - 07/26/22 1300       Date of Initial Exercise RX and Referring Provider   Date 07/26/22    Referring Provider Dr. Thereasa Distance MD      Oxygen   Maintain Oxygen Saturation 88% or higher      Treadmill  MPH 2    Grade 1    Minutes 15    METs 2.81      NuStep   Level 2    SPM 80    Minutes 15    METs 2.89      REL-XR   Level 2    Watts 15    Speed 50    Minutes 15    METs 2.89      Prescription Details   Frequency (times per week) 2    Duration Progress to 30 minutes of continuous aerobic without signs/symptoms of physical distress      Intensity   THRR 40-80% of Max Heartrate 108-128    Ratings of Perceived Exertion 11-13    Perceived Dyspnea 0-4      Progression   Progression Continue to progress workloads to maintain intensity without signs/symptoms of physical distress.      Resistance Training   Training Prescription Yes    Weight 2 lb    Reps 10-15             Perform Capillary Blood Glucose checks as needed.  Exercise Prescription Changes:   Exercise Prescription Changes     Row Name 07/26/22 1300             Response to Exercise   Blood Pressure (Admit) 108/60       Blood Pressure (Exercise) 124/66       Blood Pressure (Exit) 104/58       Heart Rate (Admit) 88 bpm       Heart Rate (Exercise) 121 bpm       Heart Rate (Exit) 99 bpm       Oxygen Saturation (Admit) 100 %       Oxygen Saturation (Exercise) 99 %       Rating of Perceived Exertion (Exercise) 9       Perceived Dyspnea (Exercise) 0       Symptoms none       Comments 6MWT Results                Exercise Comments:   Exercise Goals and Review:   Exercise  Goals     Row Name 07/26/22 1222             Exercise Goals   Increase Physical Activity Yes       Intervention Provide advice, education, support and counseling about physical activity/exercise needs.;Develop an individualized exercise prescription for aerobic and resistive training based on initial evaluation findings, risk stratification, comorbidities and participant's personal goals.       Expected Outcomes Long Term: Add in home exercise to make exercise part of routine and to increase amount of physical activity.;Short Term: Attend rehab on a regular basis to increase amount of physical activity.;Long Term: Exercising regularly at least 3-5 days a week.       Increase Strength and Stamina Yes       Intervention Provide advice, education, support and counseling about physical activity/exercise needs.;Develop an individualized exercise prescription for aerobic and resistive training based on initial evaluation findings, risk stratification, comorbidities and participant's personal goals.       Expected Outcomes Short Term: Increase workloads from initial exercise prescription for resistance, speed, and METs.;Short Term: Perform resistance training exercises routinely during rehab and add in resistance training at home;Long Term: Improve cardiorespiratory fitness, muscular endurance and strength as measured by increased METs and functional capacity (6MWT)       Able to understand and  use rate of perceived exertion (RPE) scale Yes       Intervention Provide education and explanation on how to use RPE scale       Expected Outcomes Long Term:  Able to use RPE to guide intensity level when exercising independently;Short Term: Able to use RPE daily in rehab to express subjective intensity level       Able to understand and use Dyspnea scale Yes       Intervention Provide education and explanation on how to use Dyspnea scale       Expected Outcomes Short Term: Able to use Dyspnea scale daily in  rehab to express subjective sense of shortness of breath during exertion;Long Term: Able to use Dyspnea scale to guide intensity level when exercising independently       Knowledge and understanding of Target Heart Rate Range (THRR) Yes       Intervention Provide education and explanation of THRR including how the numbers were predicted and where they are located for reference       Expected Outcomes Short Term: Able to state/look up THRR;Long Term: Able to use THRR to govern intensity when exercising independently;Short Term: Able to use daily as guideline for intensity in rehab       Able to check pulse independently Yes       Intervention Provide education and demonstration on how to check pulse in carotid and radial arteries.;Review the importance of being able to check your own pulse for safety during independent exercise       Expected Outcomes Short Term: Able to explain why pulse checking is important during independent exercise;Long Term: Able to check pulse independently and accurately       Understanding of Exercise Prescription Yes       Intervention Provide education, explanation, and written materials on patient's individual exercise prescription       Expected Outcomes Short Term: Able to explain program exercise prescription;Long Term: Able to explain home exercise prescription to exercise independently                Exercise Goals Re-Evaluation :   Discharge Exercise Prescription (Final Exercise Prescription Changes):  Exercise Prescription Changes - 07/26/22 1300       Response to Exercise   Blood Pressure (Admit) 108/60    Blood Pressure (Exercise) 124/66    Blood Pressure (Exit) 104/58    Heart Rate (Admit) 88 bpm    Heart Rate (Exercise) 121 bpm    Heart Rate (Exit) 99 bpm    Oxygen Saturation (Admit) 100 %    Oxygen Saturation (Exercise) 99 %    Rating of Perceived Exertion (Exercise) 9    Perceived Dyspnea (Exercise) 0    Symptoms none    Comments 6MWT  Results             Nutrition:  Target Goals: Understanding of nutrition guidelines, daily intake of sodium '1500mg'$ , cholesterol '200mg'$ , calories 30% from fat and 7% or less from saturated fats, daily to have 5 or more servings of fruits and vegetables.  Education: All About Nutrition: -Group instruction provided by verbal, written material, interactive activities, discussions, models, and posters to present general guidelines for heart healthy nutrition including fat, fiber, MyPlate, the role of sodium in heart healthy nutrition, utilization of the nutrition label, and utilization of this knowledge for meal planning. Follow up email sent as well. Written material given at graduation.   Biometrics:  Pre Biometrics - 07/26/22 1257  Pre Biometrics   Height '5\' 4"'$  (1.626 m)    Weight 113 lb 8 oz (51.5 kg)    Waist Circumference 26 inches    Hip Circumference 34.5 inches    Waist to Hip Ratio 0.75 %    BMI (Calculated) 19.47    Single Leg Stand 30 seconds   L             Nutrition Therapy Plan and Nutrition Goals:  Nutrition Therapy & Goals - 07/26/22 1108       Intervention Plan   Intervention Prescribe, educate and counsel regarding individualized specific dietary modifications aiming towards targeted core components such as weight, hypertension, lipid management, diabetes, heart failure and other comorbidities.    Expected Outcomes Short Term Goal: Understand basic principles of dietary content, such as calories, fat, sodium, cholesterol and nutrients.;Short Term Goal: A plan has been developed with personal nutrition goals set during dietitian appointment.;Long Term Goal: Adherence to prescribed nutrition plan.             Nutrition Assessments:  MEDIFICTS Score Key: ?70 Need to make dietary changes  40-70 Heart Healthy Diet ? 40 Therapeutic Level Cholesterol Diet  Flowsheet Row Cardiac Rehab from 07/26/2022 in [facility] Cardiac and Pulmonary Rehab  Picture Your  Plate Total Score on Admission 73      Picture Your Plate Scores: <77 Unhealthy dietary pattern with much room for improvement. 41-50 Dietary pattern unlikely to meet recommendations for good health and room for improvement. 51-60 More healthful dietary pattern, with some room for improvement.  >60 Healthy dietary pattern, although there may be some specific behaviors that could be improved.    Nutrition Goals Re-Evaluation:   Nutrition Goals Discharge (Final Nutrition Goals Re-Evaluation):   Psychosocial: Target Goals: Acknowledge presence or absence of significant depression and/or stress, maximize coping skills, provide positive support system. Participant is able to verbalize types and ability to use techniques and skills needed for reducing stress and depression.   Education: Stress, Anxiety, and Depression - Group verbal and visual presentation to define topics covered.  Reviews how body is impacted by stress, anxiety, and depression.  Also discusses healthy ways to reduce stress and to treat/manage anxiety and depression.  Written material given at graduation.   Education: Sleep Hygiene -Provides group verbal and written instruction about how sleep can affect your health.  Define sleep hygiene, discuss sleep cycles and impact of sleep habits. Review good sleep hygiene tips.    Initial Review & Psychosocial Screening:  Initial Psych Review & Screening - 07/24/22 1310       Initial Review   Current issues with None Identified      Family Dynamics   Good Support System? Yes   husband, son and family, neighbors     Barriers   Psychosocial barriers to participate in program There are no identifiable barriers or psychosocial needs.      Screening Interventions   Interventions Encouraged to exercise    Expected Outcomes Short Term goal: Utilizing psychosocial counselor, staff and physician to assist with identification of specific Stressors or current issues interfering  with healing process. Setting desired goal for each stressor or current issue identified.;Long Term goal: The participant improves quality of Life and PHQ9 Scores as seen by post scores and/or verbalization of changes;Short Term goal: Identification and review with participant of any Quality of Life or Depression concerns found by scoring the questionnaire.;Long Term Goal: Stressors or current issues are controlled or eliminated.  Quality of Life Scores:   Quality of Life - 07/26/22 1107       Quality of Life   Select Quality of Life      Quality of Life Scores   Health/Function Pre 25.73 %    Socioeconomic Pre 26.25 %    Psych/Spiritual Pre 28.93 %    Family Pre 30 %    GLOBAL Pre 27.1 %            Scores of 19 and below usually indicate a poorer quality of life in these areas.  A difference of  2-3 points is a clinically meaningful difference.  A difference of 2-3 points in the total score of the Quality of Life Index has been associated with significant improvement in overall quality of life, self-image, physical symptoms, and general health in studies assessing change in quality of life.  PHQ-9: Review Flowsheet       07/26/2022 11/28/2016 05/16/2016 12/21/2015  Depression screen PHQ 2/9  Decreased Interest 1 0 0 0  Down, Depressed, Hopeless 0 0 0 0  PHQ - 2 Score 1 0 0 0  Altered sleeping 0 - - -  Tired, decreased energy 1 - - -  Change in appetite 2 - - -  Feeling bad or failure about yourself  0 - - -  Trouble concentrating 0 - - -  Moving slowly or fidgety/restless 0 - - -  Suicidal thoughts 0 - - -  PHQ-9 Score 4 - - -  Difficult doing work/chores Somewhat difficult - - -   Interpretation of Total Score  Total Score Depression Severity:  1-4 = Minimal depression, 5-9 = Mild depression, 10-14 = Moderate depression, 15-19 = Moderately severe depression, 20-27 = Severe depression   Psychosocial Evaluation and Intervention:  Psychosocial Evaluation -  07/24/22 1317       Psychosocial Evaluation & Interventions   Interventions Encouraged to exercise with the program and follow exercise prescription    Comments Mrs. Kinnison is coming to cardiac rehab post TAVR. She did develop anemia which she is still taking medication for. They are also working on getting her warfarin stabilized. She is in chronic Afib and is aware that sometimes her heart rate goes up. She reports no stress or sleep concerns. She has a great support system that includes her husband, son and family, and supportive neighbors. She was doing resistance exercises prior to her TAVR and has started walking again. She wants to work on her stamina and strength.    Expected Outcomes Short: attend cardiac rehab for education and exercise. Long: develop and maintain positive self care habits.    Continue Psychosocial Services  Follow up required by staff             Psychosocial Re-Evaluation:   Psychosocial Discharge (Final Psychosocial Re-Evaluation):   Vocational Rehabilitation: Provide vocational rehab assistance to qualifying candidates.   Vocational Rehab Evaluation & Intervention:  Vocational Rehab - 07/24/22 1320       Initial Vocational Rehab Evaluation & Intervention   Assessment shows need for Vocational Rehabilitation No             Education: Education Goals: Education classes will be provided on a variety of topics geared toward better understanding of heart health and risk factor modification. Participant will state understanding/return demonstration of topics presented as noted by education test scores.  Learning Barriers/Preferences:  Learning Barriers/Preferences - 07/24/22 1310       Learning Barriers/Preferences   Learning Barriers None  Learning Preferences None             General Cardiac Education Topics:  AED/CPR: - Group verbal and written instruction with the use of models to demonstrate the basic use of the AED with the  basic ABC's of resuscitation.   Anatomy and Cardiac Procedures: - Group verbal and visual presentation and models provide information about basic cardiac anatomy and function. Reviews the testing methods done to diagnose heart disease and the outcomes of the test results. Describes the treatment choices: Medical Management, Angioplasty, or Coronary Bypass Surgery for treating various heart conditions including Myocardial Infarction, Angina, Valve Disease, and Cardiac Arrhythmias.  Written material given at graduation.   Medication Safety: - Group verbal and visual instruction to review commonly prescribed medications for heart and lung disease. Reviews the medication, class of the drug, and side effects. Includes the steps to properly store meds and maintain the prescription regimen.  Written material given at graduation.   Intimacy: - Group verbal instruction through game format to discuss how heart and lung disease can affect sexual intimacy. Written material given at graduation..   Know Your Numbers and Heart Failure: - Group verbal and visual instruction to discuss disease risk factors for cardiac and pulmonary disease and treatment options.  Reviews associated critical values for Overweight/Obesity, Hypertension, Cholesterol, and Diabetes.  Discusses basics of heart failure: signs/symptoms and treatments.  Introduces Heart Failure Zone chart for action plan for heart failure.  Written material given at graduation.   Infection Prevention: - Provides verbal and written material to individual with discussion of infection control including proper hand washing and proper equipment cleaning during exercise session. Flowsheet Row Cardiac Rehab from 07/26/2022 in [facility] Cardiac and Pulmonary Rehab  Date 07/26/22  Educator NT  Instruction Review Code 1- Verbalizes Understanding       Falls Prevention: - Provides verbal and written material to individual with discussion of falls prevention and  safety. Flowsheet Row Cardiac Rehab from 07/26/2022 in [facility] Cardiac and Pulmonary Rehab  Date 07/26/22  Educator NT  Instruction Review Code 1- Verbalizes Understanding       Other: -Provides group and verbal instruction on various topics (see comments)   Knowledge Questionnaire Score:  Knowledge Questionnaire Score - 07/26/22 1108       Knowledge Questionnaire Score   Pre Score 23/26             Core Components/Risk Factors/Patient Goals at Admission:  Personal Goals and Risk Factors at Admission - 07/26/22 1108       Core Components/Risk Factors/Patient Goals on Admission    Weight Management Yes;Weight Gain    Intervention Weight Management: Develop a combined nutrition and exercise program designed to reach desired caloric intake, while maintaining appropriate intake of nutrient and fiber, sodium and fats, and appropriate energy expenditure required for the weight goal.;Weight Management: Provide education and appropriate resources to help participant work on and attain dietary goals.    Admit Weight 113 lb 8 oz (51.5 kg)    Goal Weight: Short Term 115 lb (52.2 kg)    Goal Weight: Long Term 120 lb (54.4 kg)    Expected Outcomes Long Term: Adherence to nutrition and physical activity/exercise program aimed toward attainment of established weight goal;Short Term: Continue to assess and modify interventions until short term weight is achieved;Understanding recommendations for meals to include 15-35% energy as protein, 25-35% energy from fat, 35-60% energy from carbohydrates, less than '200mg'$  of dietary cholesterol, 20-35 gm of total fiber daily;Understanding of distribution  of calorie intake throughout the day with the consumption of 4-5 meals/snacks;Weight Gain: Understanding of general recommendations for a high calorie, high protein meal plan that promotes weight gain by distributing calorie intake throughout the day with the consumption for 4-5 meals, snacks, and/or supplements     Hypertension Yes    Intervention Provide education on lifestyle modifcations including regular physical activity/exercise, weight management, moderate sodium restriction and increased consumption of fresh fruit, vegetables, and low fat dairy, alcohol moderation, and smoking cessation.;Monitor prescription use compliance.    Expected Outcomes Short Term: Continued assessment and intervention until BP is < 140/57m HG in hypertensive participants. < 130/863mHG in hypertensive participants with diabetes, heart failure or chronic kidney disease.;Long Term: Maintenance of blood pressure at goal levels.             Education:Diabetes - Individual verbal and written instruction to review signs/symptoms of diabetes, desired ranges of glucose level fasting, after meals and with exercise. Acknowledge that pre and post exercise glucose checks will be done for 3 sessions at entry of program.   Core Components/Risk Factors/Patient Goals Review:    Core Components/Risk Factors/Patient Goals at Discharge (Final Review):    ITP Comments:  ITP Comments     Row Name 07/24/22 1321 07/26/22 1101         ITP Comments Initial phone call completed. [facility] EP Orientation scheduled for Thursday 1/25 at 9:30am. Completed 6MWT and gym orientation. Initial ITP created and sent for review to Dr. MaEmily FilbertMedical Director.               Comments: Initial ITP

## 2022-07-26 NOTE — Patient Instructions (Signed)
Patient Instructions  Patient Details  Name: Sierra Green MRN: [id number] Date of Birth: 1941/04/03 Referring Provider:  Sofie Hartigan, MD  Below are your personal goals for exercise, nutrition, and risk factors. Our goal is to help you stay on track towards obtaining and maintaining these goals. We will be discussing your progress on these goals with you throughout the program.  Initial Exercise Prescription:  Initial Exercise Prescription - 07/26/22 1300       Date of Initial Exercise RX and Referring Provider   Date 07/26/22    Referring Provider Dr. Thereasa Distance MD      Oxygen   Maintain Oxygen Saturation 88% or higher      Treadmill   MPH 2    Grade 1    Minutes 15    METs 2.81      NuStep   Level 2    SPM 80    Minutes 15    METs 2.89      REL-XR   Level 2    Watts 15    Speed 50    Minutes 15    METs 2.89      Prescription Details   Frequency (times per week) 2    Duration Progress to 30 minutes of continuous aerobic without signs/symptoms of physical distress      Intensity   THRR 40-80% of Max Heartrate 108-128    Ratings of Perceived Exertion 11-13    Perceived Dyspnea 0-4      Progression   Progression Continue to progress workloads to maintain intensity without signs/symptoms of physical distress.      Resistance Training   Training Prescription Yes    Weight 2 lb    Reps 10-15             Exercise Goals: Frequency: Be able to perform aerobic exercise two to three times per week in program working toward 2-5 days per week of home exercise.  Intensity: Work with a perceived exertion of 11 (fairly light) - 15 (hard) while following your exercise prescription.  We will make changes to your prescription with you as you progress through the program.   Duration: Be able to do 30 to 45 minutes of continuous aerobic exercise in addition to a 5 minute warm-up and a 5 minute cool-down routine.   Nutrition Goals: Your personal  nutrition goals will be established when you do your nutrition analysis with the dietician.  The following are general nutrition guidelines to follow: Cholesterol < '200mg'$ /day Sodium < '1500mg'$ /day Fiber: Women over 50 yrs - 21 grams per day  Personal Goals:  Personal Goals and Risk Factors at Admission - 07/26/22 1108       Core Components/Risk Factors/Patient Goals on Admission    Weight Management Yes;Weight Gain    Intervention Weight Management: Develop a combined nutrition and exercise program designed to reach desired caloric intake, while maintaining appropriate intake of nutrient and fiber, sodium and fats, and appropriate energy expenditure required for the weight goal.;Weight Management: Provide education and appropriate resources to help participant work on and attain dietary goals.    Admit Weight 113 lb 8 oz (51.5 kg)    Goal Weight: Short Term 115 lb (52.2 kg)    Goal Weight: Long Term 120 lb (54.4 kg)    Expected Outcomes Long Term: Adherence to nutrition and physical activity/exercise program aimed toward attainment of established weight goal;Short Term: Continue to assess and modify interventions until short term weight is achieved;Understanding recommendations  for meals to include 15-35% energy as protein, 25-35% energy from fat, 35-60% energy from carbohydrates, less than '200mg'$  of dietary cholesterol, 20-35 gm of total fiber daily;Understanding of distribution of calorie intake throughout the day with the consumption of 4-5 meals/snacks;Weight Gain: Understanding of general recommendations for a high calorie, high protein meal plan that promotes weight gain by distributing calorie intake throughout the day with the consumption for 4-5 meals, snacks, and/or supplements    Hypertension Yes    Intervention Provide education on lifestyle modifcations including regular physical activity/exercise, weight management, moderate sodium restriction and increased consumption of fresh fruit,  vegetables, and low fat dairy, alcohol moderation, and smoking cessation.;Monitor prescription use compliance.    Expected Outcomes Short Term: Continued assessment and intervention until BP is < 140/45m HG in hypertensive participants. < 130/84mHG in hypertensive participants with diabetes, heart failure or chronic kidney disease.;Long Term: Maintenance of blood pressure at goal levels.            Exercise Goals and Review:  Exercise Goals     Row Name 07/26/22 1222             Exercise Goals   Increase Physical Activity Yes       Intervention Provide advice, education, support and counseling about physical activity/exercise needs.;Develop an individualized exercise prescription for aerobic and resistive training based on initial evaluation findings, risk stratification, comorbidities and participant's personal goals.       Expected Outcomes Long Term: Add in home exercise to make exercise part of routine and to increase amount of physical activity.;Short Term: Attend rehab on a regular basis to increase amount of physical activity.;Long Term: Exercising regularly at least 3-5 days a week.       Increase Strength and Stamina Yes       Intervention Provide advice, education, support and counseling about physical activity/exercise needs.;Develop an individualized exercise prescription for aerobic and resistive training based on initial evaluation findings, risk stratification, comorbidities and participant's personal goals.       Expected Outcomes Short Term: Increase workloads from initial exercise prescription for resistance, speed, and METs.;Short Term: Perform resistance training exercises routinely during rehab and add in resistance training at home;Long Term: Improve cardiorespiratory fitness, muscular endurance and strength as measured by increased METs and functional capacity (6MWT)       Able to understand and use rate of perceived exertion (RPE) scale Yes       Intervention  Provide education and explanation on how to use RPE scale       Expected Outcomes Long Term:  Able to use RPE to guide intensity level when exercising independently;Short Term: Able to use RPE daily in rehab to express subjective intensity level       Able to understand and use Dyspnea scale Yes       Intervention Provide education and explanation on how to use Dyspnea scale       Expected Outcomes Short Term: Able to use Dyspnea scale daily in rehab to express subjective sense of shortness of breath during exertion;Long Term: Able to use Dyspnea scale to guide intensity level when exercising independently       Knowledge and understanding of Target Heart Rate Range (THRR) Yes       Intervention Provide education and explanation of THRR including how the numbers were predicted and where they are located for reference       Expected Outcomes Short Term: Able to state/look up THRR;Long Term: Able to use THRR  to govern intensity when exercising independently;Short Term: Able to use daily as guideline for intensity in rehab       Able to check pulse independently Yes       Intervention Provide education and demonstration on how to check pulse in carotid and radial arteries.;Review the importance of being able to check your own pulse for safety during independent exercise       Expected Outcomes Short Term: Able to explain why pulse checking is important during independent exercise;Long Term: Able to check pulse independently and accurately       Understanding of Exercise Prescription Yes       Intervention Provide education, explanation, and written materials on patient's individual exercise prescription       Expected Outcomes Short Term: Able to explain program exercise prescription;Long Term: Able to explain home exercise prescription to exercise independently

## 2022-08-02 ENCOUNTER — Encounter: Payer: Medicare HMO | Attending: Family Medicine | Admitting: *Deleted

## 2022-08-02 DIAGNOSIS — Z952 Presence of prosthetic heart valve: Secondary | ICD-10-CM | POA: Diagnosis not present

## 2022-08-02 DIAGNOSIS — Z48812 Encounter for surgical aftercare following surgery on the circulatory system: Secondary | ICD-10-CM | POA: Insufficient documentation

## 2022-08-02 NOTE — Progress Notes (Signed)
Daily Session Note  Patient Details  Name: Sierra Green MRN: [id number] Date of Birth: Jan 24, 1941 Referring Provider:   Flowsheet Row Cardiac Rehab from 07/26/2022 in [facility] Cardiac and Pulmonary Rehab  Referring Provider Dr. Thereasa Distance MD       Encounter Date: 08/02/2022  Check In:  Session Check In - 08/02/22 1127       Check-In   Supervising physician immediately available to respond to emergencies See telemetry face sheet for immediately available ER MD    Location [facility]-Cardiac & Pulmonary Rehab    Staff Present Darlyne Russian, RN, ADN;Meredith Sherryll Burger, RN Abel Presto, MS, ACSM CEP, Exercise Physiologist;Joseph Tessie Fass, [place]    Virtual Visit No    Medication changes reported     No    Fall or balance concerns reported    No    Warm-up and Cool-down Performed on first and last piece of equipment    Resistance Training Performed Yes    VAD Patient? No    PAD/SET Patient? No      Pain Assessment   Currently in Pain? No/denies                Social History   Tobacco Use  Smoking Status Former   Types: Cigarettes   Quit date: 07/02/1966   Years since quitting: 56.1  Smokeless Tobacco Never    Goals Met:  Independence with exercise equipment Exercise tolerated well No report of concerns or symptoms today Strength training completed today  Goals Unmet:  Not Applicable  Comments: First full day of exercise!  Patient was oriented to gym and equipment including functions, settings, policies, and procedures.  Patient's individual exercise prescription and treatment plan were reviewed.  All starting workloads were established based on the results of the 6 minute walk test done at initial orientation visit.  The plan for exercise progression was also introduced and progression will be customized based on patient's performance and goals.    Dr. Emily Filbert is Medical Director for [place].  Dr. Ottie Glazier is Medical  Director for [facility] Pulmonary Rehabilitation.

## 2022-08-09 ENCOUNTER — Encounter: Payer: Medicare HMO | Admitting: *Deleted

## 2022-08-09 DIAGNOSIS — Z952 Presence of prosthetic heart valve: Secondary | ICD-10-CM

## 2022-08-09 NOTE — Progress Notes (Signed)
Daily Session Note  Patient Details  Name: Sierra Green MRN: [id number] Date of Birth: Jul 18, 1940 Referring Provider:   Flowsheet Row Cardiac Rehab from 07/26/2022 in [facility] Cardiac and Pulmonary Rehab  Referring Provider Dr. Thereasa Distance MD       Encounter Date: 08/09/2022  Check In:  Session Check In - 08/09/22 1041       Check-In   Supervising physician immediately available to respond to emergencies See telemetry face sheet for immediately available ER MD    Location [facility]-Cardiac & Pulmonary Rehab    Staff Present Renita Papa, RN BSN;Noah Tickle, BS, Exercise Physiologist;Kara Maricela Bo, MS, ACSM CEP, Exercise Physiologist    Virtual Visit No    Medication changes reported     No    Fall or balance concerns reported    No    Warm-up and Cool-down Performed on first and last piece of equipment    Resistance Training Performed Yes    VAD Patient? No    PAD/SET Patient? No      Pain Assessment   Currently in Pain? No/denies                Social History   Tobacco Use  Smoking Status Former   Types: Cigarettes   Quit date: 07/02/1966   Years since quitting: 56.1  Smokeless Tobacco Never    Goals Met:  Independence with exercise equipment Exercise tolerated well No report of concerns or symptoms today Strength training completed today  Goals Unmet:  Not Applicable  Comments: Pt able to follow exercise prescription today without complaint.  Will continue to monitor for progression.    Dr. Emily Filbert is Medical Director for [place].  Dr. Ottie Glazier is Medical Director for [facility] Pulmonary Rehabilitation.

## 2022-08-14 ENCOUNTER — Encounter: Payer: Medicare HMO | Admitting: *Deleted

## 2022-08-14 DIAGNOSIS — Z952 Presence of prosthetic heart valve: Secondary | ICD-10-CM

## 2022-08-14 NOTE — Progress Notes (Signed)
Daily Session Note  Patient Details  Name: Sierra Green MRN: [id number] Date of Birth: Jun 09, 1941 Referring Provider:   Flowsheet Row Cardiac Rehab from 07/26/2022 in [facility] Cardiac and Pulmonary Rehab  Referring Provider Dr. Thereasa Distance MD       Encounter Date: 08/14/2022  Check In:  Session Check In - 08/14/22 1122       Check-In   Supervising physician immediately available to respond to emergencies See telemetry face sheet for immediately available ER MD    Location [facility]-Cardiac & Pulmonary Rehab    Staff Present Renita Papa, RN BSN;Jessica Luan Pulling, MA, RCEP, CCRP, CCET;Noah Tickle, BS, Exercise Physiologist    Virtual Visit No    Medication changes reported     No    Fall or balance concerns reported    No    Warm-up and Cool-down Performed on first and last piece of equipment    Resistance Training Performed Yes    VAD Patient? No    PAD/SET Patient? No      Pain Assessment   Currently in Pain? No/denies                Social History   Tobacco Use  Smoking Status Former   Types: Cigarettes   Quit date: 07/02/1966   Years since quitting: 56.1  Smokeless Tobacco Never    Goals Met:  Independence with exercise equipment Exercise tolerated well No report of concerns or symptoms today Strength training completed today  Goals Unmet:  Not Applicable  Comments: Pt able to follow exercise prescription today without complaint.  Will continue to monitor for progression.    Dr. Emily Filbert is Medical Director for [place].  Dr. Ottie Glazier is Medical Director for [facility] Pulmonary Rehabilitation.

## 2022-08-16 ENCOUNTER — Encounter: Payer: Medicare HMO | Admitting: *Deleted

## 2022-08-16 DIAGNOSIS — Z952 Presence of prosthetic heart valve: Secondary | ICD-10-CM | POA: Diagnosis not present

## 2022-08-16 NOTE — Progress Notes (Signed)
Daily Session Note  Patient Details  Name: Sierra Green MRN: [id number] Date of Birth: [birth year] Referring Provider:   Flowsheet Row Cardiac Rehab from 07/26/2022 in [facility] Cardiac and Pulmonary Rehab  Referring Provider Dr. Thereasa Distance MD       Encounter Date: 08/16/2022  Check In:  Session Check In - 08/16/22 1124       Check-In   Supervising physician immediately available to respond to emergencies See telemetry face sheet for immediately available ER MD    Location [facility]-Cardiac & Pulmonary Rehab    Staff Present Darlyne Russian, RN, ADN;Jessica Luan Pulling, MA, RCEP, CCRP, CCET;Joseph Welling, RCP,RRT,BSRT;Noah [place], [place], Exercise Physiologist    Virtual Visit No    Medication changes reported     No    Fall or balance concerns reported    No    Warm-up and Cool-down Performed on first and last piece of equipment    Resistance Training Performed Yes    VAD Patient? No    PAD/SET Patient? No      Pain Assessment   Currently in Pain? No/denies                Social History   Tobacco Use  Smoking Status Former   Types: Cigarettes   Quit date: 07/02/1966   Years since quitting: 56.1  Smokeless Tobacco Never    Goals Met:  Independence with exercise equipment Exercise tolerated well No report of concerns or symptoms today Strength training completed today  Goals Unmet:  Not Applicable  Comments: Pt able to follow exercise prescription today without complaint.  Will continue to monitor for progression.    Dr. Emily Filbert is Medical Director for [place].  Dr. Ottie Glazier is Medical Director for [facility] Pulmonary Rehabilitation.

## 2022-08-21 ENCOUNTER — Encounter: Payer: Medicare HMO | Admitting: *Deleted

## 2022-08-21 DIAGNOSIS — Z952 Presence of prosthetic heart valve: Secondary | ICD-10-CM | POA: Diagnosis not present

## 2022-08-21 NOTE — Progress Notes (Signed)
Daily Session Note  Patient Details  Name: Sierra Green MRN: [id number] Date of Birth: 08/11/1940 Referring Provider:   Flowsheet Row Cardiac Rehab from 07/26/2022 in [facility] Cardiac and Pulmonary Rehab  Referring Provider Dr. Thereasa Distance MD       Encounter Date: 08/21/2022  Check In:  Session Check In - 08/21/22 1143       Check-In   Supervising physician immediately available to respond to emergencies See telemetry face sheet for immediately available ER MD    Location [facility]-Cardiac & Pulmonary Rehab    Staff Present Nyoka Cowden, RN, BSN, Lauretta Grill, RCP,RRT,BSRT    Virtual Visit No    Medication changes reported     No    Fall or balance concerns reported    No    Tobacco Cessation No Change    Warm-up and Cool-down Performed on first and last piece of equipment    Resistance Training Performed Yes    VAD Patient? No    PAD/SET Patient? No      Pain Assessment   Currently in Pain? No/denies    Multiple Pain Sites No               Exercise Prescription Changes - 08/21/22 1100       Home Exercise Plan   Plans to continue exercise at Home (comment)   rower, walking, bands, stretching   Frequency Add 3 additional days to program exercise sessions.    Initial Home Exercises Provided 08/21/22      Oxygen   Maintain Oxygen Saturation 88% or higher             Social History   Tobacco Use  Smoking Status Former   Types: Cigarettes   Quit date: 07/02/1966   Years since quitting: 56.1  Smokeless Tobacco Never    Goals Met:  Independence with exercise equipment Exercise tolerated well No report of concerns or symptoms today  Goals Unmet:  Not Applicable  Comments: Pt able to follow exercise prescription today without complaint.  Will continue to monitor for progression.    Dr. Emily Filbert is Medical Director for [place].  Dr. Ottie Glazier is Medical Director for [facility] Pulmonary Rehabilitation.

## 2022-08-22 ENCOUNTER — Encounter: Payer: Self-pay | Admitting: *Deleted

## 2022-08-22 DIAGNOSIS — Z952 Presence of prosthetic heart valve: Secondary | ICD-10-CM

## 2022-08-22 NOTE — Progress Notes (Signed)
Cardiac Individual Treatment Plan  Patient Details  Name: Sierra Green MRN: [id number] Date of Birth: [birth year] Referring Provider:   Flowsheet Row Cardiac Rehab from 07/26/2022 in [facility] Cardiac and Pulmonary Rehab  Referring Provider Dr. Thereasa Distance MD       Initial Encounter Date:  Flowsheet Row Cardiac Rehab from 07/26/2022 in [facility] Cardiac and Pulmonary Rehab  Date 07/26/22       Visit Diagnosis: S/P TAVR (transcatheter aortic valve replacement)  Patient's Home Medications on Admission:  Current Outpatient Medications:    Ascorbic Acid (VITAMIN C) 1000 MG tablet, Take 1,000 mg by mouth 4 (four) times a week. [place], Thur, and Sat, Disp: , Rfl:    Cholecalciferol (VITAMIN D3) 5000 units TABS, Take 5,000 Units by mouth every Monday, Tuesday, Wednesday, Thursday, and Friday. , Disp: , Rfl:    clobetasol cream (TEMOVATE) 0.05 %, Apply twice daily up to 2 weeks as needed. Avoid applying to face, groin, and axilla., Disp: 30 g, Rfl: 1   diltiazem (CARDIZEM CD) 120 MG 24 hr capsule, Take by mouth., Disp: , Rfl:    ferrous sulfate 324 (65 Fe) MG TBEC, Take by mouth., Disp: , Rfl:    fexofenadine (ALLEGRA) 180 MG tablet, Take 180 mg by mouth daily as needed for allergies. , Disp: , Rfl:    folic acid (FOLVITE) 1 MG tablet, Take by mouth., Disp: , Rfl:    letrozole (FEMARA) 2.5 MG tablet, Take 1 tablet (2.5 mg total) by mouth daily., Disp: 90 tablet, Rfl: 3   Melatonin 1 MG CAPS, Take 1 mg by mouth at bedtime., Disp: , Rfl:    neomycin-polymyxin b-dexamethasone (MAXITROL) 3.5-10000-0.1 SUSP, SMARTSIG:In Eye(s), Disp: , Rfl:    niacinamide 500 MG tablet, Take by mouth., Disp: , Rfl:    polyethylene glycol (MIRALAX / GLYCOLAX) 17 g packet, Take by mouth., Disp: , Rfl:    Red Yeast Rice Extract 600 MG CAPS, Take 600 mg by mouth at bedtime. , Disp: , Rfl:    senna-docusate (SENOKOT-S) 8.6-50 MG tablet, Take by mouth., Disp: , Rfl:    warfarin (COUMADIN) 5 MG tablet, Take 2.5-5 mg  by mouth See admin instructions. Take 2.5 mg at bedtime on Mon and Fri. Take 5 mg at bedtime on Sun, Tue, Wed, Thur, and Sat, Disp: , Rfl:   Past Medical History: Past Medical History:  Diagnosis Date   Allergy    Seasonal   Back pain    Cancer ([place]) 2013   left breast   Chronic atrial fibrillation (HCC)    s/p 3 ablations, multiple cardioversions   Dysrhythmia 1998   A-fib   Hx of basal cell carcinoma 06/24/2018   R ventral  forearm   Hyperlipidemia    Hypertension    Mitral valve regurgitation    Osteoporosis    Personal history of radiation therapy 2017   RIGHT lumpectomy   Sensorineural hearing loss (SNHL) of both ears    worst on right   Vitamin D deficiency     Tobacco Use: Social History   Tobacco Use  Smoking Status Former   Types: Cigarettes   Quit date: 07/02/1966   Years since quitting: 56.1  Smokeless Tobacco Never    Labs: Review Flowsheet        No data to display           Exercise Target Goals: Exercise Program Goal: Individual exercise prescription set using results from initial 6 min walk test and THRR while considering  patient's activity barriers and safety.   Exercise Prescription Goal: Initial exercise prescription builds to 30-45 minutes a day of aerobic activity, 2-3 days per week.  Home exercise guidelines will be given to patient during program as part of exercise prescription that the participant will acknowledge.   Education: Aerobic Exercise: - Group verbal and visual presentation on the components of exercise prescription. Introduces F.I.T.T principle from ACSM for exercise prescriptions.  Reviews F.I.T.T. principles of aerobic exercise including progression. Written material given at graduation.   Education: Resistance Exercise: - Group verbal and visual presentation on the components of exercise prescription. Introduces F.I.T.T principle from ACSM for exercise prescriptions  Reviews F.I.T.T. principles of resistance exercise  including progression. Written material given at graduation.    Education: Exercise & Equipment Safety: - Individual verbal instruction and demonstration of equipment use and safety with use of the equipment. Flowsheet Row Cardiac Rehab from 08/16/2022 in [facility] Cardiac and Pulmonary Rehab  Date 07/26/22  Educator NT  Instruction Review Code 1- Verbalizes Understanding       Education: Exercise Physiology & General Exercise Guidelines: - Group verbal and written instruction with models to review the exercise physiology of the cardiovascular system and associated critical values. Provides general exercise guidelines with specific guidelines to those with heart or lung disease.    Education: Flexibility, Balance, Mind/Body Relaxation: - Group verbal and visual presentation with interactive activity on the components of exercise prescription. Introduces F.I.T.T principle from ACSM for exercise prescriptions. Reviews F.I.T.T. principles of flexibility and balance exercise training including progression. Also discusses the mind body connection.  Reviews various relaxation techniques to help reduce and manage stress (i.e. Deep breathing, progressive muscle relaxation, and visualization). Balance handout provided to take home. Written material given at graduation. Flowsheet Row Cardiac Rehab from 08/16/2022 in [facility] Cardiac and Pulmonary Rehab  Date 08/02/22  Educator [facility]  Instruction Review Code 1- Verbalizes Understanding       Activity Barriers & Risk Stratification:  Activity Barriers & Cardiac Risk Stratification - 07/26/22 1257       Activity Barriers & Cardiac Risk Stratification   Activity Barriers Back Problems;Arthritis    Cardiac Risk Stratification Moderate             6 Minute Walk:  6 Minute Walk     Row Name 07/26/22 1301         6 Minute Walk   Phase Initial     Distance 1345 feet     Walk Time 6 minutes     # of Rest Breaks 0     MPH 2.55     METS 2.89      RPE 9     Perceived Dyspnea  0     VO2 Peak 10.1     Symptoms No     Resting HR 88 bpm     Resting BP 108/60     Resting Oxygen Saturation  100 %     Exercise Oxygen Saturation  during 6 min walk 99 %     Max Ex. HR 121 bpm     Max Ex. BP 124/66     2 Minute Post BP 104/58              Oxygen Initial Assessment:   Oxygen Re-Evaluation:   Oxygen Discharge (Final Oxygen Re-Evaluation):   Initial Exercise Prescription:  Initial Exercise Prescription - 07/26/22 1300       Date of Initial Exercise RX and Referring Provider   Date 07/26/22  Referring Provider Dr. Thereasa Distance MD      Oxygen   Maintain Oxygen Saturation 88% or higher      Treadmill   MPH 2    Grade 1    Minutes 15    METs 2.81      NuStep   Level 2    SPM 80    Minutes 15    METs 2.89      REL-XR   Level 2    Watts 15    Speed 50    Minutes 15    METs 2.89      Prescription Details   Frequency (times per week) 2    Duration Progress to 30 minutes of continuous aerobic without signs/symptoms of physical distress      Intensity   THRR 40-80% of Max Heartrate 108-128    Ratings of Perceived Exertion 11-13    Perceived Dyspnea 0-4      Progression   Progression Continue to progress workloads to maintain intensity without signs/symptoms of physical distress.      Resistance Training   Training Prescription Yes    Weight 2 lb    Reps 10-15             Perform Capillary Blood Glucose checks as needed.  Exercise Prescription Changes:   Exercise Prescription Changes     Row Name 07/26/22 1300 08/06/22 1100 08/20/22 1300 08/21/22 1100       Response to Exercise   Blood Pressure (Admit) 108/60 106/64 124/62 --    Blood Pressure (Exercise) 124/66 122/68 128/58 --    Blood Pressure (Exit) 104/58 108/58 102/56 --    Heart Rate (Admit) 88 bpm 103 bpm 95 bpm --    Heart Rate (Exercise) 121 bpm 125 bpm 147 bpm --    Heart Rate (Exit) 99 bpm 99 bpm 111 bpm --    Oxygen  Saturation (Admit) 100 % -- -- --    Oxygen Saturation (Exercise) 99 % -- -- --    Rating of Perceived Exertion (Exercise) 9 12 13 $ --    Perceived Dyspnea (Exercise) 0 -- -- --    Symptoms none none none --    Comments 6MWT Results 1st full day of exercise -- --    Duration -- Progress to 30 minutes of  aerobic without signs/symptoms of physical distress Continue with 30 min of aerobic exercise without signs/symptoms of physical distress. --    Intensity -- THRR unchanged THRR unchanged --      Progression   Progression -- Continue to progress workloads to maintain intensity without signs/symptoms of physical distress. Continue to progress workloads to maintain intensity without signs/symptoms of physical distress. --    Average METs -- 2.5 3.69 --      Resistance Training   Training Prescription -- Yes Yes --    Weight -- 2 lb 2 lb --    Reps -- 10-15 10-15 --      Interval Training   Interval Training -- No No --      Treadmill   MPH -- 2.1 3 --    Grade -- 1 2.5 --    Minutes -- 15 15 --    METs -- 2.9 4.33 --      NuStep   Level -- 2 6 --    Minutes -- 15 15 --    METs -- 2.1 3 --      REL-XR   Level -- -- 3 --  Minutes -- -- 15 --    METs -- -- 3.5 --      Home Exercise Plan   Plans to continue exercise at -- -- -- Home (comment)  rower, walking, bands, stretching    Frequency -- -- -- Add 3 additional days to program exercise sessions.    Initial Home Exercises Provided -- -- -- 08/21/22      Oxygen   Maintain Oxygen Saturation -- 88% or higher 88% or higher 88% or higher             Exercise Comments:   Exercise Comments     Row Name 08/02/22 1128           Exercise Comments First full day of exercise!  Patient was oriented to gym and equipment including functions, settings, policies, and procedures.  Patient's individual exercise prescription and treatment plan were reviewed.  All starting workloads were established based on the results of the 6  minute walk test done at initial orientation visit.  The plan for exercise progression was also introduced and progression will be customized based on patient's performance and goals.                Exercise Goals and Review:   Exercise Goals     Row Name 07/26/22 1222             Exercise Goals   Increase Physical Activity Yes       Intervention Provide advice, education, support and counseling about physical activity/exercise needs.;Develop an individualized exercise prescription for aerobic and resistive training based on initial evaluation findings, risk stratification, comorbidities and participant's personal goals.       Expected Outcomes Long Term: Add in home exercise to make exercise part of routine and to increase amount of physical activity.;Short Term: Attend rehab on a regular basis to increase amount of physical activity.;Long Term: Exercising regularly at least 3-5 days a week.       Increase Strength and Stamina Yes       Intervention Provide advice, education, support and counseling about physical activity/exercise needs.;Develop an individualized exercise prescription for aerobic and resistive training based on initial evaluation findings, risk stratification, comorbidities and participant's personal goals.       Expected Outcomes Short Term: Increase workloads from initial exercise prescription for resistance, speed, and METs.;Short Term: Perform resistance training exercises routinely during rehab and add in resistance training at home;Long Term: Improve cardiorespiratory fitness, muscular endurance and strength as measured by increased METs and functional capacity (6MWT)       Able to understand and use rate of perceived exertion (RPE) scale Yes       Intervention Provide education and explanation on how to use RPE scale       Expected Outcomes Long Term:  Able to use RPE to guide intensity level when exercising independently;Short Term: Able to use RPE daily in rehab  to express subjective intensity level       Able to understand and use Dyspnea scale Yes       Intervention Provide education and explanation on how to use Dyspnea scale       Expected Outcomes Short Term: Able to use Dyspnea scale daily in rehab to express subjective sense of shortness of breath during exertion;Long Term: Able to use Dyspnea scale to guide intensity level when exercising independently       Knowledge and understanding of Target Heart Rate Range (THRR) Yes       Intervention  Provide education and explanation of THRR including how the numbers were predicted and where they are located for reference       Expected Outcomes Short Term: Able to state/look up THRR;Long Term: Able to use THRR to govern intensity when exercising independently;Short Term: Able to use daily as guideline for intensity in rehab       Able to check pulse independently Yes       Intervention Provide education and demonstration on how to check pulse in carotid and radial arteries.;Review the importance of being able to check your own pulse for safety during independent exercise       Expected Outcomes Short Term: Able to explain why pulse checking is important during independent exercise;Long Term: Able to check pulse independently and accurately       Understanding of Exercise Prescription Yes       Intervention Provide education, explanation, and written materials on patient's individual exercise prescription       Expected Outcomes Short Term: Able to explain program exercise prescription;Long Term: Able to explain home exercise prescription to exercise independently                Exercise Goals Re-Evaluation :  Exercise Goals Re-Evaluation     Row Name 08/02/22 1128 08/06/22 1139 08/20/22 1400 08/21/22 1126       Exercise Goal Re-Evaluation   Exercise Goals Review Able to understand and use rate of perceived exertion (RPE) scale;Able to understand and use Dyspnea scale;Knowledge and understanding of  Target Heart Rate Range (THRR);Understanding of Exercise Prescription Understanding of Exercise Prescription;Increase Physical Activity;Increase Strength and Stamina Understanding of Exercise Prescription;Increase Physical Activity;Increase Strength and Stamina Understanding of Exercise Prescription;Increase Physical Activity;Increase Strength and Stamina;Able to understand and use rate of perceived exertion (RPE) scale;Knowledge and understanding of Target Heart Rate Range (THRR);Able to check pulse independently    Comments Reviewed RPE scale, THR and program prescription with pt today.  Pt voiced understanding and was given a copy of goals to take home. Jeidy is off to a good start with rehab with completing her first session so far. She was able to exercise at her initial exercise prescription well with appropriate RPEs. We will continue to monitor as she progresses in the program. Sameerah is doing well in rehab. She increased her overall average MET level to 3.69 METs. She also increased her treadmill workload to a speed of 3 mph and an incline of 2.5%. She improved to level 3 on the XR machine as well. We will continue to monitor her progress in the program. Reviewed home exercise with pt today.  Pt plans to use her rower and walk at home for exercise. She is also using resistance bands and stretching at home. Patient states she is only exercising about 20 minutes in total for aerobic, we discussed importance of increasing to 30 minutes. Reviewed THR, pulse, RPE, sign and symptoms, pulse oximetery and when to call 911 or MD.  Also discussed weather considerations and indoor options.  Pt voiced understanding.    Expected Outcomes Short: Use RPE daily to regulate intensity. Long: Follow program prescription in THR. Short: Contiue to follow initial exercise prescription Long: Build up overall strength and stamina Short: Continue to increase workloads on treadmill and seated machines. Long: Continue to  improve strength and stamina. Short: Increase aerobic exercise to 30 minutes Long: Continue to exercise independently             Discharge Exercise Prescription (Final Exercise Prescription Changes):  Exercise Prescription Changes - 08/21/22 1100       Home Exercise Plan   Plans to continue exercise at Home (comment)   rower, walking, bands, stretching   Frequency Add 3 additional days to program exercise sessions.    Initial Home Exercises Provided 08/21/22      Oxygen   Maintain Oxygen Saturation 88% or higher             Nutrition:  Target Goals: Understanding of nutrition guidelines, daily intake of sodium <1512m, cholesterol <2069m calories 30% from fat and 7% or less from saturated fats, daily to have 5 or more servings of fruits and vegetables.  Education: All About Nutrition: -Group instruction provided by verbal, written material, interactive activities, discussions, models, and posters to present general guidelines for heart healthy nutrition including fat, fiber, MyPlate, the role of sodium in heart healthy nutrition, utilization of the nutrition label, and utilization of this knowledge for meal planning. Follow up email sent as well. Written material given at graduation.   Biometrics:  Pre Biometrics - 07/26/22 1257       Pre Biometrics   Height 5' 4"$  (1.626 m)    Weight 113 lb 8 oz (51.5 kg)    Waist Circumference 26 inches    Hip Circumference 34.5 inches    Waist to Hip Ratio 0.75 %    BMI (Calculated) 19.47    Single Leg Stand 30 seconds   L             Nutrition Therapy Plan and Nutrition Goals:  Nutrition Therapy & Goals - 07/26/22 1108       Intervention Plan   Intervention Prescribe, educate and counsel regarding individualized specific dietary modifications aiming towards targeted core components such as weight, hypertension, lipid management, diabetes, heart failure and other comorbidities.    Expected Outcomes Short Term Goal:  Understand basic principles of dietary content, such as calories, fat, sodium, cholesterol and nutrients.;Short Term Goal: A plan has been developed with personal nutrition goals set during dietitian appointment.;Long Term Goal: Adherence to prescribed nutrition plan.             Nutrition Assessments:  MEDIFICTS Score Key: ?70 Need to make dietary changes  40-70 Heart Healthy Diet ? 40 Therapeutic Level Cholesterol Diet  [facility]  Picture Your Plate Total Score on Admission 73      Picture Your Plate Scores: <[id number] dietary pattern with much room for improvement. 41-50 Dietary pattern unlikely to meet recommendations for good health and room for improvement. 51-60 More healthful dietary pattern, with some room for improvement.  >60 Healthy dietary pattern, although there may be some specific behaviors that could be improved.    Nutrition Goals Re-Evaluation:  Nutrition Goals Re-Evaluation     [place] 08/16/22 1130             Goals   Current Weight 115 lb (52.2 kg)       Nutrition Goal Gain some weight       Comment Patient does not want to meet with RD.                Nutrition Goals Discharge (Final Nutrition Goals Re-Evaluation):  Nutrition Goals Re-Evaluation - 08/16/22 1130       Goals   Current Weight 115 lb (52.2 kg)    Nutrition Goal Gain some weight    Comment Patient does not want to meet with RD.  Psychosocial: Target Goals: Acknowledge presence or absence of significant depression and/or stress, maximize coping skills, provide positive support system. Participant is able to verbalize types and ability to use techniques and skills needed for reducing stress and depression.   Education: Stress, Anxiety, and Depression - Group verbal and visual presentation to define topics covered.  Reviews how body is impacted by stress, anxiety, and depression.  Also  discusses healthy ways to reduce stress and to treat/manage anxiety and depression.  Written material given at graduation.   Education: Sleep Hygiene -Provides group verbal and written instruction about how sleep can affect your health.  Define sleep hygiene, discuss sleep cycles and impact of sleep habits. Review good sleep hygiene tips.    Initial Review & Psychosocial Screening:  Initial Psych Review & Screening - 07/24/22 1310       Initial Review   Current issues with None Identified      Family Dynamics   Good Support System? Yes   husband, son and family, neighbors     Barriers   Psychosocial barriers to participate in program There are no identifiable barriers or psychosocial needs.      Screening Interventions   Interventions Encouraged to exercise    Expected Outcomes Short Term goal: Utilizing psychosocial counselor, staff and physician to assist with identification of specific Stressors or current issues interfering with healing process. Setting desired goal for each stressor or current issue identified.;Long Term goal: The participant improves quality of Life and PHQ9 Scores as seen by post scores and/or verbalization of changes;Short Term goal: Identification and review with participant of any Quality of Life or Depression concerns found by scoring the questionnaire.;Long Term Goal: Stressors or current issues are controlled or eliminated.             Quality of Life Scores:   Quality of Life - 07/26/22 1107       Quality of Life   Select Quality of Life      Quality of Life Scores   Health/Function Pre 25.73 %    Socioeconomic Pre 26.25 %    Psych/Spiritual Pre 28.93 %    Family Pre 30 %    GLOBAL Pre 27.1 %            Scores of 19 and below usually indicate a poorer quality of life in these areas.  A difference of  2-3 points is a clinically meaningful difference.  A difference of 2-3 points in the total score of the Quality of Life Index has been  associated with significant improvement in overall quality of life, self-image, physical symptoms, and general health in studies assessing change in quality of life.  PHQ-9: Review Flowsheet       07/26/2022 11/28/2016 05/16/2016 12/21/2015  Depression screen PHQ 2/9  Decreased Interest 1 0 0 0  Down, Depressed, Hopeless 0 0 0 0  PHQ - 2 Score 1 0 0 0  Altered sleeping 0 - - -  Tired, decreased energy 1 - - -  Change in appetite 2 - - -  Feeling bad or failure about yourself  0 - - -  Trouble concentrating 0 - - -  Moving slowly or fidgety/restless 0 - - -  Suicidal thoughts 0 - - -  PHQ-9 Score 4 - - -  Difficult doing work/chores Somewhat difficult - - -   Interpretation of Total Score  Total Score Depression Severity:  1-4 = Minimal depression, 5-9 = Mild depression, 10-14 = Moderate depression, 15-19 =  Moderately severe depression, 20-27 = Severe depression   Psychosocial Evaluation and Intervention:  Psychosocial Evaluation - 07/24/22 1317       Psychosocial Evaluation & Interventions   Interventions Encouraged to exercise with the program and follow exercise prescription    Comments Mrs. Marbury is coming to cardiac rehab post TAVR. She did develop anemia which she is still taking medication for. They are also working on getting her warfarin stabilized. She is in chronic Afib and is aware that sometimes her heart rate goes up. She reports no stress or sleep concerns. She has a great support system that includes her husband, son and family, and supportive neighbors. She was doing resistance exercises prior to her TAVR and has started walking again. She wants to work on her stamina and strength.    Expected Outcomes Short: attend cardiac rehab for education and exercise. Long: develop and maintain positive self care habits.    Continue Psychosocial Services  Follow up required by staff             Psychosocial Re-Evaluation:  Psychosocial Re-Evaluation     [place]ent[place] Name  08/16/22 1131             Psychosocial Re-Evaluation   Current issues with None Identified       Comments Patient reports no issues with their current mental states, sleep, stress, depression or anxiety. Will follow up with patient in a few weeks for any changes.       Expected Outcomes Short: Continue to exercise regularly to support mental health and notify staff of any changes. Long: maintain mental health and well being through teaching of rehab or prescribed medications independently.       Interventions Encouraged to attend Cardiac Rehabilitation for the exercise       Continue Psychosocial Services  Follow up required by staff                Psychosocial Discharge (Final Psychosocial Re-Evaluation):  Psychosocial Re-Evaluation - 08/16/22 1131       Psychosocial Re-Evaluation   Current issues with None Identified    Comments Patient reports no issues with their current mental states, sleep, stress, depression or anxiety. Will follow up with patient in a few weeks for any changes.    Expected Outcomes Short: Continue to exercise regularly to support mental health and notify staff of any changes. Long: maintain mental health and well being through teaching of rehab or prescribed medications independently.    Interventions Encouraged to attend Cardiac Rehabilitation for the exercise    Continue Psychosocial Services  Follow up required by staff             Vocational Rehabilitation: Provide vocational rehab assistance to qualifying candidates.   Vocational Rehab Evaluation & Intervention:  Vocational Rehab - 07/24/22 1320       Initial Vocational Rehab Evaluation & Intervention   Assessment shows need for Vocational Rehabilitation No             Education: Education Goals: Education classes will be provided on a variety of topics geared toward better understanding of heart health and risk factor modification. Participant will state understanding/return  demonstration of topics presented as noted by education test scores.  Learning Barriers/Preferences:  Learning Barriers/Preferences - 07/24/22 1310       Learning Barriers/Preferences   Learning Barriers None    Learning Preferences None             General Cardiac Education Topics:  AED/CPR: -  Group verbal and written instruction with the use of models to demonstrate the basic use of the AED with the basic ABC's of resuscitation.   Anatomy and Cardiac Procedures: - Group verbal and visual presentation and models provide information about basic cardiac anatomy and function. Reviews the testing methods done to diagnose heart disease and the outcomes of the test results. Describes the treatment choices: Medical Management, Angioplasty, or Coronary Bypass Surgery for treating various heart conditions including Myocardial Infarction, Angina, Valve Disease, and Cardiac Arrhythmias.  Written material given at graduation. Flowsheet Row Cardiac Rehab from 08/16/2022 in [facility] Cardiac and Pulmonary Rehab  Date 08/09/22  Educator SB  Instruction Review Code 1- Verbalizes Understanding       Medication Safety: - Group verbal and visual instruction to review commonly prescribed medications for heart and lung disease. Reviews the medication, class of the drug, and side effects. Includes the steps to properly store meds and maintain the prescription regimen.  Written material given at graduation.   Intimacy: - Group verbal instruction through game format to discuss how heart and lung disease can affect sexual intimacy. Written material given at graduation..   Know Your Numbers and Heart Failure: - Group verbal and visual instruction to discuss disease risk factors for cardiac and pulmonary disease and treatment options.  Reviews associated critical values for Overweight/Obesity, Hypertension, Cholesterol, and Diabetes.  Discusses basics of heart failure: signs/symptoms and treatments.   Introduces Heart Failure Zone chart for action plan for heart failure.  Written material given at graduation.   Infection Prevention: - Provides verbal and written material to individual with discussion of infection control including proper hand washing and proper equipment cleaning during exercise session. Flowsheet Row Cardiac Rehab from 08/16/2022 in [facility] Cardiac and Pulmonary Rehab  Date 07/26/22  Educator NT  Instruction Review Code 1- Verbalizes Understanding       Falls Prevention: - Provides verbal and written material to individual with discussion of falls prevention and safety. Flowsheet Row Cardiac Rehab from 08/16/2022 in [facility] Cardiac and Pulmonary Rehab  Date 07/26/22  Educator NT  Instruction Review Code 1- Verbalizes Understanding       Other: -Provides group and verbal instruction on various topics (see comments) Flowsheet Row Cardiac Rehab from 08/16/2022 in [facility] Cardiac and Pulmonary Rehab  Date 08/16/22  Educator SB  Instruction Review Code 1- Verbalizes Understanding       Knowledge Questionnaire Score:  Knowledge Questionnaire Score - 07/26/22 1108       Knowledge Questionnaire Score   Pre Score 23/26             Core Components/Risk Factors/Patient Goals at Admission:  Personal Goals and Risk Factors at Admission - 07/26/22 1108       Core Components/Risk Factors/Patient Goals on Admission    Weight Management Yes;Weight Gain    Intervention Weight Management: Develop a combined nutrition and exercise program designed to reach desired caloric intake, while maintaining appropriate intake of nutrient and fiber, sodium and fats, and appropriate energy expenditure required for the weight goal.;Weight Management: Provide education and appropriate resources to help participant work on and attain dietary goals.    Admit Weight 113 lb 8 oz (51.5 kg)    Goal Weight: Short Term 115 lb (52.2 kg)    Goal Weight: Long Term 120 lb (54.4 kg)    Expected  Outcomes Long Term: Adherence to nutrition and physical activity/exercise program aimed toward attainment of established weight goal;Short Term: Continue to assess and modify interventions until  short term weight is achieved;Understanding recommendations for meals to include 15-35% energy as protein, 25-35% energy from fat, 35-60% energy from carbohydrates, less than 221m of dietary cholesterol, 20-35 gm of total fiber daily;Understanding of distribution of calorie intake throughout the day with the consumption of 4-5 meals/snacks;Weight Gain: Understanding of general recommendations for a high calorie, high protein meal plan that promotes weight gain by distributing calorie intake throughout the day with the consumption for 4-5 meals, snacks, and/or supplements    Hypertension Yes    Intervention Provide education on lifestyle modifcations including regular physical activity/exercise, weight management, moderate sodium restriction and increased consumption of fresh fruit, vegetables, and low fat dairy, alcohol moderation, and smoking cessation.;Monitor prescription use compliance.    Expected Outcomes Short Term: Continued assessment and intervention until BP is < 140/92mHG in hypertensive participants. < 130/8042mG in hypertensive participants with diabetes, heart failure or chronic kidney disease.;Long Term: Maintenance of blood pressure at goal levels.             Education:Diabetes - Individual verbal and written instruction to review signs/symptoms of diabetes, desired ranges of glucose level fasting, after meals and with exercise. Acknowledge that pre and post exercise glucose checks will be done for 3 sessions at entry of program.   Core Components/Risk Factors/Patient Goals Review:   Goals and Risk Factor Review     Row Name 08/16/22 1127             Core Components/Risk Factors/Patient Goals Review   Personal Goals Review Weight Management/Obesity;Other       Review ChrDoreeneas been doing well in tin the program. She does not have any questions about her medications. She would like to gain a little weight and some muscle. Blood pressures have been stable and she checks her blood pressure at home occasionaly.       Expected Outcomes Short: gain a few poinds in a few months. Long: maintain weight gain.                Core Components/Risk Factors/Patient Goals at Discharge (Final Review):   Goals and Risk Factor Review - 08/16/22 1127       Core Components/Risk Factors/Patient Goals Review   Personal Goals Review Weight Management/Obesity;Other    Review ChrInikas been doing well in tin the program. She does not have any questions about her medications. She would like to gain a little weight and some muscle. Blood pressures have been stable and she checks her blood pressure at home occasionaly.    Expected Outcomes Short: gain a few poinds in a few months. Long: maintain weight gain.             ITP Comments:  ITP Comments     Row Name 07/24/22 1321 07/26/22 1101 08/02/22 1128 08/22/22 1421     ITP Comments Initial phone call completed. [facility] EP Orientation scheduled for Thursday 1/25 at 9:30am. Completed 6MWT and gym orientation. Initial ITP created and sent for review to Dr. MarEmily Filbertedical Director. First full day of exercise!  Patient was oriented to gym and equipment including functions, settings, policies, and procedures.  Patient's individual exercise prescription and treatment plan were reviewed.  All starting workloads were established based on the results of the 6 minute walk test done at initial orientation visit.  The plan for exercise progression was also introduced and progression will be customized based on patient's performance and goals. 30 day review completed.  ITP sent to Dr. Emily Filbert, Medical Director of Cardiac Rehab. Continue with ITP unless changes are made by physician.              Comments: 30 day review

## 2022-08-23 ENCOUNTER — Encounter: Payer: Self-pay | Admitting: Dermatology

## 2022-08-23 ENCOUNTER — Ambulatory Visit: Payer: Medicare HMO | Admitting: Dermatology

## 2022-08-23 VITALS — BP 118/76 | HR 92

## 2022-08-23 DIAGNOSIS — L814 Other melanin hyperpigmentation: Secondary | ICD-10-CM

## 2022-08-23 DIAGNOSIS — L578 Other skin changes due to chronic exposure to nonionizing radiation: Secondary | ICD-10-CM

## 2022-08-23 DIAGNOSIS — L720 Epidermal cyst: Secondary | ICD-10-CM

## 2022-08-23 DIAGNOSIS — D485 Neoplasm of uncertain behavior of skin: Secondary | ICD-10-CM | POA: Diagnosis not present

## 2022-08-23 DIAGNOSIS — L570 Actinic keratosis: Secondary | ICD-10-CM

## 2022-08-23 DIAGNOSIS — Z85828 Personal history of other malignant neoplasm of skin: Secondary | ICD-10-CM | POA: Diagnosis not present

## 2022-08-23 DIAGNOSIS — L820 Inflamed seborrheic keratosis: Secondary | ICD-10-CM | POA: Diagnosis not present

## 2022-08-23 DIAGNOSIS — D229 Melanocytic nevi, unspecified: Secondary | ICD-10-CM

## 2022-08-23 DIAGNOSIS — Z1283 Encounter for screening for malignant neoplasm of skin: Secondary | ICD-10-CM

## 2022-08-23 DIAGNOSIS — L821 Other seborrheic keratosis: Secondary | ICD-10-CM

## 2022-08-23 DIAGNOSIS — D492 Neoplasm of unspecified behavior of bone, soft tissue, and skin: Secondary | ICD-10-CM

## 2022-08-23 NOTE — Progress Notes (Signed)
Follow-Up Visit   Subjective  Sierra Green is a 82 y.o. female who presents for the following: Annual Exam (Hx of BCC. Areas of concern on back, itching, irritated).  The following portions of the chart were reviewed this encounter and updated as appropriate:  Tobacco  Allergies  Meds  Problems  Med Hx  Surg Hx  Fam Hx      Review of Systems: No other skin or systemic complaints except as noted in HPI or Assessment and Plan.   Objective  Well appearing patient in no apparent distress; mood and affect are within normal limits.  A full examination was performed including scalp, head, eyes, ears, nose, lips, neck, chest, axillae, abdomen, back, buttocks, bilateral upper extremities, bilateral lower extremities, hands, feet, fingers, toes, fingernails, and toenails. All findings within normal limits unless otherwise noted below.  Left Thigh - Posterior Subcutaneous nodule.   Right Dorsal Forearm 1.0 cm tan to brown patch      Left Nasal Sidewall x1, right zygoma x1 (2) Erythematous thin papules/macules with gritty scale.   Right Shoulder - Posterior x2 (2) Erythematous keratotic or waxy stuck-on papule or plaque.   Assessment & Plan   History of Basal Cell Carcinoma of the Skin - No evidence of recurrence today - Recommend regular full body skin exams - Recommend daily broad spectrum sunscreen SPF 30+ to sun-exposed areas, reapply every 2 hours as needed.  - Call if any new or changing lesions are noted between office visits   Lentigines - Scattered tan macules - Due to sun exposure - Benign-appearing, observe - Recommend daily broad spectrum sunscreen SPF 30+ to sun-exposed areas, reapply every 2 hours as needed. - Call for any changes  Seborrheic Keratoses - Stuck-on, waxy, tan-brown papules and/or plaques  - Benign-appearing - Discussed benign etiology and prognosis. - Observe - Call for any changes  Melanocytic Nevi - Tan-brown and/or  pink-flesh-colored symmetric macules and papules - Benign appearing on exam today - Observation - Call clinic for new or changing moles - Recommend daily use of broad spectrum spf 30+ sunscreen to sun-exposed areas.   Hemangiomas - Red papules - Discussed benign nature - Observe - Call for any changes  Actinic Damage - Chronic condition, secondary to cumulative UV/sun exposure - diffuse scaly erythematous macules with underlying dyspigmentation - Recommend daily broad spectrum sunscreen SPF 30+ to sun-exposed areas, reapply every 2 hours as needed.  - Staying in the shade or wearing long sleeves, sun glasses (UVA+UVB protection) and wide brim hats (4-inch brim around the entire circumference of the hat) are also recommended for sun protection.  - Call for new or changing lesions.  Skin cancer screening performed today.  Epidermal inclusion cyst Left Thigh - Posterior  Benign-appearing. Exam most consistent with an epidermal inclusion cyst. Discussed that a cyst is a benign growth that can grow over time and sometimes get irritated or inflamed. Recommend observation if it is not bothersome. Discussed option of surgical excision to remove it if it is growing, symptomatic, or other changes noted. Please call for new or changing lesions so they can be evaluated.    Neoplasm of skin Right Dorsal Forearm  Skin / nail biopsy Type of biopsy: tangential   Informed consent: discussed and consent obtained   Anesthesia: the lesion was anesthetized in a standard fashion   Anesthesia comment:  Area prepped with alcohol Anesthetic:  1% lidocaine w/ epinephrine 1-100,000 buffered w/ 8.4% NaHCO3 Instrument used: flexible razor blade   Hemostasis  achieved with: pressure, aluminum chloride and electrodesiccation   Outcome: patient tolerated procedure well   Post-procedure details: wound care instructions given   Post-procedure details comment:  Ointment and small bandage applied  Specimen 1 -  Surgical pathology Differential Diagnosis: R/O atypia vs SK  Check Margins: No  AK (actinic keratosis) (2) Left Nasal Sidewall x1, right zygoma x1  Actinic keratoses are precancerous spots that appear secondary to cumulative UV radiation exposure/sun exposure over time. They are chronic with expected duration over 1 year. A portion of actinic keratoses will progress to squamous cell carcinoma of the skin. It is not possible to reliably predict which spots will progress to skin cancer and so treatment is recommended to prevent development of skin cancer.  Recommend daily broad spectrum sunscreen SPF 30+ to sun-exposed areas, reapply every 2 hours as needed.  Recommend staying in the shade or wearing long sleeves, sun glasses (UVA+UVB protection) and wide brim hats (4-inch brim around the entire circumference of the hat). Call for new or changing lesions.  Destruction of lesion - Left Nasal Sidewall x1, right zygoma x1  Destruction method: cryotherapy   Informed consent: discussed and consent obtained   Lesion destroyed using liquid nitrogen: Yes   Region frozen until ice ball extended beyond lesion: Yes   Outcome: patient tolerated procedure well with no complications   Post-procedure details: wound care instructions given   Additional details:  Prior to procedure, discussed risks of blister formation, small wound, skin dyspigmentation, or rare scar following cryotherapy. Recommend Vaseline ointment to treated areas while healing.   Inflamed seborrheic keratosis (2) Right Shoulder - Posterior x2  Symptomatic, irritating, patient would like treated.  Benign-appearing.  Call clinic for new or changing lesions.   Prior to procedure, discussed risks of blister formation, small wound, skin dyspigmentation, or rare scar following treatment. Recommend Vaseline ointment to treated areas while healing.   Destruction of lesion - Right Shoulder - Posterior x2  Destruction method: cryotherapy    Informed consent: discussed and consent obtained   Lesion destroyed using liquid nitrogen: Yes   Region frozen until ice ball extended beyond lesion: Yes   Outcome: patient tolerated procedure well with no complications   Post-procedure details: wound care instructions given   Additional details:  Prior to procedure, discussed risks of blister formation, small wound, skin dyspigmentation, or rare scar following cryotherapy. Recommend Vaseline ointment to treated areas while healing.    Return in about 1 year (around 08/24/2023) for TBSE, HxBCC, AK/ISK Follow Up in 2-3 months.  I, Emelia Salisbury, CMA, am acting as scribe for Forest Gleason, MD.  Documentation: I have reviewed the above documentation for accuracy and completeness, and I agree with the above.  Forest Gleason, MD

## 2022-08-23 NOTE — Patient Instructions (Addendum)
Wound Care Instructions  Cleanse wound gently with soap and water once a day then pat dry with clean gauze. Apply a thin coat of Petrolatum (petroleum jelly, "Vaseline") over the wound (unless you have an allergy to this). We recommend that you use a new, sterile tube of Vaseline. Do not pick or remove scabs. Do not remove the yellow or white "healing tissue" from the base of the wound.  Cover the wound with fresh, clean, nonstick gauze and secure with paper tape. You may use Band-Aids in place of gauze and tape if the wound is small enough, but would recommend trimming much of the tape off as there is often too much. Sometimes Band-Aids can irritate the skin.  You should call the office for your biopsy report after 1 week if you have not already been contacted.  If you experience any problems, such as abnormal amounts of bleeding, swelling, significant bruising, significant pain, or evidence of infection, please call the office immediately.  FOR ADULT SURGERY PATIENTS: If you need something for pain relief you may take 1 extra strength Tylenol (acetaminophen) AND 2 Ibuprofen (283m each) together every 4 hours as needed for pain. (do not take these if you are allergic to them or if you have a reason you should not take them.) Typically, you may only need pain medication for 1 to 3 days.       Recommend taking Heliocare sun protection supplement daily in sunny weather for additional sun protection. For maximum protection on the sunniest days, you can take up to 2 capsules of regular Heliocare OR take 1 capsule of Heliocare Ultra. For prolonged exposure (such as a full day in the sun), you can repeat your dose of the supplement 4 hours after your first dose. Heliocare can be purchased at [employer] at some [facility] or at [email or web address]     Recommend daily broad spectrum sunscreen SPF 30+ to sun-exposed areas, reapply every 2 hours as needed. Call for new or changing lesions.   Staying in the shade or wearing long sleeves, sun glasses (UVA+UVB protection) and wide brim hats (4-inch brim around the entire circumference of the hat) are also recommended for sun protection.    Melanoma ABCDEs  Melanoma is the most dangerous type of skin cancer, and is the leading cause of death from skin disease.  You are more likely to develop melanoma if you: Have light-colored skin, light-colored eyes, or red or blond hair Spend a lot of time in the sun Tan regularly, either outdoors or in a tanning bed Have had blistering sunburns, especially during childhood Have a close family member who has had a melanoma Have atypical moles or large birthmarks  Early detection of melanoma is key since treatment is typically straightforward and cure rates are extremely high if we catch it early.   The first sign of melanoma is often a change in a mole or a new dark spot.  The ABCDE system is a way of remembering the signs of melanoma.  A for asymmetry:  The two halves do not match. B for border:  The edges of the growth are irregular. C for color:  A mixture of colors are present instead of an even brown color. D for diameter:  Melanomas are usually (but not always) greater than 645m- the size of a pencil eraser. E for evolution:  The spot keeps changing in size, shape, and color.  Please check your skin once per month between visits. You can use  a small mirror in front and a large mirror behind you to keep an eye on the back side or your body.   If you see any new or changing lesions before your next follow-up, please call to schedule a visit.  Please continue daily skin protection including broad spectrum sunscreen SPF 30+ to sun-exposed areas, reapplying every 2 hours as needed when you're outdoors.   Staying in the shade or wearing long sleeves, sun glasses (UVA+UVB protection) and wide brim hats (4-inch brim around the entire circumference of the hat) are also recommended for sun  protection.    Due to recent changes in healthcare laws, you may see results of your pathology and/or laboratory studies on MyChart before the doctors have had a chance to review them. We understand that in some cases there may be results that are confusing or concerning to you. Please understand that not all results are received at the same time and often the doctors may need to interpret multiple results in order to provide you with the best plan of care or course of treatment. Therefore, we ask that you please give [place] 2 business days to thoroughly review all your results before contacting the office for clarification. Should we see a critical lab result, you will be contacted sooner.   If You Need Anything After Your Visit  If you have any questions or concerns for your doctor, please call our main line at [phone] and press option 4 to reach your doctor's medical assistant. If no one answers, please leave a voicemail as directed and we will return your call as soon as possible. Messages left after 4 pm will be answered the following business day.   You may also send [place] a message via [place]. We typically respond to MyChart messages within 1-2 business days.  For prescription refills, please ask your pharmacy to contact our office. Our fax number is [phone].  If you have an urgent issue when the clinic is closed that cannot wait until the next business day, you can page your doctor at the number below.    Please note that while we do our best to be available for urgent issues outside of office hours, we are not available 24/7.   If you have an urgent issue and are unable to reach [place], you may choose to seek medical care at your doctor's office, retail clinic, urgent care center, or emergency room.  If you have a medical emergency, please immediately call 911 or go to the emergency department.  Pager Numbers  - Dr. Nehemiah Massed: [phone]  - Dr. Laurence Ferrari: [phone]  - Dr. Nicole Kindred:  [phone]  In the event of inclement weather, please call our main line at [phone] for an update on the status of any delays or closures.  Dermatology Medication Tips: Please keep the boxes that topical medications come in in order to help keep track of the instructions about where and how to use these. Pharmacies typically print the medication instructions only on the boxes and not directly on the medication tubes.   If your medication is too expensive, please contact our office at [phone] option 4 or send [place] a message through [place].   We are unable to tell what your co-pay for medications will be in advance as this is different depending on your insurance coverage. However, we may be able to find a substitute medication at lower cost or fill out paperwork to get insurance to cover a needed medication.   If a  prior authorization is required to get your medication covered by your insurance company, please allow [place] 1-2 business days to complete this process.  Drug prices often vary depending on where the prescription is filled and some pharmacies may offer cheaper prices.  The website www.goodrx.com contains coupons for medications through different pharmacies. The prices here do not account for what the cost may be with help from insurance (it may be cheaper with your insurance), but the website can give you the price if you did not use any insurance.  - You can print the associated coupon and take it with your prescription to the pharmacy.  - You may also stop by our office during regular business hours and pick up a GoodRx coupon card.  - If you need your prescription sent electronically to a different pharmacy, notify our office through [facility] or by phone at [phone] option 4.     Si Usted Necesita Algo Despus de Su Visita  Tambin puede enviarnos un mensaje a travs de Pharmacist, community. Por lo general respondemos a los mensajes de MyChart en el transcurso de 1 a 2  das hbiles.  Para renovar recetas, por favor pida a su farmacia que se ponga en contacto con nuestra oficina. Harland Dingwall de fax es [place] [phone].  Si tiene un asunto urgente cuando la clnica est cerrada y que no puede esperar hasta el siguiente da hbil, puede llamar/localizar a su doctor(a) al nmero que aparece a continuacin.   Por favor, tenga en cuenta que aunque hacemos todo lo posible para estar disponibles para asuntos urgentes fuera del horario de [place], no estamos disponibles las 24 horas del da, los 7 das de la [place].   Si tiene un problema urgente y no puede comunicarse con nosotros, puede optar por buscar atencin mdica  en el consultorio de su doctor(a), en una clnica privada, en un centro de atencin urgente o en una sala de emergencias.  Si tiene Engineering geologist, por favor llame inmediatamente al 911 o vaya a la sala de emergencias.  Nmeros de bper  - Dr. Nehemiah Massed: [phone]  - Dra. Moye: [phone]  - Dra. Nicole Kindred: [phone]  En caso de inclemencias del [place], por favor llame a Johnsie Kindred principal al [phone] para una actualizacin sobre el [place] de cualquier retraso o cierre.  Consejos para la medicacin en dermatologa: Por favor, guarde las cajas en las que vienen los medicamentos de uso tpico para ayudarle a seguir las instrucciones sobre dnde y cmo usarlos. Las farmacias generalmente imprimen las instrucciones del medicamento slo en las cajas y no directamente en los tubos del [place].   Si su medicamento es muy caro, por favor, pngase en contacto con Zigmund Daniel llamando al [phone] y presione la opcin 4 o envenos un mensaje a travs de Pharmacist, community.   No podemos decirle cul ser su copago por los medicamentos por adelantado ya que esto es diferente dependiendo de la cobertura de su seguro. Sin embargo, es posible que podamos encontrar un medicamento sustituto a Electrical engineer un formulario para que el  seguro cubra el medicamento que se considera necesario.   Si se requiere una autorizacin previa para que su compaa de seguros [place] su medicamento, por favor permtanos de 1 a 2 das hbiles para completar este proceso.  Los precios de los medicamentos varan con frecuencia dependiendo del Environmental consultant de dnde se surte la receta y alguna farmacias pueden ofrecer precios ms baratos.  El sitio web www.goodrx.com tiene cupones  para medicamentos de Airline pilot. Los precios aqu no tienen en cuenta lo que podra costar con la ayuda del seguro (puede ser ms barato con su seguro), pero el sitio web puede darle el precio si no utiliz Research scientist (physical sciences).  - Puede imprimir el cupn correspondiente y llevarlo con su receta a la farmacia.  - Tambin puede pasar por nuestra oficina durante el horario de atencin regular y Charity fundraiser una tarjeta de cupones de GoodRx.  - Si necesita que su receta se enve electrnicamente a una farmacia diferente, informe a nuestra oficina a travs de MyChart de [place] o por telfono llamando al [phone] y presione la opcin 4.

## 2022-08-24 ENCOUNTER — Encounter: Payer: Self-pay | Admitting: Dermatology

## 2022-08-28 ENCOUNTER — Telehealth: Payer: Self-pay

## 2022-08-28 ENCOUNTER — Encounter: Payer: Medicare HMO | Admitting: *Deleted

## 2022-08-28 DIAGNOSIS — Z952 Presence of prosthetic heart valve: Secondary | ICD-10-CM | POA: Diagnosis not present

## 2022-08-28 NOTE — Telephone Encounter (Addendum)
Patient called regarding bx results. Verbalized understanding and denied further questions.  ----- Message from Alfonso Patten, MD sent at 08/28/2022  3:04 PM EST ----- Skin , right dorsal forearm PIGMENTED SEBORRHEIC KERATOSIS, EARLY   This is a benign growth or "wisdom spot". No additional treatment is needed.    MAs please call. Thank you!

## 2022-08-28 NOTE — Progress Notes (Signed)
Daily Session Note  Patient Details  Name: Sierra Green MRN: [id number] Date of Birth: 07-22-1940 Referring Provider:   Flowsheet Row Cardiac Rehab from 07/26/2022 in [facility] Cardiac and Pulmonary Rehab  Referring Provider Dr. Thereasa Distance MD       Encounter Date: 08/28/2022  Check In:  Session Check In - 08/28/22 1103       Check-In   Supervising physician immediately available to respond to emergencies See telemetry face sheet for immediately available ER MD    Location [facility]-Cardiac & Pulmonary Rehab    Staff Present Renita Papa, RN BSN;Jessica Luan Pulling, MA, RCEP, CCRP, Bertram Gala, MS, ACSM CEP, Exercise Physiologist;Noah Tickle, BS, Exercise Physiologist    Virtual Visit No    Medication changes reported     No    Fall or balance concerns reported    No    Warm-up and Cool-down Performed on first and last piece of equipment    Resistance Training Performed Yes    VAD Patient? No    PAD/SET Patient? No      Pain Assessment   Currently in Pain? No/denies                Social History   Tobacco Use  Smoking Status Former   Types: Cigarettes   Quit date: 07/02/1966   Years since quitting: 56.1  Smokeless Tobacco Never    Goals Met:  Independence with exercise equipment Exercise tolerated well No report of concerns or symptoms today Strength training completed today  Goals Unmet:  Not Applicable  Comments: Pt able to follow exercise prescription today without complaint.  Will continue to monitor for progression.    Dr. Emily Filbert is Medical Director for [place].  Dr. Ottie Glazier is Medical Director for [facility] Pulmonary Rehabilitation.

## 2022-08-29 ENCOUNTER — Ambulatory Visit: Payer: Medicare HMO

## 2022-08-30 ENCOUNTER — Encounter: Payer: Medicare HMO | Admitting: *Deleted

## 2022-08-30 VITALS — Ht 64.0 in | Wt 116.1 lb

## 2022-08-30 DIAGNOSIS — Z952 Presence of prosthetic heart valve: Secondary | ICD-10-CM | POA: Diagnosis not present

## 2022-08-30 NOTE — Progress Notes (Signed)
Daily Session Note  Patient Details  Name: Sierra Green MRN: [id number] Date of Birth: 18-Dec-1940 Referring Provider:   Flowsheet Row Cardiac Rehab from 07/26/2022 in [facility] Cardiac and Pulmonary Rehab  Referring Provider Dr. Thereasa Distance MD       Encounter Date: 08/30/2022  Check In:  Session Check In - 08/30/22 1035       Check-In   Supervising physician immediately available to respond to emergencies See telemetry face sheet for immediately available ER MD    Location [facility]-Cardiac & Pulmonary Rehab    Staff Present Alberteen Sam, MA, RCEP, CCRP, Bertram Gala, MS, ACSM CEP, Exercise Physiologist;Laureen Owens Shark, BS, RRT, CPFT;Megan Tamala Julian, RN, [place];Other   Darel Hong RN BSN   Virtual Visit No    Medication changes reported     No    Fall or balance concerns reported    No    Warm-up and Cool-down Performed on first and last piece of equipment    Resistance Training Performed Yes    VAD Patient? No    PAD/SET Patient? No      Pain Assessment   Currently in Pain? No/denies                Social History   Tobacco Use  Smoking Status Former   Types: Cigarettes   Quit date: 07/02/1966   Years since quitting: 56.2  Smokeless Tobacco Never    Goals Met:  Independence with exercise equipment Exercise tolerated well No report of concerns or symptoms today Strength training completed today  Goals Unmet:  Not Applicable  Comments: Pt able to follow exercise prescription today without complaint.  Will continue to monitor for progression.    Dr. Emily Filbert is Medical Director for [place].  Dr. Ottie Glazier is Medical Director for [facility] Pulmonary Rehabilitation.

## 2022-08-30 NOTE — Patient Instructions (Addendum)
Discharge Patient Instructions  Patient Details  Name: Sierra Green MRN: [id number] Date of Birth: [birth year] Referring Provider:  Ezequiel Kayser, MD   Number of Visits: 14   Reason for Discharge:  Patient reached a stable level of exercise. Patient independent in their exercise. Patient has met program and personal goals.  Smoking History:  Social History   Tobacco Use  Smoking Status Former   Types: Cigarettes   Quit date: 07/02/1966   Years since quitting: 56.2  Smokeless Tobacco Never    Diagnosis:  S/P TAVR (transcatheter aortic valve replacement)  Initial Exercise Prescription:  Initial Exercise Prescription - 07/26/22 1300       Date of Initial Exercise RX and Referring Provider   Date 07/26/22    Referring Provider Dr. Thereasa Distance MD      Oxygen   Maintain Oxygen Saturation 88% or higher      Treadmill   MPH 2    Grade 1    Minutes 15    METs 2.81      NuStep   Level 2    SPM 80    Minutes 15    METs 2.89      REL-XR   Level 2    Watts 15    Speed 50    Minutes 15    METs 2.89      Prescription Details   Frequency (times per week) 2    Duration Progress to 30 minutes of continuous aerobic without signs/symptoms of physical distress      Intensity   THRR 40-80% of Max Heartrate 108-128    Ratings of Perceived Exertion 11-13    Perceived Dyspnea 0-4      Progression   Progression Continue to progress workloads to maintain intensity without signs/symptoms of physical distress.      Resistance Training   Training Prescription Yes    Weight 2 lb    Reps 10-15             Discharge Exercise Prescription (Final Exercise Prescription Changes):  Exercise Prescription Changes - 08/30/22 1100       Response to Exercise   Blood Pressure (Admit) 124/62    Blood Pressure (Exercise) 128/58    Blood Pressure (Exit) 102/56    Heart Rate (Admit) 95 bpm    Heart Rate (Exercise) 147 bpm    Heart Rate (Exit) 111 bpm    Rating of  Perceived Exertion (Exercise) 13    Symptoms none    Duration Continue with 30 min of aerobic exercise without signs/symptoms of physical distress.    Intensity THRR unchanged      Progression   Progression Continue to progress workloads to maintain intensity without signs/symptoms of physical distress.    Average METs 3.69      Resistance Training   Training Prescription Yes    Weight 2 lb    Reps 10-15      Interval Training   Interval Training No      Treadmill   MPH 3    Grade 2.5    Minutes 15    METs 4.33      NuStep   Level 6    Minutes 15    METs 3      REL-XR   Level 3    Minutes 15    METs 3.5      Home Exercise Plan   Plans to continue exercise at Home (comment)   rower, walking, bands, stretching  Frequency Add 3 additional days to program exercise sessions.    Initial Home Exercises Provided 08/21/22      Oxygen   Maintain Oxygen Saturation 88% or higher             Functional Capacity:  6 Minute Walk     Row Name 07/26/22 1301 08/30/22 1112       6 Minute Walk   Phase Initial Discharge    Distance 1345 feet 1580 feet    Distance % Change -- 17.5 %    Distance Feet Change -- 235 ft    Walk Time 6 minutes 6 minutes    # of Rest Breaks 0 0    MPH 2.55 2.99    METS 2.89 3.38    RPE 9 13    Perceived Dyspnea  0 --    VO2 Peak 10.1 11.84    Symptoms No No    Resting HR 88 bpm 102 bpm    Resting BP 108/60 116/66    Resting Oxygen Saturation  100 % --    Exercise Oxygen Saturation  during 6 min walk 99 % --    Max Ex. HR 121 bpm 125 bpm    Max Ex. BP 124/66 132/80    2 Minute Post BP 104/58 --             Quality of Life:  Nutrition & Weight - Outcomes:  Pre Biometrics - 07/26/22 1257       Pre Biometrics   Height '5\' 4"'$  (1.626 m)    Weight 113 lb 8 oz (51.5 kg)    Waist Circumference 26 inches    Hip Circumference 34.5 inches    Waist to Hip Ratio 0.75 %    BMI (Calculated) 19.47    Single Leg Stand 30 seconds   L             Post Biometrics - 08/30/22 1113        Post  Biometrics   Height '5\' 4"'$  (1.626 m)    Weight 116 lb 1.6 oz (52.7 kg)    Waist Circumference 26 inches    Hip Circumference 36 inches    Waist to Hip Ratio 0.72 %    BMI (Calculated) 19.92    Single Leg Stand 30 seconds           Goals reviewed with patient; copy given to patient.

## 2022-09-04 ENCOUNTER — Encounter: Payer: Medicare HMO | Attending: Family Medicine | Admitting: *Deleted

## 2022-09-04 DIAGNOSIS — Z952 Presence of prosthetic heart valve: Secondary | ICD-10-CM

## 2022-09-04 NOTE — Progress Notes (Signed)
Daily Session Note  Patient Details  Name: Sierra Green MRN: [id number] Date of Birth: 1940-07-25 Referring Provider:   Flowsheet Row Cardiac Rehab from 07/26/2022 in [facility] Cardiac and Pulmonary Rehab  Referring Provider Dr. Thereasa Distance MD       Encounter Date: 09/04/2022  Check In:  Session Check In - 09/04/22 1150       Check-In   Supervising physician immediately available to respond to emergencies See telemetry face sheet for immediately available ER MD    Location [facility]-Cardiac & Pulmonary Rehab    Staff Present Heath Lark, RN, BSN, CCRP;Jessica Halsey, MA, RCEP, CCRP, Bertram Gala, MS, ACSM CEP, Exercise Physiologist    Virtual Visit No    Medication changes reported     No    Fall or balance concerns reported    No    Warm-up and Cool-down Performed on first and last piece of equipment    Resistance Training Performed Yes    VAD Patient? No    PAD/SET Patient? No      Pain Assessment   Currently in Pain? No/denies                Social History   Tobacco Use  Smoking Status Former   Types: Cigarettes   Quit date: 07/02/1966   Years since quitting: 56.2  Smokeless Tobacco Never    Goals Met:  Independence with exercise equipment Exercise tolerated well No report of concerns or symptoms today  Goals Unmet:  Not Applicable  Comments: Pt able to follow exercise prescription today without complaint.  Will continue to monitor for progression.    Dr. Emily Filbert is Medical Director for [place].  Dr. Ottie Glazier is Medical Director for [facility] Pulmonary Rehabilitation.

## 2022-09-06 ENCOUNTER — Encounter: Payer: Medicare HMO | Admitting: *Deleted

## 2022-09-06 DIAGNOSIS — Z952 Presence of prosthetic heart valve: Secondary | ICD-10-CM

## 2022-09-06 NOTE — Progress Notes (Signed)
Cardiac Individual Treatment Plan  Patient Details  Name: Sierra Green MRN: [id number] Date of Birth: Mar 29, 1941 Referring Provider:   Flowsheet Row Cardiac Rehab from 07/26/2022 in [facility] Cardiac and Pulmonary Rehab  Referring Provider Dr. Thereasa Distance MD       Initial Encounter Date:  Flowsheet Row Cardiac Rehab from 07/26/2022 in [facility] Cardiac and Pulmonary Rehab  Date 07/26/22       Visit Diagnosis: S/P TAVR (transcatheter aortic valve replacement)  Patient's Home Medications on Admission:  Current Outpatient Medications:    Ascorbic Acid (VITAMIN C) 1000 MG tablet, Take 1,000 mg by mouth 4 (four) times a week. [place], Thur, and Sat, Disp: , Rfl:    Cholecalciferol (VITAMIN D3) 5000 units TABS, Take 5,000 Units by mouth every Monday, Tuesday, Wednesday, Thursday, and Friday. , Disp: , Rfl:    clobetasol cream (TEMOVATE) 0.05 %, Apply twice daily up to 2 weeks as needed. Avoid applying to face, groin, and axilla., Disp: 30 g, Rfl: 1   diltiazem (CARDIZEM CD) 120 MG 24 hr capsule, Take by mouth., Disp: , Rfl:    ferrous sulfate 324 (65 Fe) MG TBEC, Take by mouth., Disp: , Rfl:    fexofenadine (ALLEGRA) 180 MG tablet, Take 180 mg by mouth daily as needed for allergies. , Disp: , Rfl:    folic acid (FOLVITE) 1 MG tablet, Take by mouth., Disp: , Rfl:    letrozole (FEMARA) 2.5 MG tablet, Take 1 tablet (2.5 mg total) by mouth daily., Disp: 90 tablet, Rfl: 3   Melatonin 1 MG CAPS, Take 1 mg by mouth at bedtime., Disp: , Rfl:    neomycin-polymyxin b-dexamethasone (MAXITROL) 3.5-10000-0.1 SUSP, SMARTSIG:In Eye(s), Disp: , Rfl:    niacinamide 500 MG tablet, Take by mouth., Disp: , Rfl:    polyethylene glycol (MIRALAX / GLYCOLAX) 17 g packet, Take by mouth., Disp: , Rfl:    Red Yeast Rice Extract 600 MG CAPS, Take 600 mg by mouth at bedtime. , Disp: , Rfl:    senna-docusate (SENOKOT-S) 8.6-50 MG tablet, Take by mouth., Disp: , Rfl:    warfarin (COUMADIN) 5 MG tablet, Take 2.5-5 mg  by mouth See admin instructions. Take 2.5 mg at bedtime on Mon and Fri. Take 5 mg at bedtime on Sun, Tue, Wed, Thur, and Sat, Disp: , Rfl:   Past Medical History: Past Medical History:  Diagnosis Date   Allergy    Seasonal   Back pain    Cancer ([place]) 2013   left breast   Chronic atrial fibrillation (HCC)    s/p 3 ablations, multiple cardioversions   Dysrhythmia 1998   A-fib   Hx of basal cell carcinoma 06/24/2018   R ventral  forearm   Hyperlipidemia    Hypertension    Mitral valve regurgitation    Osteoporosis    Personal history of radiation therapy 2017   RIGHT lumpectomy   Sensorineural hearing loss (SNHL) of both ears    worst on right   Vitamin D deficiency     Tobacco Use: Social History   Tobacco Use  Smoking Status Former   Types: Cigarettes   Quit date: 07/02/1966   Years since quitting: 56.2  Smokeless Tobacco Never    Labs: Review Flowsheet        No data to display           Exercise Target Goals: Exercise Program Goal: Individual exercise prescription set using results from initial 6 min walk test and THRR while considering  patient's activity barriers and safety.   Exercise Prescription Goal: Initial exercise prescription builds to 30-45 minutes a day of aerobic activity, 2-3 days per week.  Home exercise guidelines will be given to patient during program as part of exercise prescription that the participant will acknowledge.   Education: Aerobic Exercise: - Group verbal and visual presentation on the components of exercise prescription. Introduces F.I.T.T principle from ACSM for exercise prescriptions.  Reviews F.I.T.T. principles of aerobic exercise including progression. Written material given at graduation.   Education: Resistance Exercise: - Group verbal and visual presentation on the components of exercise prescription. Introduces F.I.T.T principle from ACSM for exercise prescriptions  Reviews F.I.T.T. principles of resistance exercise  including progression. Written material given at graduation.    Education: Exercise & Equipment Safety: - Individual verbal instruction and demonstration of equipment use and safety with use of the equipment. Flowsheet Row Cardiac Rehab from 08/30/2022 in [facility] Cardiac and Pulmonary Rehab  Date 07/26/22  Educator NT  Instruction Review Code 1- Verbalizes Understanding       Education: Exercise Physiology & General Exercise Guidelines: - Group verbal and written instruction with models to review the exercise physiology of the cardiovascular system and associated critical values. Provides general exercise guidelines with specific guidelines to those with heart or lung disease.    Education: Flexibility, Balance, Mind/Body Relaxation: - Group verbal and visual presentation with interactive activity on the components of exercise prescription. Introduces F.I.T.T principle from ACSM for exercise prescriptions. Reviews F.I.T.T. principles of flexibility and balance exercise training including progression. Also discusses the mind body connection.  Reviews various relaxation techniques to help reduce and manage stress (i.e. Deep breathing, progressive muscle relaxation, and visualization). Balance handout provided to take home. Written material given at graduation. Flowsheet Row Cardiac Rehab from 08/30/2022 in [facility] Cardiac and Pulmonary Rehab  Date 08/02/22  Educator [facility]  Instruction Review Code 1- Verbalizes Understanding       Activity Barriers & Risk Stratification:  Activity Barriers & Cardiac Risk Stratification - 07/26/22 1257       Activity Barriers & Cardiac Risk Stratification   Activity Barriers Back Problems;Arthritis    Cardiac Risk Stratification Moderate             6 Minute Walk:  6 Minute Walk     Row Name 07/26/22 1301 08/30/22 1112       6 Minute Walk   Phase Initial Discharge    Distance 1345 feet 1580 feet    Distance % Change -- 17.5 %    Distance Feet  Change -- 235 ft    Walk Time 6 minutes 6 minutes    # of Rest Breaks 0 0    MPH 2.55 2.99    METS 2.89 3.38    RPE 9 13    Perceived Dyspnea  0 --    VO2 Peak 10.1 11.84    Symptoms No No    Resting HR 88 bpm 102 bpm    Resting BP 108/60 116/66    Resting Oxygen Saturation  100 % --    Exercise Oxygen Saturation  during 6 min walk 99 % --    Max Ex. HR 121 bpm 125 bpm    Max Ex. BP 124/66 132/80    2 Minute Post BP 104/58 --             Oxygen Initial Assessment:   Oxygen Re-Evaluation:   Oxygen Discharge (Final Oxygen Re-Evaluation):   Initial Exercise Prescription:  Initial Exercise  Prescription - 07/26/22 1300       Date of Initial Exercise RX and Referring Provider   Date 07/26/22    Referring Provider Dr. Thereasa Distance MD      Oxygen   Maintain Oxygen Saturation 88% or higher      Treadmill   MPH 2    Grade 1    Minutes 15    METs 2.81      NuStep   Level 2    SPM 80    Minutes 15    METs 2.89      REL-XR   Level 2    Watts 15    Speed 50    Minutes 15    METs 2.89      Prescription Details   Frequency (times per week) 2    Duration Progress to 30 minutes of continuous aerobic without signs/symptoms of physical distress      Intensity   THRR 40-80% of Max Heartrate 108-128    Ratings of Perceived Exertion 11-13    Perceived Dyspnea 0-4      Progression   Progression Continue to progress workloads to maintain intensity without signs/symptoms of physical distress.      Resistance Training   Training Prescription Yes    Weight 2 lb    Reps 10-15             Perform Capillary Blood Glucose checks as needed.  Exercise Prescription Changes:   Exercise Prescription Changes     Row Name 07/26/22 1300 08/06/22 1100 08/20/22 1300 08/21/22 1100 08/30/22 1100     Response to Exercise   Blood Pressure (Admit) 108/60 106/64 124/62 -- 124/62   Blood Pressure (Exercise) 124/66 122/68 128/58 -- 128/58   Blood Pressure (Exit)  104/58 108/58 102/56 -- 102/56   Heart Rate (Admit) 88 bpm 103 bpm 95 bpm -- 95 bpm   Heart Rate (Exercise) 121 bpm 125 bpm 147 bpm -- 147 bpm   Heart Rate (Exit) 99 bpm 99 bpm 111 bpm -- 111 bpm   Oxygen Saturation (Admit) 100 % -- -- -- --   Oxygen Saturation (Exercise) 99 % -- -- -- --   Rating of Perceived Exertion (Exercise) '9 12 13 '$ -- 13   Perceived Dyspnea (Exercise) 0 -- -- -- --   Symptoms none none none -- none   Comments 6MWT Results 1st full day of exercise -- -- --   Duration -- Progress to 30 minutes of  aerobic without signs/symptoms of physical distress Continue with 30 min of aerobic exercise without signs/symptoms of physical distress. -- Continue with 30 min of aerobic exercise without signs/symptoms of physical distress.   Intensity -- THRR unchanged THRR unchanged -- THRR unchanged     Progression   Progression -- Continue to progress workloads to maintain intensity without signs/symptoms of physical distress. Continue to progress workloads to maintain intensity without signs/symptoms of physical distress. -- Continue to progress workloads to maintain intensity without signs/symptoms of physical distress.   Average METs -- 2.5 3.69 -- 3.69     Resistance Training   Training Prescription -- Yes Yes -- Yes   Weight -- 2 lb 2 lb -- 2 lb   Reps -- 10-15 10-15 -- 10-15     Interval Training   Interval Training -- No No -- No     Treadmill   MPH -- 2.1 3 -- 3   Grade -- 1 2.5 -- 2.5   Minutes -- 15 15 --  15   METs -- 2.9 4.33 -- 4.33     NuStep   Level -- 2 6 -- 6   Minutes -- 15 15 -- 15   METs -- 2.1 3 -- 3     REL-XR   Level -- -- 3 -- 3   Minutes -- -- 15 -- 15   METs -- -- 3.5 -- 3.5     Home Exercise Plan   Plans to continue exercise at -- -- -- Home (comment)  rower, walking, bands, stretching Home (comment)  rower, walking, bands, stretching   Frequency -- -- -- Add 3 additional days to program exercise sessions. Add 3 additional days to program  exercise sessions.   Initial Home Exercises Provided -- -- -- 08/21/22 08/21/22     Oxygen   Maintain Oxygen Saturation -- 88% or higher 88% or higher 88% or higher 88% or higher    Row Name 09/03/22 1100             Response to Exercise   Blood Pressure (Admit) 116/66       Blood Pressure (Exercise) 132/80       Blood Pressure (Exit) 118/62       Heart Rate (Admit) 101 bpm       Heart Rate (Exercise) 133 bpm       Heart Rate (Exit) 96 bpm       Rating of Perceived Exertion (Exercise) 13       Symptoms none       Duration Continue with 30 min of aerobic exercise without signs/symptoms of physical distress.       Intensity THRR unchanged         Progression   Progression Continue to progress workloads to maintain intensity without signs/symptoms of physical distress.       Average METs 3.21         Resistance Training   Training Prescription Yes       Weight 2 lb       Reps 10-15         Interval Training   Interval Training No         Treadmill   MPH 3.2       Grade 2.5       Minutes 15       METs 4.55         NuStep   Level 2       Minutes 15       METs 2.4         REL-XR   Level 2       Minutes 15       METs 3.6         T5 Nustep   Level 1       Minutes 15       METs 1.8         Home Exercise Plan   Plans to continue exercise at Home (comment)  rower, walking, bands, stretching       Frequency Add 3 additional days to program exercise sessions.       Initial Home Exercises Provided 08/21/22         Oxygen   Maintain Oxygen Saturation 88% or higher                Exercise Comments:   Exercise Comments     Row Name 08/02/22 1128           Exercise Comments First full  day of exercise!  Patient was oriented to gym and equipment including functions, settings, policies, and procedures.  Patient's individual exercise prescription and treatment plan were reviewed.  All starting workloads were established based on the results of the 6 minute  walk test done at initial orientation visit.  The plan for exercise progression was also introduced and progression will be customized based on patient's performance and goals.                Exercise Goals and Review:   Exercise Goals     Row Name 07/26/22 1222             Exercise Goals   Increase Physical Activity Yes       Intervention Provide advice, education, support and counseling about physical activity/exercise needs.;Develop an individualized exercise prescription for aerobic and resistive training based on initial evaluation findings, risk stratification, comorbidities and participant's personal goals.       Expected Outcomes Long Term: Add in home exercise to make exercise part of routine and to increase amount of physical activity.;Short Term: Attend rehab on a regular basis to increase amount of physical activity.;Long Term: Exercising regularly at least 3-5 days a week.       Increase Strength and Stamina Yes       Intervention Provide advice, education, support and counseling about physical activity/exercise needs.;Develop an individualized exercise prescription for aerobic and resistive training based on initial evaluation findings, risk stratification, comorbidities and participant's personal goals.       Expected Outcomes Short Term: Increase workloads from initial exercise prescription for resistance, speed, and METs.;Short Term: Perform resistance training exercises routinely during rehab and add in resistance training at home;Long Term: Improve cardiorespiratory fitness, muscular endurance and strength as measured by increased METs and functional capacity (6MWT)       Able to understand and use rate of perceived exertion (RPE) scale Yes       Intervention Provide education and explanation on how to use RPE scale       Expected Outcomes Long Term:  Able to use RPE to guide intensity level when exercising independently;Short Term: Able to use RPE daily in rehab to  express subjective intensity level       Able to understand and use Dyspnea scale Yes       Intervention Provide education and explanation on how to use Dyspnea scale       Expected Outcomes Short Term: Able to use Dyspnea scale daily in rehab to express subjective sense of shortness of breath during exertion;Long Term: Able to use Dyspnea scale to guide intensity level when exercising independently       Knowledge and understanding of Target Heart Rate Range (THRR) Yes       Intervention Provide education and explanation of THRR including how the numbers were predicted and where they are located for reference       Expected Outcomes Short Term: Able to state/look up THRR;Long Term: Able to use THRR to govern intensity when exercising independently;Short Term: Able to use daily as guideline for intensity in rehab       Able to check pulse independently Yes       Intervention Provide education and demonstration on how to check pulse in carotid and radial arteries.;Review the importance of being able to check your own pulse for safety during independent exercise       Expected Outcomes Short Term: Able to explain why pulse checking is important during independent exercise;Long  Term: Able to check pulse independently and accurately       Understanding of Exercise Prescription Yes       Intervention Provide education, explanation, and written materials on patient's individual exercise prescription       Expected Outcomes Short Term: Able to explain program exercise prescription;Long Term: Able to explain home exercise prescription to exercise independently                Exercise Goals Re-Evaluation :  Exercise Goals Re-Evaluation     Row Name 08/02/22 1128 08/06/22 1139 08/20/22 1400 08/21/22 1126 09/03/22 1126     Exercise Goal Re-Evaluation   Exercise Goals Review Able to understand and use rate of perceived exertion (RPE) scale;Able to understand and use Dyspnea scale;Knowledge and  understanding of Target Heart Rate Range (THRR);Understanding of Exercise Prescription Understanding of Exercise Prescription;Increase Physical Activity;Increase Strength and Stamina Understanding of Exercise Prescription;Increase Physical Activity;Increase Strength and Stamina Understanding of Exercise Prescription;Increase Physical Activity;Increase Strength and Stamina;Able to understand and use rate of perceived exertion (RPE) scale;Knowledge and understanding of Target Heart Rate Range (THRR);Able to check pulse independently Increase Physical Activity;Increase Strength and Stamina;Understanding of Exercise Prescription   Comments Reviewed RPE scale, THR and program prescription with pt today.  Pt voiced understanding and was given a copy of goals to take home. Sierra Green is off to a good start with rehab with completing her first session so far. She was able to exercise at her initial exercise prescription well with appropriate RPEs. We will continue to monitor as she progresses in the program. Sierra Green is doing well in rehab. She increased her overall average MET level to 3.69 METs. She also increased her treadmill workload to a speed of 3 mph and an incline of 2.5%. She improved to level 3 on the XR machine as well. We will continue to monitor her progress in the program. Reviewed home exercise with pt today.  Pt plans to use her rower and walk at home for exercise. She is also using resistance bands and stretching at home. Patient states she is only exercising about 20 minutes in total for aerobic, we discussed importance of increasing to 30 minutes. Reviewed THR, pulse, RPE, sign and symptoms, pulse oximetery and when to call 911 or MD.  Also discussed weather considerations and indoor options.  Pt voiced understanding. Jalisia is doing well in rehab and is getting ready to graduate early this week with about 10-12 sessions. She did complete her post 6MWT and still improved by 17.5%!  She also increased  her treadmill speed to 3.2 mph. We will continue to monitor until patient graduates from [facility]   Expected Outcomes Short: Use RPE daily to regulate intensity. Long: Follow program prescription in THR. Short: Contiue to follow initial exercise prescription Long: Build up overall strength and stamina Short: Continue to increase workloads on treadmill and seated machines. Long: Continue to improve strength and stamina. Short: Increase aerobic exercise to 30 minutes Long: Continue to exercise independently Short: Graduate Long: Continue to increase overall MET level and stamina            Discharge Exercise Prescription (Final Exercise Prescription Changes):  Exercise Prescription Changes - 09/03/22 1100       Response to Exercise   Blood Pressure (Admit) 116/66    Blood Pressure (Exercise) 132/80    Blood Pressure (Exit) 118/62    Heart Rate (Admit) 101 bpm    Heart Rate (Exercise) 133 bpm    Heart Rate (Exit) 96  bpm    Rating of Perceived Exertion (Exercise) 13    Symptoms none    Duration Continue with 30 min of aerobic exercise without signs/symptoms of physical distress.    Intensity THRR unchanged      Progression   Progression Continue to progress workloads to maintain intensity without signs/symptoms of physical distress.    Average METs 3.21      Resistance Training   Training Prescription Yes    Weight 2 lb    Reps 10-15      Interval Training   Interval Training No      Treadmill   MPH 3.2    Grade 2.5    Minutes 15    METs 4.55      NuStep   Level 2    Minutes 15    METs 2.4      REL-XR   Level 2    Minutes 15    METs 3.6      T5 Nustep   Level 1    Minutes 15    METs 1.8      Home Exercise Plan   Plans to continue exercise at Home (comment)   rower, walking, bands, stretching   Frequency Add 3 additional days to program exercise sessions.    Initial Home Exercises Provided 08/21/22      Oxygen   Maintain Oxygen Saturation 88% or higher              Nutrition:  Target Goals: Understanding of nutrition guidelines, daily intake of sodium '1500mg'$ , cholesterol '200mg'$ , calories 30% from fat and 7% or less from saturated fats, daily to have 5 or more servings of fruits and vegetables.  Education: All About Nutrition: -Group instruction provided by verbal, written material, interactive activities, discussions, models, and posters to present general guidelines for heart healthy nutrition including fat, fiber, MyPlate, the role of sodium in heart healthy nutrition, utilization of the nutrition label, and utilization of this knowledge for meal planning. Follow up email sent as well. Written material given at graduation.   Biometrics:  Pre Biometrics - 07/26/22 1257       Pre Biometrics   Height '5\' 4"'$  (1.626 m)    Weight 113 lb 8 oz (51.5 kg)    Waist Circumference 26 inches    Hip Circumference 34.5 inches    Waist to Hip Ratio 0.75 %    BMI (Calculated) 19.47    Single Leg Stand 30 seconds   L            Post Biometrics - 08/30/22 1113        Post  Biometrics   Height '5\' 4"'$  (1.626 m)    Weight 116 lb 1.6 oz (52.7 kg)    Waist Circumference 26 inches    Hip Circumference 36 inches    Waist to Hip Ratio 0.72 %    BMI (Calculated) 19.92    Single Leg Stand 30 seconds             Nutrition Therapy Plan and Nutrition Goals:  Nutrition Therapy & Goals - 08/28/22 1118       Nutrition Therapy   RD appointment deferred Yes   Cadynce would not like to meet with RD at this time as she reports already having heart healthy habits.     Personal Nutrition Goals   Nutrition Goal Sierra Green would not like to meet with RD at this time as she reports already having heart healthy habits.  Intervention Plan   Intervention --    Expected Outcomes --             Nutrition Assessments:  MEDIFICTS Score Key: ?70 Need to make dietary changes  40-70 Heart Healthy Diet ? 40 Therapeutic Level Cholesterol  Diet  Flowsheet Row Cardiac Rehab from 08/30/2022 in [facility] Cardiac and Pulmonary Rehab  Picture Your Plate Total Score on Discharge 68      Picture Your Plate Scores: [id number] Unhealthy dietary pattern with much room for improvement. 41-50 Dietary pattern unlikely to meet recommendations for good health and room for improvement. 51-60 More healthful dietary pattern, with some room for improvement.  >60 Healthy dietary pattern, although there may be some specific behaviors that could be improved.    Nutrition Goals Re-Evaluation:  Nutrition Goals Re-Evaluation     [place] Name 08/16/22 1130             Goals   Current Weight 115 lb (52.2 kg)       Nutrition Goal Gain some weight       Comment Patient does not want to meet with RD.                Nutrition Goals Discharge (Final Nutrition Goals Re-Evaluation):  Nutrition Goals Re-Evaluation - 08/16/22 1130       Goals   Current Weight 115 lb (52.2 kg)    Nutrition Goal Gain some weight    Comment Patient does not want to meet with RD.             Psychosocial: Target Goals: Acknowledge presence or absence of significant depression and/or stress, maximize coping skills, provide positive support system. Participant is able to verbalize types and ability to use techniques and skills needed for reducing stress and depression.   Education: Stress, Anxiety, and Depression - Group verbal and visual presentation to define topics covered.  Reviews how body is impacted by stress, anxiety, and depression.  Also discusses healthy ways to reduce stress and to treat/manage anxiety and depression.  Written material given at graduation.   Education: Sleep Hygiene -Provides group verbal and written instruction about how sleep can affect your health.  Define sleep hygiene, discuss sleep cycles and impact of sleep habits. Review good sleep hygiene tips.    Initial Review & Psychosocial Screening:  Initial Psych Review & Screening -  07/24/22 1310       Initial Review   Current issues with None Identified      Family Dynamics   Good Support System? Yes   husband, son and family, neighbors     Barriers   Psychosocial barriers to participate in program There are no identifiable barriers or psychosocial needs.      Screening Interventions   Interventions Encouraged to exercise    Expected Outcomes Short Term goal: Utilizing psychosocial counselor, staff and physician to assist with identification of specific Stressors or current issues interfering with healing process. Setting desired goal for each stressor or current issue identified.;Long Term goal: The participant improves quality of Life and PHQ9 Scores as seen by post scores and/or verbalization of changes;Short Term goal: Identification and review with participant of any Quality of Life or Depression concerns found by scoring the questionnaire.;Long Term Goal: Stressors or current issues are controlled or eliminated.             Quality of Life Scores:   Quality of Life - 08/30/22 1446       Quality of Life Scores  Health/Function Pre 25.73 %    Health/Function Post 30 %    Health/Function % Change 16.6 %    Socioeconomic Pre 26.25 %    Socioeconomic Post 30 %    Socioeconomic % Change  14.29 %    Psych/Spiritual Pre 28.93 %    Psych/Spiritual Post 29.29 %    Psych/Spiritual % Change 1.24 %    Family Pre 30 %    Family Post 30 %    Family % Change 0 %    GLOBAL Pre 27.1 %    GLOBAL Post 29.83 %    GLOBAL % Change 10.07 %            Scores of 19 and below usually indicate a poorer quality of life in these areas.  A difference of  2-3 points is a clinically meaningful difference.  A difference of 2-3 points in the total score of the Quality of Life Index has been associated with significant improvement in overall quality of life, self-image, physical symptoms, and general health in studies assessing change in quality of life.  PHQ-9: Review  Flowsheet  More data may exist      08/30/2022 07/26/2022 11/28/2016 05/16/2016 12/21/2015  Depression screen PHQ 2/9  Decreased Interest 0 1 0 0 0  Down, Depressed, Hopeless 0 0 0 0 0  PHQ - 2 Score 0 1 0 0 0  Altered sleeping 1 0 - - -  Tired, decreased energy 1 1 - - -  Change in appetite 0 2 - - -  Feeling bad or failure about yourself  0 0 - - -  Trouble concentrating 0 0 - - -  Moving slowly or fidgety/restless 0 0 - - -  Suicidal thoughts 0 0 - - -  PHQ-9 Score 2 4 - - -  Difficult doing work/chores Not difficult at all Somewhat difficult - - -   Interpretation of Total Score  Total Score Depression Severity:  1-4 = Minimal depression, 5-9 = Mild depression, 10-14 = Moderate depression, 15-19 = Moderately severe depression, 20-27 = Severe depression   Psychosocial Evaluation and Intervention:  Psychosocial Evaluation - 07/24/22 1317       Psychosocial Evaluation & Interventions   Interventions Encouraged to exercise with the program and follow exercise prescription    Comments 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 She did develop anemia which she is still taking medication for. They are also working on getting her warfarin stabilized. She is in chronic Afib and is aware that sometimes her heart rate goes up. She reports no stress or sleep concerns. She has a great support system that includes her husband, son and family, and supportive neighbors. She was doing resistance exercises prior to her TAVR and has started walking again. She wants to work on her stamina and strength.    Expected Outcomes Short: attend cardiac rehab for education and exercise. Long: develop and maintain positive self care habits.    Continue Psychosocial Services  Follow up required by staff             Psychosocial Re-Evaluation:  Psychosocial Re-Evaluation     Sierra Green Name 08/16/22 1131             Psychosocial Re-Evaluation   Current issues with None Identified       Comments  Patient reports no issues with their current mental states, sleep, stress, depression or anxiety. Will follow up with patient in a few weeks for any changes.  Expected Outcomes Short: Continue to exercise regularly to support mental health and notify staff of any changes. Long: maintain mental health and well being through teaching of rehab or prescribed medications independently.       Interventions Encouraged to attend Cardiac Rehabilitation for the exercise       Continue Psychosocial Services  Follow up required by staff                Psychosocial Discharge (Final Psychosocial Re-Evaluation):  Psychosocial Re-Evaluation - 08/16/22 1131       Psychosocial Re-Evaluation   Current issues with None Identified    Comments Patient reports no issues with their current mental states, sleep, stress, depression or anxiety. Will follow up with patient in a few weeks for any changes.    Expected Outcomes Short: Continue to exercise regularly to support mental health and notify staff of any changes. Long: maintain mental health and well being through teaching of rehab or prescribed medications independently.    Interventions Encouraged to attend Cardiac Rehabilitation for the exercise    Continue Psychosocial Services  Follow up required by staff             Vocational Rehabilitation: Provide vocational rehab assistance to qualifying candidates.   Vocational Rehab Evaluation & Intervention:  Vocational Rehab - 07/24/22 1320       Initial Vocational Rehab Evaluation & Intervention   Assessment shows need for Vocational Rehabilitation No             Education: Education Goals: Education classes will be provided on a variety of topics geared toward better understanding of heart health and risk factor modification. Participant will state understanding/return demonstration of topics presented as noted by education test scores.  Learning Barriers/Preferences:  Learning  Barriers/Preferences - 07/24/22 1310       Learning Barriers/Preferences   Learning Barriers None    Learning Preferences None             General Cardiac Education Topics:  AED/CPR: - Group verbal and written instruction with the use of models to demonstrate the basic use of the AED with the basic ABC's of resuscitation.   Anatomy and Cardiac Procedures: - Group verbal and visual presentation and models provide information about basic cardiac anatomy and function. Reviews the testing methods done to diagnose heart disease and the outcomes of the test results. Describes the treatment choices: Medical Management, Angioplasty, or Coronary Bypass Surgery for treating various heart conditions including Myocardial Infarction, Angina, Valve Disease, and Cardiac Arrhythmias.  Written material given at graduation. Flowsheet Row Cardiac Rehab from 08/30/2022 in [facility] Cardiac and Pulmonary Rehab  Date 08/09/22  Educator SB  Instruction Review Code 1- Verbalizes Understanding       Medication Safety: - Group verbal and visual instruction to review commonly prescribed medications for heart and lung disease. Reviews the medication, class of the drug, and side effects. Includes the steps to properly store meds and maintain the prescription regimen.  Written material given at graduation.   Intimacy: - Group verbal instruction through game format to discuss how heart and lung disease can affect sexual intimacy. Written material given at graduation..   Know Your Numbers and Heart Failure: - Group verbal and visual instruction to discuss disease risk factors for cardiac and pulmonary disease and treatment options.  Reviews associated critical values for Overweight/Obesity, Hypertension, Cholesterol, and Diabetes.  Discusses basics of heart failure: signs/symptoms and treatments.  Introduces Heart Failure Zone chart for action plan for heart failure.  Written material given at graduation. Flowsheet  Row Cardiac Rehab from 08/30/2022 in [facility] Cardiac and Pulmonary Rehab  Date 08/30/22  Educator [facility]  Instruction Review Code 1- Verbalizes Understanding       Infection Prevention: - Provides verbal and written material to individual with discussion of infection control including proper hand washing and proper equipment cleaning during exercise session. Flowsheet Row Cardiac Rehab from 08/30/2022 in [facility] Cardiac and Pulmonary Rehab  Date 07/26/22  Educator NT  Instruction Review Code 1- Verbalizes Understanding       Falls Prevention: - Provides verbal and written material to individual with discussion of falls prevention and safety. Flowsheet Row Cardiac Rehab from 08/30/2022 in [facility] Cardiac and Pulmonary Rehab  Date 07/26/22  Educator NT  Instruction Review Code 1- Verbalizes Understanding       Other: -Provides group and verbal instruction on various topics (see comments) Flowsheet Row Cardiac Rehab from 08/30/2022 in [facility] Cardiac and Pulmonary Rehab  Date 08/16/22  Educator SB  Instruction Review Code 1- Verbalizes Understanding       Knowledge Questionnaire Score:  Knowledge Questionnaire Score - 08/30/22 1446       Knowledge Questionnaire Score   Pre Score 23/26    Post Score 25/26             Core Components/Risk Factors/Patient Goals at Admission:  Personal Goals and Risk Factors at Admission - 07/26/22 1108       Core Components/Risk Factors/Patient Goals on Admission    Weight Management Yes;Weight Gain    Intervention Weight Management: Develop a combined nutrition and exercise program designed to reach desired caloric intake, while maintaining appropriate intake of nutrient and fiber, sodium and fats, and appropriate energy expenditure required for the weight goal.;Weight Management: Provide education and appropriate resources to help participant work on and attain dietary goals.    Admit Weight 113 lb 8 oz (51.5 kg)    Goal Weight: Short Term 115 lb  (52.2 kg)    Goal Weight: Long Term 120 lb (54.4 kg)    Expected Outcomes Long Term: Adherence to nutrition and physical activity/exercise program aimed toward attainment of established weight goal;Short Term: Continue to assess and modify interventions until short term weight is achieved;Understanding recommendations for meals to include 15-35% energy as protein, 25-35% energy from fat, 35-60% energy from carbohydrates, less than '200mg'$  of dietary cholesterol, 20-35 gm of total fiber daily;Understanding of distribution of calorie intake throughout the day with the consumption of 4-5 meals/snacks;Weight Gain: Understanding of general recommendations for a high calorie, high protein meal plan that promotes weight gain by distributing calorie intake throughout the day with the consumption for 4-5 meals, snacks, and/or supplements    Hypertension Yes    Intervention Provide education on lifestyle modifcations including regular physical activity/exercise, weight management, moderate sodium restriction and increased consumption of fresh fruit, vegetables, and low fat dairy, alcohol moderation, and smoking cessation.;Monitor prescription use compliance.    Expected Outcomes Short Term: Continued assessment and intervention until BP is < 140/56m HG in hypertensive participants. < 130/872mHG in hypertensive participants with diabetes, heart failure or chronic kidney disease.;Long Term: Maintenance of blood pressure at goal levels.             Education:Diabetes - Individual verbal and written instruction to review signs/symptoms of diabetes, desired ranges of glucose level fasting, after meals and with exercise. Acknowledge that pre and post exercise glucose checks will be done for 3 sessions at entry of program.   Core  Components/Risk Factors/Patient Goals Review:   Goals and Risk Factor Review     Row Name 08/16/22 1127             Core Components/Risk Factors/Patient Goals Review   Personal  Goals Review Weight Management/Obesity;Other       Review Sierra Green has been doing well in tin the program. She does not have any questions about her medications. She would like to gain a little weight and some muscle. Blood pressures have been stable and she checks her blood pressure at home occasionaly.       Expected Outcomes Short: gain a few poinds in a few months. Long: maintain weight gain.                Core Components/Risk Factors/Patient Goals at Discharge (Final Review):   Goals and Risk Factor Review - 08/16/22 1127       Core Components/Risk Factors/Patient Goals Review   Personal Goals Review Weight Management/Obesity;Other    Review Sierra Green has been doing well in tin the program. She does not have any questions about her medications. She would like to gain a little weight and some muscle. Blood pressures have been stable and she checks her blood pressure at home occasionaly.    Expected Outcomes Short: gain a few poinds in a few months. Long: maintain weight gain.             ITP Comments:  ITP Comments     Row Name 07/24/22 1321 07/26/22 1101 08/02/22 1128 08/22/22 1421 09/06/22 1134   ITP Comments Initial phone call completed. Diagnosis can be found in [facility] 1/7. EP Orientation scheduled for Thursday 1/25 at 9:30am. Completed 6MWT and gym orientation. Initial ITP created and sent for review to Dr. Emily Filbert, Medical Director. First full day of exercise!  Patient was oriented to gym and equipment including functions, settings, policies, and procedures.  Patient's individual exercise prescription and treatment plan were reviewed.  All starting workloads were established based on the results of the 6 minute walk test done at initial orientation visit.  The plan for exercise progression was also introduced and progression will be customized based on patient's performance and goals. 30 day review completed. ITP sent to Dr. Emily Filbert, Medical Director of Cardiac Rehab.  Continue with ITP unless changes are made by physician. Kao graduated today from  rehab with 14 sessions completed.  Details of the patient's exercise prescription and what She needs to do in order to continue the prescription and progress were discussed with patient.  Patient was given a copy of prescription and goals.  Patient verbalized understanding.  Sierra Green plans to continue to exercise by rower, walking, bands, and stretching.            Comments: Discharge ITP

## 2022-09-06 NOTE — Progress Notes (Signed)
Daily Session Note  Patient Details  Name: Sierra Green MRN: [id number] Date of Birth: 1940/10/31 Referring Provider:   Flowsheet Row Cardiac Rehab from 07/26/2022 in [facility] Cardiac and Pulmonary Rehab  Referring Provider Dr. Thereasa Distance MD       Encounter Date: 09/06/2022  Check In:  Session Check In - 09/06/22 1132       Check-In   Supervising physician immediately available to respond to emergencies See telemetry face sheet for immediately available ER MD    Location [facility]-Cardiac & Pulmonary Rehab    Staff Present Darlyne Russian, RN, ADN;Meredith Sherryll Burger, RN BSN;Jessica Luan Pulling, MA, RCEP, CCRP, Bertram Gala, MS, ACSM CEP, Exercise Physiologist    Virtual Visit No    Medication changes reported     No    Fall or balance concerns reported    No    Warm-up and Cool-down Performed on first and last piece of equipment    Resistance Training Performed Yes    VAD Patient? No    PAD/SET Patient? No      Pain Assessment   Currently in Pain? No/denies                Social History   Tobacco Use  Smoking Status Former   Types: Cigarettes   Quit date: 07/02/1966   Years since quitting: 56.2  Smokeless Tobacco Never    Goals Met:  Independence with exercise equipment Exercise tolerated well No report of concerns or symptoms today Strength training completed today  Goals Unmet:  Not Applicable  Comments:  Tim graduated today from  rehab with 14 sessions completed.  Details of the patient's exercise prescription and what She needs to do in order to continue the prescription and progress were discussed with patient.  Patient was given a copy of prescription and goals.  Patient verbalized understanding.  Lynnda plans to continue to exercise by rower, walking, bands, and stretching.    Dr. Emily Filbert is Medical Director for [facility].  Dr. Ottie Glazier is Medical Director for [facility] Pulmonary Rehabilitation.

## 2022-09-06 NOTE — Progress Notes (Signed)
Discharge Summary: Sierra Green (DOB: 13-Jul-1940)  Sierra Green graduated today from  rehab with 14 sessions completed.  Details of the patient's exercise prescription and what She needs to do in order to continue the prescription and progress were discussed with patient.  Patient was given a copy of prescription and goals.  Patient verbalized understanding.  Kesa plans to continue to exercise by rower, walking, bands, and stretching.   [place] Name 07/26/22 1301 08/30/22 1112       6 Minute Walk   Phase Initial Discharge    Distance 1345 feet 1580 feet    Distance % Change -- 17.5 %    Distance Feet Change -- 235 ft    Walk Time 6 minutes 6 minutes    # of Rest Breaks 0 0    MPH 2.55 2.99    METS 2.89 3.38    RPE 9 13    Perceived Dyspnea  0 --    VO2 Peak 10.1 11.84    Symptoms No No    Resting HR 88 bpm 102 bpm    Resting BP 108/60 116/66    Resting Oxygen Saturation  100 % --    Exercise Oxygen Saturation  during 6 min walk 99 % --    Max Ex. HR 121 bpm 125 bpm    Max Ex. BP 124/66 132/80    2 Minute Post BP 104/58 --

## 2022-11-21 ENCOUNTER — Ambulatory Visit: Payer: Medicare HMO | Admitting: Dermatology

## 2022-11-21 ENCOUNTER — Encounter: Payer: Self-pay | Admitting: Dermatology

## 2022-11-21 VITALS — BP 118/76

## 2022-11-21 DIAGNOSIS — L578 Other skin changes due to chronic exposure to nonionizing radiation: Secondary | ICD-10-CM | POA: Diagnosis not present

## 2022-11-21 DIAGNOSIS — X32XXXA Exposure to sunlight, initial encounter: Secondary | ICD-10-CM

## 2022-11-21 DIAGNOSIS — W908XXA Exposure to other nonionizing radiation, initial encounter: Secondary | ICD-10-CM | POA: Diagnosis not present

## 2022-11-21 DIAGNOSIS — L821 Other seborrheic keratosis: Secondary | ICD-10-CM

## 2022-11-21 DIAGNOSIS — Z872 Personal history of diseases of the skin and subcutaneous tissue: Secondary | ICD-10-CM

## 2022-11-21 NOTE — Patient Instructions (Signed)
Recommend taking Heliocare sun protection supplement daily in sunny weather for additional sun protection. For maximum protection on the sunniest days, you can take up to 2 capsules of regular Heliocare OR take 1 capsule of Heliocare Ultra. For prolonged exposure (such as a full day in the sun), you can repeat your dose of the supplement 4 hours after your first dose. Heliocare can be purchased at [facility], at some [facility] or at [email or web address].    Melanoma ABCDEs  Melanoma is the most dangerous type of skin cancer, and is the leading cause of death from skin disease.  You are more likely to develop melanoma if you: Have light-colored skin, light-colored eyes, or red or blond hair Spend a lot of time in the sun Tan regularly, either outdoors or in a tanning bed Have had blistering sunburns, especially during childhood Have a close family member who has had a melanoma Have atypical moles or large birthmarks  Early detection of melanoma is key since treatment is typically straightforward and cure rates are extremely high if we catch it early.   The first sign of melanoma is often a change in a mole or a new dark spot.  The ABCDE system is a way of remembering the signs of melanoma.  A for asymmetry:  The two halves do not match. B for border:  The edges of the growth are irregular. C for color:  A mixture of colors are present instead of an even brown color. D for diameter:  Melanomas are usually (but not always) greater than 6mm - the size of a pencil eraser. E for evolution:  The spot keeps changing in size, shape, and color.  Please check your skin once per month between visits. You can use a small mirror in front and a large mirror behind you to keep an eye on the back side or your body.   If you see any new or changing lesions before your next follow-up, please call to schedule a visit.  Please continue daily skin protection including broad spectrum sunscreen SPF 30+ to  sun-exposed areas, reapplying every 2 hours as needed when you're outdoors.    Due to recent changes in healthcare laws, you may see results of your pathology and/or laboratory studies on MyChart before the doctors have had a chance to review them. We understand that in some cases there may be results that are confusing or concerning to you. Please understand that not all results are received at the same time and often the doctors may need to interpret multiple results in order to provide you with the best plan of care or course of treatment. Therefore, we ask that you please give us 2 business days to thoroughly review all your results before contacting the office for clarification. Should we see a critical lab result, you will be contacted sooner.   If You Need Anything After Your Visit  If you have any questions or concerns for your doctor, please call our main line at [phone] and press option 4 to reach your doctor's medical assistant. If no one answers, please leave a voicemail as directed and we will return your call as soon as possible. Messages left after 4 pm will be answered the following business day.   You may also send us a message via MyChart. We typically respond to MyChart messages within 1-2 business days.  For prescription refills, please ask your pharmacy to contact our office. Our fax number is [phone].  If you have   an urgent issue when the clinic is closed that cannot wait until the next business day, you can page your doctor at the number below.    Please note that while we do our best to be available for urgent issues outside of office hours, we are not available 24/7.   If you have an urgent issue and are unable to reach us, you may choose to seek medical care at your doctor's office, retail clinic, urgent care center, or emergency room.  If you have a medical emergency, please immediately call 911 or go to the emergency department.  Pager Numbers  - Dr.  Kowalski: [phone]  - Dr. Moye: [phone]  - Dr. Stewart: [phone]  In the event of inclement weather, please call our main line at [phone] for an update on the status of any delays or closures.  Dermatology Medication Tips: Please keep the boxes that topical medications come in in order to help keep track of the instructions about where and how to use these. Pharmacies typically print the medication instructions only on the boxes and not directly on the medication tubes.   If your medication is too expensive, please contact our office at [phone] option 4 or send us a message through MyChart.   We are unable to tell what your co-pay for medications will be in advance as this is different depending on your insurance coverage. However, we may be able to find a substitute medication at lower cost or fill out paperwork to get insurance to cover a needed medication.   If a prior authorization is required to get your medication covered by your insurance company, please allow us 1-2 business days to complete this process.  Drug prices often vary depending on where the prescription is filled and some pharmacies may offer cheaper prices.  The website www.goodrx.com contains coupons for medications through different pharmacies. The prices here do not account for what the cost may be with help from insurance (it may be cheaper with your insurance), but the website can give you the price if you did not use any insurance.  - You can print the associated coupon and take it with your prescription to the pharmacy.  - You may also stop by our office during regular business hours and pick up a GoodRx coupon card.  - If you need your prescription sent electronically to a different pharmacy, notify our office through [place] MyChart or by phone at [phone] option 4.     Si Usted Necesita Algo Despus de Su Visita  Tambin puede enviarnos un mensaje a travs de MyChart. Por lo  general respondemos a los mensajes de MyChart en el transcurso de 1 a 2 das hbiles.  Para renovar recetas, por favor pida a su farmacia que se ponga en contacto con nuestra oficina. Nuestro nmero de fax es el [phone].  Si tiene un asunto urgente cuando la clnica est cerrada y que no puede esperar hasta el siguiente da hbil, puede llamar/localizar a su doctor(a) al nmero que aparece a continuacin.   Por favor, tenga en cuenta que aunque hacemos todo lo posible para estar disponibles para asuntos urgentes fuera del horario de oficina, no estamos disponibles las 24 horas del da, los 7 das de la semana.   Si tiene un problema urgente y no puede comunicarse con nosotros, puede optar por buscar atencin mdica  en el consultorio de su doctor(a), en una clnica privada, en un centro de atencin urgente o en una sala de   emergencias.  Si tiene una emergencia mdica, por favor llame inmediatamente al 911 o vaya a la sala de emergencias.  Nmeros de bper  - Dr. Kowalski: [phone]  - Dra. Moye: [phone]  - Dra. Stewart: [phone]  En caso de inclemencias del tiempo, por favor llame a nuestra lnea principal al [phone] para una actualizacin sobre el estado de cualquier retraso o cierre.  Consejos para la medicacin en dermatologa: Por favor, guarde las cajas en las que vienen los medicamentos de uso tpico para ayudarle a seguir las instrucciones sobre dnde y cmo usarlos. Las farmacias generalmente imprimen las instrucciones del medicamento slo en las cajas y no directamente en los tubos del medicamento.   Si su medicamento es muy caro, por favor, pngase en contacto con nuestra oficina llamando al [phone] y presione la opcin 4 o envenos un mensaje a travs de MyChart.   No podemos decirle cul ser su copago por los medicamentos por adelantado ya que esto es diferente dependiendo de la cobertura de su seguro. Sin embargo, es posible que podamos encontrar un  medicamento sustituto a menor costo o llenar un formulario para que el seguro cubra el medicamento que se considera necesario.   Si se requiere una autorizacin previa para que su compaa de seguros cubra su medicamento, por favor permtanos de 1 a 2 das hbiles para completar este proceso.  Los precios de los medicamentos varan con frecuencia dependiendo del lugar de dnde se surte la receta y alguna farmacias pueden ofrecer precios ms baratos.  El sitio web www.goodrx.com tiene cupones para medicamentos de diferentes farmacias. Los precios aqu no tienen en cuenta lo que podra costar con la ayuda del seguro (puede ser ms barato con su seguro), pero el sitio web puede darle el precio si no utiliz ningn seguro.  - Puede imprimir el cupn correspondiente y llevarlo con su receta a la farmacia.  - Tambin puede pasar por nuestra oficina durante el horario de atencin regular y recoger una tarjeta de cupones de GoodRx.  - Si necesita que su receta se enve electrnicamente a una farmacia diferente, informe a nuestra oficina a travs de MyChart de [place] o por telfono llamando al [phone] y presione la opcin 4.  

## 2022-11-21 NOTE — Progress Notes (Unsigned)
   Follow-Up Visit   Subjective  Sierra Green is a 82 y.o. female who presents for the following: 3 month AK/ISK follow up. Patient advises she did well with LN2 and areas have cleared.   The following portions of the chart were reviewed this encounter and updated as appropriate: medications, allergies, medical history  Review of Systems:  No other skin or systemic complaints except as noted in HPI or Assessment and Plan.  Objective  Well appearing patient in no apparent distress; mood and affect are within normal limits.   A focused examination was performed of the following areas: Face, arms  Relevant exam findings are noted in the Assessment and Plan.    Assessment & Plan   SEBORRHEIC KERATOSIS - Stuck-on, waxy, tan-brown papules and/or plaques  - Benign-appearing - Discussed benign etiology and prognosis. - Observe - Call for any changes  ACTINIC DAMAGE - chronic, secondary to cumulative UV radiation exposure/sun exposure over time - diffuse scaly erythematous macules with underlying dyspigmentation - Recommend daily broad spectrum sunscreen SPF 30+ to sun-exposed areas, reapply every 2 hours as needed.  - Recommend staying in the shade or wearing long sleeves, sun glasses (UVA+UVB protection) and wide brim hats (4-inch brim around the entire circumference of the hat). - Call for new or changing lesions.  HISTORY OF PRECANCEROUS ACTINIC KERATOSIS - site(s) of PreCancerous Actinic Keratosis clear today. - these may recur and new lesions may form requiring treatment to prevent transformation into skin cancer - observe for new or changing spots and contact [facility] for appointment if occur - photoprotection with sun protective clothing; sunglasses and broad spectrum sunscreen with SPF of at least 30 + and frequent self skin exams recommended - yearly exams by a dermatologist recommended for persons with history of PreCancerous Actinic  Keratoses   Return for TBSE, as scheduled, Hx BCC, Hx AK.  Anise Salvo, RMA, am acting as scribe for Darden Dates, MD .   Documentation: I have reviewed the above documentation for accuracy and completeness, and I agree with the above.  Darden Dates, MD

## 2023-08-29 ENCOUNTER — Ambulatory Visit (INDEPENDENT_AMBULATORY_CARE_PROVIDER_SITE_OTHER): Payer: Medicare HMO | Admitting: Dermatology

## 2023-08-29 ENCOUNTER — Encounter: Payer: Self-pay | Admitting: Dermatology

## 2023-08-29 DIAGNOSIS — L720 Epidermal cyst: Secondary | ICD-10-CM

## 2023-08-29 DIAGNOSIS — Z1283 Encounter for screening for malignant neoplasm of skin: Secondary | ICD-10-CM

## 2023-08-29 DIAGNOSIS — W908XXA Exposure to other nonionizing radiation, initial encounter: Secondary | ICD-10-CM | POA: Diagnosis not present

## 2023-08-29 DIAGNOSIS — D1801 Hemangioma of skin and subcutaneous tissue: Secondary | ICD-10-CM

## 2023-08-29 DIAGNOSIS — L578 Other skin changes due to chronic exposure to nonionizing radiation: Secondary | ICD-10-CM

## 2023-08-29 DIAGNOSIS — Z85828 Personal history of other malignant neoplasm of skin: Secondary | ICD-10-CM

## 2023-08-29 DIAGNOSIS — L570 Actinic keratosis: Secondary | ICD-10-CM | POA: Diagnosis not present

## 2023-08-29 DIAGNOSIS — L814 Other melanin hyperpigmentation: Secondary | ICD-10-CM | POA: Diagnosis not present

## 2023-08-29 DIAGNOSIS — L821 Other seborrheic keratosis: Secondary | ICD-10-CM

## 2023-08-29 DIAGNOSIS — D229 Melanocytic nevi, unspecified: Secondary | ICD-10-CM

## 2023-08-29 NOTE — Patient Instructions (Addendum)

## 2023-08-29 NOTE — Progress Notes (Signed)
 Follow-Up Visit   Subjective  Sierra Green is a 83 y.o. female who presents for the following: Skin Cancer Screening and Full Body Skin Exam  The patient presents for Total-Body Skin Exam (TBSE) for skin cancer screening and mole check. The patient has spots, moles and lesions to be evaluated, some may be new or changing and the patient may have concern these could be cancer. She has a spot at the left upper thigh/inferior buttocks to check, area where she was possibly stuck with a thorn. History of BCC, Aks.  The following portions of the chart were reviewed this encounter and updated as appropriate: medications, allergies, medical history  Review of Systems:  No other skin or systemic complaints except as noted in HPI or Assessment and Plan.  Objective  Well appearing patient in no apparent distress; mood and affect are within normal limits.  A full examination was performed including scalp, head, eyes, ears, nose, lips, neck, chest, axillae, abdomen, back, buttocks, bilateral upper extremities, bilateral lower extremities, hands, feet, fingers, toes, fingernails, and toenails. All findings within normal limits unless otherwise noted below.   Relevant physical exam findings are noted in the Assessment and Plan.  Scalp Erythematous thin papules/macules with gritty scale.   Assessment & Plan   SKIN CANCER SCREENING PERFORMED TODAY.  ACTINIC DAMAGE - Chronic condition, secondary to cumulative UV/sun exposure - diffuse scaly erythematous macules with underlying dyspigmentation - Recommend daily broad spectrum sunscreen SPF 30+ to sun-exposed areas, reapply every 2 hours as needed.  - Staying in the shade or wearing long sleeves, sun glasses (UVA+UVB protection) and wide brim hats (4-inch brim around the entire circumference of the hat) are also recommended for sun protection.  - Call for new or changing lesions.  LENTIGINES, SEBORRHEIC KERATOSES, HEMANGIOMAS - Benign normal  skin lesions - Benign-appearing - Call for any changes  MELANOCYTIC NEVI - Tan-brown and/or pink-flesh-colored symmetric macules and papules - Benign appearing on exam today - Observation - Call clinic for new or changing moles - Recommend daily use of broad spectrum spf 30+ sunscreen to sun-exposed areas.   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  - Call if any new or changing lesions are noted between office visits  EPIDERMAL INCLUSION CYST Exam: 12 mm subcutaneous doughy nodule at left inferior buttock  Benign-appearing. Exam most consistent with an epidermal inclusion cyst. Discussed that a cyst is a benign growth that can grow over time and sometimes get irritated or inflamed. Recommend observation if it is not bothersome. Discussed option of surgical excision to remove it if it is growing, symptomatic, or other changes noted. Please call for new or changing lesions so they can be evaluated. Patient defers treatment for now.   AK (ACTINIC KERATOSIS) Scalp Actinic keratoses are precancerous spots that appear secondary to cumulative UV radiation exposure/sun exposure over time. They are chronic with expected duration over 1 year. A portion of actinic keratoses will progress to squamous cell carcinoma of the skin. It is not possible to reliably predict which spots will progress to skin cancer and so treatment is recommended to prevent development of skin cancer.  Recommend daily broad spectrum sunscreen SPF 30+ to sun-exposed areas, reapply every 2 hours as needed.  Recommend staying in the shade or wearing long sleeves, sun  glasses (UVA+UVB protection) and wide brim hats (4-inch brim around the entire circumference of the hat). Call for new or changing  lesions. Destruction of lesion - Scalp Complexity: simple   Destruction method: cryotherapy   Informed consent: discussed and consent obtained   Timeout:  patient name, date of birth, surgical site, and procedure verified Lesion destroyed using liquid nitrogen: Yes   Region frozen until ice ball extended beyond lesion: Yes   Cryo cycles: 1 or 2. Outcome: patient tolerated procedure well with no complications   Post-procedure details: wound care instructions given   MULTIPLE BENIGN NEVI   LENTIGINES   ACTINIC ELASTOSIS   SEBORRHEIC KERATOSES   CHERRY ANGIOMA   EIC (EPIDERMAL INCLUSION CYST)   Return in about 1 year (around 08/28/2024) for TBSE, Hx BCC, Hx AKs.  Wendee Beavers, CMA, am acting as scribe for Elie Goody, MD .   Documentation: I have reviewed the above documentation for accuracy and completeness, and I agree with the above.  Elie Goody, MD

## 2024-07-30 ENCOUNTER — Ambulatory Visit
Admission: RE | Admit: 2024-07-30 | Discharge: 2024-07-30 | Disposition: A | Discharge status: Home / Self Care | Source: Ambulatory Visit | Attending: Family Medicine | Admitting: Family Medicine

## 2024-07-30 ENCOUNTER — Other Ambulatory Visit: Payer: Self-pay | Admitting: Family Medicine

## 2024-07-30 DIAGNOSIS — M5416 Radiculopathy, lumbar region: Secondary | ICD-10-CM

## 2024-08-27 ENCOUNTER — Ambulatory Visit: Payer: Medicare HMO | Admitting: Dermatology
